# Patient Record
Sex: Male | Born: 1935 | Race: White | Hispanic: No | Marital: Married | Smoking: Never smoker
Health system: Southern US, Community
[De-identification: ages and names within clinical notes are randomized; demographics above are authoritative.]

## PROBLEM LIST (undated history)

## (undated) DIAGNOSIS — R001 Bradycardia, unspecified: Secondary | ICD-10-CM

## (undated) DIAGNOSIS — I10 Essential (primary) hypertension: Secondary | ICD-10-CM

## (undated) DIAGNOSIS — N400 Enlarged prostate without lower urinary tract symptoms: Secondary | ICD-10-CM

## (undated) DIAGNOSIS — I2510 Atherosclerotic heart disease of native coronary artery without angina pectoris: Secondary | ICD-10-CM

## (undated) DIAGNOSIS — I493 Ventricular premature depolarization: Secondary | ICD-10-CM

## (undated) DIAGNOSIS — E039 Hypothyroidism, unspecified: Secondary | ICD-10-CM

## (undated) DIAGNOSIS — R7303 Prediabetes: Secondary | ICD-10-CM

## (undated) DIAGNOSIS — E785 Hyperlipidemia, unspecified: Secondary | ICD-10-CM

## (undated) DIAGNOSIS — E7800 Pure hypercholesterolemia, unspecified: Secondary | ICD-10-CM

## (undated) DIAGNOSIS — R002 Palpitations: Secondary | ICD-10-CM

## (undated) DIAGNOSIS — I255 Ischemic cardiomyopathy: Secondary | ICD-10-CM

## (undated) DIAGNOSIS — I509 Heart failure, unspecified: Secondary | ICD-10-CM

## (undated) DIAGNOSIS — I639 Cerebral infarction, unspecified: Secondary | ICD-10-CM

## (undated) DIAGNOSIS — I25110 Atherosclerotic heart disease of native coronary artery with unstable angina pectoris: Secondary | ICD-10-CM

## (undated) DIAGNOSIS — I208 Other forms of angina pectoris: Secondary | ICD-10-CM

## (undated) DIAGNOSIS — Z8673 Personal history of transient ischemic attack (TIA), and cerebral infarction without residual deficits: Secondary | ICD-10-CM

## (undated) DIAGNOSIS — I63549 Cerebral infarction due to unspecified occlusion or stenosis of unspecified cerebellar artery: Secondary | ICD-10-CM

## (undated) DIAGNOSIS — I2089 Other forms of angina pectoris: Secondary | ICD-10-CM

## (undated) DIAGNOSIS — R972 Elevated prostate specific antigen [PSA]: Secondary | ICD-10-CM

## (undated) HISTORY — DX: Cerebral infarction, unspecified: I63.9

## (undated) HISTORY — DX: Atherosclerotic heart disease of native coronary artery with unstable angina pectoris: I25.110

## (undated) HISTORY — DX: Prediabetes: R73.03

## (undated) HISTORY — DX: Personal history of transient ischemic attack (TIA), and cerebral infarction without residual deficits: Z86.73

## (undated) HISTORY — DX: Hyperlipidemia, unspecified: E78.5

## (undated) HISTORY — DX: Elevated prostate specific antigen (PSA): R97.20

## (undated) HISTORY — DX: Pure hypercholesterolemia, unspecified: E78.00

## (undated) HISTORY — DX: Bradycardia, unspecified: R00.1

## (undated) HISTORY — DX: Other forms of angina pectoris: I20.89

## (undated) HISTORY — DX: Benign prostatic hyperplasia without lower urinary tract symptoms: N40.0

## (undated) HISTORY — DX: Heart failure, unspecified: I50.9

## (undated) HISTORY — DX: Atherosclerotic heart disease of native coronary artery without angina pectoris: I25.10

## (undated) HISTORY — DX: Palpitations: R00.2

## (undated) HISTORY — DX: Essential (primary) hypertension: I10

## (undated) HISTORY — DX: Ischemic cardiomyopathy: I25.5

## (undated) HISTORY — DX: Hypothyroidism, unspecified: E03.9

## (undated) HISTORY — DX: Ventricular premature depolarization: I49.3

## (undated) HISTORY — DX: Other forms of angina pectoris: I20.8

## (undated) HISTORY — DX: Cerebral infarction due to unspecified occlusion or stenosis of unspecified cerebellar artery: I63.549

---

## 1992-06-22 HISTORY — PX: HERNIA REPAIR: SHX51

## 2007-09-23 DIAGNOSIS — I639 Cerebral infarction, unspecified: Secondary | ICD-10-CM

## 2007-09-23 HISTORY — DX: Cerebral infarction, unspecified: I63.9

## 2009-10-06 ENCOUNTER — Inpatient Hospital Stay (HOSPITAL_COMMUNITY): Admission: EM | Admit: 2009-10-06 | Discharge: 2009-10-08 | Payer: Self-pay | Admitting: Emergency Medicine

## 2009-10-08 ENCOUNTER — Encounter (INDEPENDENT_AMBULATORY_CARE_PROVIDER_SITE_OTHER): Payer: Self-pay | Admitting: Internal Medicine

## 2010-12-08 LAB — URINE MICROSCOPIC-ADD ON

## 2010-12-08 LAB — DIFFERENTIAL
Basophils Absolute: 0 10*3/uL (ref 0.0–0.1)
Eosinophils Absolute: 0 10*3/uL (ref 0.0–0.7)
Eosinophils Relative: 0 % (ref 0–5)
Monocytes Absolute: 0.5 10*3/uL (ref 0.1–1.0)
Monocytes Relative: 4 % (ref 3–12)

## 2010-12-08 LAB — CBC
HCT: 43.7 % (ref 39.0–52.0)
Hemoglobin: 15.1 g/dL (ref 13.0–17.0)
MCHC: 34.5 g/dL (ref 30.0–36.0)
RBC: 4.9 MIL/uL (ref 4.22–5.81)
RDW: 13.4 % (ref 11.5–15.5)
WBC: 12.9 10*3/uL — ABNORMAL HIGH (ref 4.0–10.5)

## 2010-12-08 LAB — URINALYSIS, ROUTINE W REFLEX MICROSCOPIC
Glucose, UA: 100 mg/dL — AB
Ketones, ur: 15 mg/dL — AB
Nitrite: POSITIVE — AB
Protein, ur: 100 mg/dL — AB
Specific Gravity, Urine: 1.017 (ref 1.005–1.030)

## 2010-12-08 LAB — COMPREHENSIVE METABOLIC PANEL
ALT: 26 U/L (ref 0–53)
CO2: 26 mEq/L (ref 19–32)
Calcium: 8.7 mg/dL (ref 8.4–10.5)
Creatinine, Ser: 1.13 mg/dL (ref 0.4–1.5)
GFR calc Af Amer: >60 mL/min (ref >60)
Glucose, Bld: 106 mg/dL — ABNORMAL HIGH (ref 70–99)
Total Protein: 6.9 g/dL (ref 6.0–8.3)

## 2010-12-08 LAB — CULTURE, BLOOD (ROUTINE X 2): Culture: NO GROWTH

## 2010-12-08 LAB — URINE CULTURE

## 2010-12-08 LAB — LACTIC ACID, PLASMA: Lactic Acid, Venous: 1.7 mmol/L (ref 0.5–2.2)

## 2010-12-08 LAB — LIPASE, BLOOD: Lipase: 25 U/L (ref 11–59)

## 2010-12-08 LAB — APTT: aPTT: 27 seconds (ref 24–37)

## 2010-12-08 LAB — TYPE AND SCREEN
ABO/RH(D): O POS
Antibody Screen: NEGATIVE

## 2010-12-08 LAB — PROTIME-INR: INR: 1.08 (ref 0.00–1.49)

## 2010-12-08 LAB — POCT CARDIAC MARKERS

## 2010-12-09 LAB — CBC
HCT: 41.6 % (ref 39.0–52.0)
Hemoglobin: 12.6 g/dL — ABNORMAL LOW (ref 13.0–17.0)
Hemoglobin: 13 g/dL (ref 13.0–17.0)
Hemoglobin: 13.2 g/dL (ref 13.0–17.0)
MCHC: 33.6 g/dL (ref 30.0–36.0)
MCHC: 34 g/dL (ref 30.0–36.0)
MCHC: 34.2 g/dL (ref 30.0–36.0)
MCV: 89.4 fL (ref 78.0–100.0)
MCV: 89.9 fL (ref 78.0–100.0)
MCV: 90.3 fL (ref 78.0–100.0)
Platelets: 115 10*3/uL — ABNORMAL LOW (ref 150–400)
Platelets: 127 10*3/uL — ABNORMAL LOW (ref 150–400)
Platelets: 130 10*3/uL — ABNORMAL LOW (ref 150–400)
Platelets: 131 10*3/uL — ABNORMAL LOW (ref 150–400)
RBC: 4.61 MIL/uL (ref 4.22–5.81)
RDW: 13.9 % (ref 11.5–15.5)
RDW: 14.1 % (ref 11.5–15.5)
WBC: 4.3 10*3/uL (ref 4.0–10.5)

## 2010-12-09 LAB — BASIC METABOLIC PANEL
BUN: 12 mg/dL (ref 6–23)
Calcium: 7.9 mg/dL — ABNORMAL LOW (ref 8.4–10.5)
Creatinine, Ser: 1.13 mg/dL (ref 0.4–1.5)
GFR calc Af Amer: >60 mL/min (ref >60)
Glucose, Bld: 106 mg/dL — ABNORMAL HIGH (ref 70–99)
Potassium: 3.9 mEq/L (ref 3.5–5.1)

## 2010-12-20 ENCOUNTER — Encounter: Payer: Self-pay | Admitting: Family Medicine

## 2010-12-20 ENCOUNTER — Ambulatory Visit (INDEPENDENT_AMBULATORY_CARE_PROVIDER_SITE_OTHER): Payer: Medicare Other | Admitting: Family Medicine

## 2010-12-20 DIAGNOSIS — I10 Essential (primary) hypertension: Secondary | ICD-10-CM

## 2010-12-20 DIAGNOSIS — Z8673 Personal history of transient ischemic attack (TIA), and cerebral infarction without residual deficits: Secondary | ICD-10-CM | POA: Insufficient documentation

## 2010-12-20 DIAGNOSIS — I499 Cardiac arrhythmia, unspecified: Secondary | ICD-10-CM

## 2010-12-20 DIAGNOSIS — R030 Elevated blood-pressure reading, without diagnosis of hypertension: Secondary | ICD-10-CM

## 2010-12-20 DIAGNOSIS — L0100 Impetigo, unspecified: Secondary | ICD-10-CM

## 2010-12-20 HISTORY — DX: Personal history of transient ischemic attack (TIA), and cerebral infarction without residual deficits: Z86.73

## 2010-12-20 HISTORY — DX: Essential (primary) hypertension: I10

## 2010-12-20 LAB — LIPID PANEL
Cholesterol: 199 mg/dL (ref 0–200)
Total CHOL/HDL Ratio: 5 Ratio

## 2010-12-20 MED ORDER — ASPIRIN EC 81 MG PO TBEC: 81.0000 mg | DELAYED_RELEASE_TABLET | Freq: Every day | ORAL | Status: AC

## 2010-12-20 MED ORDER — MUPIROCIN 2 % EX OINT: TOPICAL_OINTMENT | CUTANEOUS | Status: DC

## 2010-12-20 NOTE — Patient Instructions (Addendum)
Take you BP at home multiple times and record.  Bring in to me next visit.  Goal is to average 130/80 or less on home BPs I will call with lab results Start taking one aspirin daily. See me soon for complete physical (and story about carotids).  Make sure you bring in your BP readings.

## 2010-12-20 NOTE — Progress Notes (Signed)
  Subjective:    Patient ID: Patrick Maxwell, male    DOB: 04-May-1931, 77 y.o.   MRN: [id number]  HPI Here to establish care with new MD.   Very light stroke 3 years ago.  Little to no residual.  Perhaps some memory problems.  Did have some speech effected but now close to normal.   Cholesterol historically good.  It has been over one year.     Review of Systems Denies chest pain, SOB, syncope, or new neuro deficits.  No unusual wt change.  No bleeding.  Does have irritated skin lesion on neck.  Also C/O rare mild palpitations without syncope or lightheadedness.     Objective:   Physical Exam Neck lesion, small area uncertain primary lesion but now looks secondarily infected. Neck otherwise normal Lungs clear Cardiac RRR without m or g Abd benign Ext no edema       Assessment & Plan:

## 2010-12-23 ENCOUNTER — Encounter: Payer: Self-pay | Admitting: Family Medicine

## 2010-12-24 ENCOUNTER — Encounter: Payer: Self-pay | Admitting: Family Medicine

## 2010-12-24 NOTE — Assessment & Plan Note (Signed)
Should be on ASA daily.  Also, goal LDL<100.  Will check lipid panel.

## 2010-12-24 NOTE — Assessment & Plan Note (Signed)
Doubt sig. Will check TSH.

## 2010-12-24 NOTE — Assessment & Plan Note (Signed)
Mild neck skin infection.  Rx with bactroban.

## 2010-12-24 NOTE — Assessment & Plan Note (Signed)
Has hx of white coat hypertension.  Also concerned with hx of CVA.  Will monitor home BPs x 2 weeks and return to decide if needs medications.  I suspect that he will.

## 2011-01-17 ENCOUNTER — Encounter: Payer: Self-pay | Admitting: Family Medicine

## 2011-01-17 ENCOUNTER — Ambulatory Visit (INDEPENDENT_AMBULATORY_CARE_PROVIDER_SITE_OTHER): Payer: Medicare Other | Admitting: Family Medicine

## 2011-01-17 VITALS — BP 170/82 | HR 68 | Temp 97.8°F | Ht 68.0 in | Wt 208.0 lb

## 2011-01-17 DIAGNOSIS — Z8673 Personal history of transient ischemic attack (TIA), and cerebral infarction without residual deficits: Secondary | ICD-10-CM

## 2011-01-17 DIAGNOSIS — E7800 Pure hypercholesterolemia, unspecified: Secondary | ICD-10-CM | POA: Insufficient documentation

## 2011-01-17 DIAGNOSIS — I10 Essential (primary) hypertension: Secondary | ICD-10-CM

## 2011-01-17 HISTORY — DX: Pure hypercholesterolemia, unspecified: E78.00

## 2011-01-17 MED ORDER — HYDROCHLOROTHIAZIDE 12.5 MG PO TABS: 12.5000 mg | ORAL_TABLET | Freq: Every day | ORAL | Status: DC

## 2011-01-17 NOTE — Assessment & Plan Note (Signed)
Will check in Aug after 3 months on red yeast rice.  Also, check LFTs for possible start on statin.

## 2011-01-17 NOTE — Progress Notes (Signed)
  Subjective:    Patient ID: Patrick Maxwell, male    DOB: 1930/12/05, 77 y.o.   MRN: [id number]  HPI Brings in home BPs, the majority of which are over 130 systolic.   Has high cholesterl.  Does not want statin.  Started on red yeast rice.  I will recheck in 3 months.     Review of Systems     Objective:   Physical Exam  Gen normal BP noted. Lungs clear.        Assessment & Plan:

## 2011-01-17 NOTE — Assessment & Plan Note (Signed)
Having trouble tolerating ASA (tinitis) Will try low dose.

## 2011-01-17 NOTE — Assessment & Plan Note (Signed)
Clearly meets criteria for treatment.

## 2013-01-10 ENCOUNTER — Encounter: Payer: Self-pay | Admitting: Family Medicine

## 2014-02-01 DIAGNOSIS — J209 Acute bronchitis, unspecified: Secondary | ICD-10-CM | POA: Diagnosis not present

## 2014-05-16 DIAGNOSIS — I10 Essential (primary) hypertension: Secondary | ICD-10-CM | POA: Diagnosis not present

## 2014-08-29 DIAGNOSIS — H2513 Age-related nuclear cataract, bilateral: Secondary | ICD-10-CM | POA: Diagnosis not present

## 2015-01-13 DIAGNOSIS — J209 Acute bronchitis, unspecified: Secondary | ICD-10-CM | POA: Diagnosis not present

## 2015-06-25 ENCOUNTER — Encounter: Payer: Self-pay | Admitting: Family Medicine

## 2015-06-28 ENCOUNTER — Ambulatory Visit (INDEPENDENT_AMBULATORY_CARE_PROVIDER_SITE_OTHER): Payer: Medicare Other | Admitting: Physician Assistant

## 2015-06-28 ENCOUNTER — Encounter: Payer: Self-pay | Admitting: Physician Assistant

## 2015-06-28 VITALS — BP 154/106 | HR 68 | Temp 98.1°F | Resp 18 | Ht 68.75 in | Wt 213.0 lb

## 2015-06-28 DIAGNOSIS — I10 Essential (primary) hypertension: Secondary | ICD-10-CM | POA: Diagnosis not present

## 2015-06-28 DIAGNOSIS — E7800 Pure hypercholesterolemia, unspecified: Secondary | ICD-10-CM | POA: Diagnosis not present

## 2015-06-28 DIAGNOSIS — Z8673 Personal history of transient ischemic attack (TIA), and cerebral infarction without residual deficits: Secondary | ICD-10-CM | POA: Diagnosis not present

## 2015-06-28 MED ORDER — ATENOLOL 25 MG PO TABS: 25.0000 mg | ORAL_TABLET | Freq: Every day | ORAL | Status: DC

## 2015-06-28 NOTE — Progress Notes (Addendum)
Patient ID: Patrick Maxwell MRN: [id number], DOB: [birth year], 81 y.o. Date of Encounter: @DATE @  Chief Complaint:  Chief Complaint  Patient presents with  . new pt est care    needs meds refilled    HPI: 81 y.o. year old white male  presents as a new patient to establish care.  -----Addendum Added 12/31/15--I received records from [facility] [place]. No additional pertinent medical information obtained. -----Records sent to scan. -------   He states that he and his wife just recently moved here from [place]. Therefore establishing as a new patient to our office.  He looks much younger than stated age. He is very pleasant. He states that he usually doesn't do physicals, doesn't like going to doctors, doesn't like to take any more medicines than he absolutely has to. Says that he eats healthy and stays very active--says that he climbs and works around the house and walks-- keeps very active. Says we're all going to die at some point. Says that his brother just recently passed away at age 39 and he was the same way and did not go to doctors etc.  His atenolol ran out a few days ago. Says he has a history of whitecoat hypertension. Says he checks his blood pressure at home and gets 130/75 when taking his atenolol.  He has no complaints or concerns today.   Past Medical History  Diagnosis Date  . Irregular heart beat     never specifically diagnosed.  Palpitations since early 10s  . Stroke ([place])   . Hypertension   . Hyperlipidemia      Home Meds: Outpatient Prescriptions Prior to Visit  Medication Sig Dispense Refill  . atenolol (TENORMIN) 25 MG tablet Take 25 mg by mouth daily.    . hydrochlorothiazide (HYDRODIURIL) 12.5 MG tablet Take 1 tablet (12.5 mg total) by mouth daily. 90 tablet 3   No facility-administered medications prior to visit.    Allergies: No Known Allergies  Social History   Social History  . Marital Status: Married    Spouse  Name: N/A  . Number of Children: N/A  . Years of Education: N/A   Occupational History  . Not on file.   Social History Main Topics  . Smoking status: Never Smoker   . Smokeless tobacco: Never Used  . Alcohol Use: No  . Drug Use: No  . Sexual Activity: Not Currently   Other Topics Concern  . Not on file   Social History Narrative    Family History  Problem Relation Age of Onset  . Stroke Mother   . Early death Mother   . Cancer Brother      Review of Systems:  See HPI for pertinent ROS. All other ROS negative.    Physical Exam: Blood pressure 154/106, pulse 68, temperature 98.1 F (36.7 C), temperature source Oral, resp. rate 18, height 5' 8.75" (1.746 m), weight 213 lb (96.616 kg)., Body mass index is 31.69 kg/(m^2). General: WNWD WM. Appears much younger than stated age. Appears in no acute distress. Neck: Supple. No thyromegaly. No lymphadenopathy. No carotid bruits. Lungs: Clear bilaterally to auscultation without wheezes, rales, or rhonchi. Breathing is unlabored. Heart: RRR with S1 S2. No murmurs, rubs, or gallops. Musculoskeletal:  Strength and tone normal for age. Extremities/Skin: Warm and dry. No edema.  Neuro: Alert and oriented X 3. Moves all extremities spontaneously. Gait is normal. CNII-XII grossly in tact. Psych:  Responds to questions appropriately with a normal affect.  ASSESSMENT AND PLAN:  81 y.o. year old male with  1. Hypertension, benign - atenolol (TENORMIN) 25 MG tablet; Take 1 tablet (25 mg total) by mouth daily.  Dispense: 90 tablet; Refill: 3  2. Hypercholesteremia  3. History of CVA (cerebrovascular accident) without residual deficits  I told him that if he decided to schedule a complete physical exam he can call and do so at any time. Otherwise if his blood pressure remains controlled and he is feeling well he can wait one year for follow-up visit.   Signed, [address] [place], [place], [facility] 06/28/2015 10:45 AM

## 2015-12-19 ENCOUNTER — Other Ambulatory Visit: Payer: Self-pay | Admitting: Physician Assistant

## 2015-12-19 ENCOUNTER — Encounter: Payer: Self-pay | Admitting: Physician Assistant

## 2015-12-19 ENCOUNTER — Ambulatory Visit (INDEPENDENT_AMBULATORY_CARE_PROVIDER_SITE_OTHER): Payer: Medicare Other | Admitting: Physician Assistant

## 2015-12-19 VITALS — BP 162/98 | HR 56 | Temp 97.2°F | Resp 18 | Wt 214.0 lb

## 2015-12-19 DIAGNOSIS — R0602 Shortness of breath: Secondary | ICD-10-CM

## 2015-12-19 DIAGNOSIS — E038 Other specified hypothyroidism: Secondary | ICD-10-CM | POA: Diagnosis not present

## 2015-12-20 LAB — CBC WITH DIFFERENTIAL/PLATELET
BASOS PCT: 0 % (ref 0–1)
Basophils Absolute: 0 10*3/uL (ref 0.0–0.1)
Eosinophils Absolute: 0.2 10*3/uL (ref 0.0–0.7)
Eosinophils Relative: 3 % (ref 0–5)
HCT: 45.5 % (ref 39.0–52.0)
HEMOGLOBIN: 15.4 g/dL (ref 13.0–17.0)
Lymphocytes Relative: 22 % (ref 12–46)
Lymphs Abs: 1.6 10*3/uL (ref 0.7–4.0)
MCH: 30.1 pg (ref 26.0–34.0)
MCHC: 33.8 g/dL (ref 30.0–36.0)
MCV: 88.9 fL (ref 78.0–100.0)
MONO ABS: 0.7 10*3/uL (ref 0.1–1.0)
MPV: 12 fL (ref 8.6–12.4)
Monocytes Relative: 10 % (ref 3–12)
NEUTROS ABS: 4.7 10*3/uL (ref 1.7–7.7)
NEUTROS PCT: 65 % (ref 43–77)
PLATELETS: 183 10*3/uL (ref 150–400)
RBC: 5.12 MIL/uL (ref 4.22–5.81)
RDW: 13.8 % (ref 11.5–15.5)
WBC: 7.2 10*3/uL (ref 4.0–10.5)

## 2015-12-20 LAB — COMPLETE METABOLIC PANEL WITH GFR
ALBUMIN: 4.3 g/dL (ref 3.6–5.1)
ALK PHOS: 68 U/L (ref 40–115)
ALT: 27 U/L (ref 9–46)
AST: 26 U/L (ref 10–35)
BUN: 17 mg/dL (ref 7–25)
CHLORIDE: 101 mmol/L (ref 98–110)
CO2: 24 mmol/L (ref 20–31)
Calcium: 9 mg/dL (ref 8.6–10.3)
Creat: 1.1 mg/dL (ref 0.70–1.11)
GFR, EST NON AFRICAN AMERICAN: 62 mL/min (ref >=60)
GFR, Est African American: 72 mL/min (ref >=60)
GLUCOSE: 97 mg/dL (ref 70–99)
POTASSIUM: 4.6 mmol/L (ref 3.5–5.3)
SODIUM: 142 mmol/L (ref 135–146)
Total Bilirubin: 0.7 mg/dL (ref 0.2–1.2)
Total Protein: 6.8 g/dL (ref 6.1–8.1)

## 2015-12-20 LAB — BRAIN NATRIURETIC PEPTIDE: Brain Natriuretic Peptide: 606.5 pg/mL — ABNORMAL HIGH (ref <100)

## 2015-12-20 LAB — T4, FREE: FREE T4: 1.2 ng/dL (ref 0.8–1.8)

## 2015-12-20 LAB — TSH: TSH: 4.57 mIU/L — ABNORMAL HIGH (ref 0.40–4.50)

## 2015-12-20 NOTE — Progress Notes (Signed)
Patient ID: Patrick Maxwell MRN: [id number], DOB: [birth year], 82 y.o. Date of Encounter: @DATE @  Chief Complaint:  Chief Complaint  Patient presents with  . feeling SOB/winded, fatigue    HPI: 82 y.o. year old white male  Presents with above.   THE FOLLOWING IS COPIED FROM MY 06/28/2015 OV NOTE:  Presents as a new patient to establish care.  He states that he and his wife just recently moved here from [place]. Therefore establishing as a new patient to our office.  He looks much younger than stated age. He is very pleasant. He states that he usually doesn't do physicals, doesn't like going to doctors, doesn't like to take any more medicines than he absolutely has to. Says that he eats healthy and stays very active--says that he climbs and works around the house and walks-- keeps very active. Says we're all going to die at some point. Says that his brother just recently passed away at age 66 and he was the same way and did not go to doctors etc.  His atenolol ran out a few days ago. Says he has a history of whitecoat hypertension. Says he checks his blood pressure at home and gets 130/75 when taking his atenolol.  He has no complaints or concerns today.   12/19/2015:  He reports that he has been having episodes of feeling short of breath over the past 2-3 weeks.  Says it only happens intermittently / off and on.  Says that he really only notices it when he is sitting at rest.  Never feels these symptoms when doing physical exertion.  States that he "stays outside all the time--- setting out plants mowing the yard" etc.  Says he is not feeling any increased amount of shortness of breath when he is doing this physical exertion. Also states that he has felt no chest pressure chest heaviness tightness or squeezing. Asked if he feels his heart racing at the times of the shortness of breath and his response is "I've had irregular heartbeat for years-- it will beat, beat, skip,  beat, beat, skip--- years ago it would skip every third beat" Later in the evaluation he says that he did wear a monitor in the past when he was living in [place]. He states that he is not feeling any increased palpitations or skips or rapid heartbeats.  Has had no paroxysmal nocturnal dyspnea and no orthopnea.  Discussed his blood pressure is high today and he says that he checks it at home and it is good and when he checks it at the doctors it is always high and that is always been like this. Very reluctant to adding medicines for her blood pressure and is quite adamant that it is normal at home.   Past Medical History  Diagnosis Date  . Irregular heart beat     never specifically diagnosed.  Palpitations since early 8s  . Stroke ([place])   . Hypertension   . Hyperlipidemia      Home Meds: Outpatient Prescriptions Prior to Visit  Medication Sig Dispense Refill  . Ascorbic Acid (VITAMIN C PO) Take by mouth.    Marland Kitchen atenolol (TENORMIN) 25 MG tablet Take 1 tablet (25 mg total) by mouth daily. 90 tablet 3  . Coenzyme Q10 (CO Q 10 PO) Take by mouth.     No facility-administered medications prior to visit.    Allergies: No Known Allergies  Social History   Social History  . Marital Status: Married  Spouse Name: N/A  . Number of Children: N/A  . Years of Education: N/A   Occupational History  . Not on file.   Social History Main Topics  . Smoking status: Never Smoker   . Smokeless tobacco: Never Used  . Alcohol Use: No  . Drug Use: No  . Sexual Activity: Not Currently   Other Topics Concern  . Not on file   Social History Narrative    Family History  Problem Relation Age of Onset  . Stroke Mother   . Early death Mother   . Cancer Brother      Review of Systems:  See HPI for pertinent ROS. All other ROS negative.    Physical Exam: Blood pressure 162/98, pulse 56, temperature 97.2 F (36.2 C), temperature source Oral, resp. rate 18, weight 214 lb (97.07 kg).,  Body mass index is 31.84 kg/(m^2). General: WNWD WM. Appears much younger than stated age. Appears in no acute distress. Neck: Supple. No thyromegaly. No lymphadenopathy. No carotid bruits. Lungs: Clear bilaterally to auscultation without wheezes, rales, or rhonchi. Breathing is unlabored. Heart: RRR with frequent ectopy. No murmurs, rubs. Musculoskeletal:  Strength and tone normal for age. Extremities/Skin: Warm and dry. No edema.  Neuro: Alert and oriented X 3. Moves all extremities spontaneously. Gait is normal. CNII-XII grossly in tact. Psych:  Responds to questions appropriately with a normal affect.   EKG shows Sinus Rhythm with Frequent PVCs  ASSESSMENT AND PLAN:  82 y.o. year old male with   1. Shortness of breath EKG obtained. Will obtain chest x-ray and labs.  Will refer to Cardiology for follow-up. Given that his symptoms occur at rest I suspect that the shortness of breath is secondary to ectopy/arrhythmia. He reports that he wore a monitor in the past when living in [place].  - DG Chest 2 View; Future - CBC with Differential/Platelet - COMPLETE METABOLIC PANEL WITH GFR - TSH - Brain natriuretic peptide - Ambulatory referral to Cardiology - EKG 12-Lead    Hypertension, benign Him very concerned that his blood pressure is so high at his office visits. However patient very adamant the blood pressure normal at home and that this is only whitecoat hypertension and does not want to add further medications. For now will continue the atenolol and have him follow-up with cardiology. - atenolol (TENORMIN) 25 MG tablet; Take 1 tablet (25 mg total) by mouth daily.   3. History of CVA (cerebrovascular accident) without residual deficits    Signed, [address] [place], [place], [facility] 12/20/2015 7:56 AM

## 2015-12-21 ENCOUNTER — Encounter: Payer: Self-pay | Admitting: Family Medicine

## 2015-12-21 ENCOUNTER — Ambulatory Visit
Admission: RE | Admit: 2015-12-21 | Discharge: 2015-12-21 | Disposition: A | Discharge status: Home / Self Care | Payer: Medicare Other | Source: Ambulatory Visit | Attending: Physician Assistant | Admitting: Physician Assistant

## 2015-12-21 ENCOUNTER — Other Ambulatory Visit: Payer: Self-pay | Admitting: Family Medicine

## 2015-12-21 DIAGNOSIS — E038 Other specified hypothyroidism: Secondary | ICD-10-CM

## 2015-12-21 DIAGNOSIS — R0602 Shortness of breath: Secondary | ICD-10-CM

## 2015-12-21 DIAGNOSIS — E039 Hypothyroidism, unspecified: Secondary | ICD-10-CM

## 2015-12-21 HISTORY — DX: Other specified hypothyroidism: E03.8

## 2015-12-21 MED ORDER — POTASSIUM CHLORIDE CRYS ER 20 MEQ PO TBCR: 20.0000 meq | EXTENDED_RELEASE_TABLET | Freq: Every day | ORAL | Status: DC

## 2015-12-21 MED ORDER — FUROSEMIDE 20 MG PO TABS: 20.0000 mg | ORAL_TABLET | Freq: Every day | ORAL | Status: DC

## 2015-12-25 ENCOUNTER — Ambulatory Visit (INDEPENDENT_AMBULATORY_CARE_PROVIDER_SITE_OTHER): Payer: Medicare Other | Admitting: Cardiovascular Disease

## 2015-12-25 ENCOUNTER — Encounter: Payer: Self-pay | Admitting: Cardiovascular Disease

## 2015-12-25 VITALS — BP 150/88 | HR 90 | Ht 69.0 in | Wt 213.0 lb

## 2015-12-25 DIAGNOSIS — R0602 Shortness of breath: Secondary | ICD-10-CM

## 2015-12-25 DIAGNOSIS — I10 Essential (primary) hypertension: Secondary | ICD-10-CM | POA: Diagnosis not present

## 2015-12-25 DIAGNOSIS — Z8673 Personal history of transient ischemic attack (TIA), and cerebral infarction without residual deficits: Secondary | ICD-10-CM | POA: Diagnosis not present

## 2015-12-25 DIAGNOSIS — R002 Palpitations: Secondary | ICD-10-CM | POA: Diagnosis not present

## 2015-12-25 HISTORY — DX: Palpitations: R00.2

## 2015-12-25 NOTE — Assessment & Plan Note (Signed)
History of hyperlipidemia on red yeast rice followed by his PCP 

## 2015-12-25 NOTE — Assessment & Plan Note (Signed)
History of palpitations with ABCs on his 12-lead cardiogram. He is on a beta blocker. He has had no  History of syncope or presyncope. We will check a 2-D echocardiogram

## 2015-12-25 NOTE — Patient Instructions (Signed)
Your physician wants you to follow-up in: 1 Year. You will receive a reminder letter in the mail two months in advance. If you don't receive a letter, please call our office to schedule the follow-up appointment.  Your physician has requested that you have an echocardiogram. Echocardiography is a painless test that uses sound waves to create images of your heart. It provides your doctor with information about the size and shape of your heart and how well your heart's chambers and valves are working. This procedure takes approximately one hour. There are no restrictions for this procedure.  Your physician has requested that you have a carotid duplex. This test is an ultrasound of the carotid arteries in your neck. It looks at blood flow through these arteries that supply the brain with blood. Allow one hour for this exam. There are no restrictions or special instructions.         

## 2015-12-25 NOTE — Progress Notes (Signed)
12/25/2015 Patrick Maxwell   [birth year]  [id number]  Primary Physician Patrick Juba, PA-C Primary Cardiologist: Lorretta Harp MD Renae Gloss   HPI:  [name] is an 82 year old mildly overweight married Caucasian male who is accompanied by his wife Patrick Maxwell. He has 9 children and 30 grandchildren. He is a retired Administrator. He was referred by Patrick Maxwell for cardiovascular evaluation and to be established in our practice. He has no prior cardiac history. He has had a stroke in November 2009 without residual. He has a negative cath at [facility] December 1999. His risk factors include hypertension and hyperlipidemia. He does have palpitations with PVCs on beta-blockade.   Current Outpatient Prescriptions  Medication Sig Dispense Refill  . Ascorbic Acid (VITAMIN C PO) Take by mouth.    Marland Kitchen atenolol (TENORMIN) 25 MG tablet Take 1 tablet (25 mg total) by mouth daily. 90 tablet 3  . Coenzyme Q10 (CO Q 10 PO) Take by mouth.    . furosemide (LASIX) 20 MG tablet Take 1 tablet (20 mg total) by mouth daily. X 3 day only 3 tablet 0  . Magnesium 100 MG TABS Take 1 tablet by mouth 2 (two) times daily.    . potassium chloride SA (K-DUR,KLOR-CON) 20 MEQ tablet Take 1 tablet (20 mEq total) by mouth daily. X 3 days only along with furosemide 3 tablet 0   No current facility-administered medications for this visit.    No Known Allergies  Social History   Social History  . Marital Status: Married    Spouse Name: N/A  . Number of Children: N/A  . Years of Education: N/A   Occupational History  . Not on file.   Social History Main Topics  . Smoking status: Never Smoker   . Smokeless tobacco: Never Used  . Alcohol Use: No  . Drug Use: No  . Sexual Activity: Not Currently   Other Topics Concern  . Not on file   Social History Narrative     Review of Systems: General: negative for chills, fever, night sweats or weight changes.  Cardiovascular: negative  for chest pain, dyspnea on exertion, edema, orthopnea, palpitations, paroxysmal nocturnal dyspnea or shortness of breath Dermatological: negative for rash Respiratory: negative for cough or wheezing Urologic: negative for hematuria Abdominal: negative for nausea, vomiting, diarrhea, bright red blood per rectum, melena, or hematemesis Neurologic: negative for visual changes, syncope, or dizziness All other systems reviewed and are otherwise negative except as noted above.    Blood pressure 150/88, pulse 90, height 5\' 9"  (1.753 m), weight 213 lb (96.616 kg).  General appearance: alert and no distress Neck: no adenopathy, no carotid bruit, no JVD, supple, symmetrical, trachea midline and thyroid not enlarged, symmetric, no tenderness/mass/nodules Lungs: clear to auscultation bilaterally Heart: regular rate and rhythm, S1, S2 normal, no murmur, click, rub or gallop Extremities: extremities normal, atraumatic, no cyanosis or edema  EKG normal sinus rhythm at 90 with frequent PVCs. I personally reviewed this EKG  ASSESSMENT AND PLAN:   Hypertension, benign History of hypertension with blood pressure measured at 150/88. He is on atenolol. Continue current meds at current dosing  Hypercholesteremia History of hyperlipidemia on red yeast rice followed by his PCP  Palpitations History of palpitations with ABCs on his 12-lead cardiogram. He is on a beta blocker. He has had no  History of syncope or presyncope. We will check a 2-D echocardiogram      Lorretta Harp MD [facility] [place], [facility] 12/25/2015  11:42 AM

## 2015-12-25 NOTE — Assessment & Plan Note (Signed)
History of hypertension with blood pressure measured at 150/88. He is on atenolol. Continue current meds at current dosing

## 2016-01-07 ENCOUNTER — Ambulatory Visit (HOSPITAL_COMMUNITY)
Admission: RE | Admit: 2016-01-07 | Discharge: 2016-01-07 | Disposition: A | Discharge status: Home / Self Care | Payer: Medicare Other | Source: Ambulatory Visit | Attending: Cardiology | Admitting: Cardiology

## 2016-01-07 ENCOUNTER — Ambulatory Visit (HOSPITAL_BASED_OUTPATIENT_CLINIC_OR_DEPARTMENT_OTHER)
Admission: RE | Admit: 2016-01-07 | Discharge: 2016-01-07 | Disposition: A | Discharge status: Home / Self Care | Payer: Medicare Other | Source: Ambulatory Visit | Attending: Cardiology | Admitting: Cardiology

## 2016-01-07 DIAGNOSIS — R0602 Shortness of breath: Secondary | ICD-10-CM | POA: Diagnosis not present

## 2016-01-07 DIAGNOSIS — Z8673 Personal history of transient ischemic attack (TIA), and cerebral infarction without residual deficits: Secondary | ICD-10-CM | POA: Diagnosis not present

## 2016-01-07 DIAGNOSIS — I351 Nonrheumatic aortic (valve) insufficiency: Secondary | ICD-10-CM | POA: Insufficient documentation

## 2016-01-07 DIAGNOSIS — E785 Hyperlipidemia, unspecified: Secondary | ICD-10-CM | POA: Insufficient documentation

## 2016-01-07 DIAGNOSIS — R002 Palpitations: Secondary | ICD-10-CM | POA: Diagnosis not present

## 2016-01-07 DIAGNOSIS — I340 Nonrheumatic mitral (valve) insufficiency: Secondary | ICD-10-CM | POA: Insufficient documentation

## 2016-01-07 DIAGNOSIS — I6523 Occlusion and stenosis of bilateral carotid arteries: Secondary | ICD-10-CM | POA: Insufficient documentation

## 2016-01-07 DIAGNOSIS — I119 Hypertensive heart disease without heart failure: Secondary | ICD-10-CM | POA: Diagnosis not present

## 2016-01-16 ENCOUNTER — Telehealth: Payer: Self-pay

## 2016-01-16 NOTE — Telephone Encounter (Signed)
-----   Message from Lorretta Harp, MD sent at 01/13/2016  3:19 PM EDT ----- Return office visit with me to discuss results at next available

## 2016-01-16 NOTE — Telephone Encounter (Signed)
Left message to call back to review ECHO results. Pt needs to setup OV with Dr. Gwenlyn Found to discuss.

## 2016-01-21 ENCOUNTER — Inpatient Hospital Stay (HOSPITAL_COMMUNITY)
Admission: EM | Admit: 2016-01-21 | Discharge: 2016-01-24 | DRG: 247 | Disposition: A | Discharge status: Home / Self Care | Payer: Medicare Other | Attending: Internal Medicine | Admitting: Internal Medicine

## 2016-01-21 ENCOUNTER — Telehealth: Payer: Self-pay | Admitting: Cardiovascular Disease

## 2016-01-21 ENCOUNTER — Emergency Department (HOSPITAL_COMMUNITY): Payer: Medicare Other

## 2016-01-21 ENCOUNTER — Encounter (HOSPITAL_COMMUNITY): Payer: Self-pay | Admitting: *Deleted

## 2016-01-21 ENCOUNTER — Telehealth: Payer: Self-pay | Admitting: Family Medicine

## 2016-01-21 DIAGNOSIS — E038 Other specified hypothyroidism: Secondary | ICD-10-CM

## 2016-01-21 DIAGNOSIS — R0602 Shortness of breath: Secondary | ICD-10-CM

## 2016-01-21 DIAGNOSIS — E039 Hypothyroidism, unspecified: Secondary | ICD-10-CM | POA: Diagnosis present

## 2016-01-21 DIAGNOSIS — I5041 Acute combined systolic (congestive) and diastolic (congestive) heart failure: Secondary | ICD-10-CM

## 2016-01-21 DIAGNOSIS — Z955 Presence of coronary angioplasty implant and graft: Secondary | ICD-10-CM

## 2016-01-21 DIAGNOSIS — E7800 Pure hypercholesterolemia, unspecified: Secondary | ICD-10-CM | POA: Diagnosis present

## 2016-01-21 DIAGNOSIS — Z8673 Personal history of transient ischemic attack (TIA), and cerebral infarction without residual deficits: Secondary | ICD-10-CM

## 2016-01-21 DIAGNOSIS — E785 Hyperlipidemia, unspecified: Secondary | ICD-10-CM | POA: Diagnosis present

## 2016-01-21 DIAGNOSIS — J90 Pleural effusion, not elsewhere classified: Secondary | ICD-10-CM | POA: Diagnosis not present

## 2016-01-21 DIAGNOSIS — I509 Heart failure, unspecified: Secondary | ICD-10-CM | POA: Insufficient documentation

## 2016-01-21 DIAGNOSIS — I5043 Acute on chronic combined systolic (congestive) and diastolic (congestive) heart failure: Secondary | ICD-10-CM | POA: Diagnosis present

## 2016-01-21 DIAGNOSIS — I200 Unstable angina: Secondary | ICD-10-CM

## 2016-01-21 DIAGNOSIS — I10 Essential (primary) hypertension: Secondary | ICD-10-CM

## 2016-01-21 DIAGNOSIS — I119 Hypertensive heart disease without heart failure: Secondary | ICD-10-CM

## 2016-01-21 DIAGNOSIS — I493 Ventricular premature depolarization: Secondary | ICD-10-CM

## 2016-01-21 DIAGNOSIS — I110 Hypertensive heart disease with heart failure: Principal | ICD-10-CM | POA: Diagnosis present

## 2016-01-21 DIAGNOSIS — N179 Acute kidney failure, unspecified: Secondary | ICD-10-CM | POA: Diagnosis not present

## 2016-01-21 DIAGNOSIS — I255 Ischemic cardiomyopathy: Secondary | ICD-10-CM | POA: Diagnosis present

## 2016-01-21 DIAGNOSIS — R002 Palpitations: Secondary | ICD-10-CM | POA: Diagnosis present

## 2016-01-21 DIAGNOSIS — I25110 Atherosclerotic heart disease of native coronary artery with unstable angina pectoris: Secondary | ICD-10-CM | POA: Diagnosis present

## 2016-01-21 DIAGNOSIS — I2510 Atherosclerotic heart disease of native coronary artery without angina pectoris: Secondary | ICD-10-CM | POA: Insufficient documentation

## 2016-01-21 DIAGNOSIS — I5022 Chronic systolic (congestive) heart failure: Secondary | ICD-10-CM | POA: Insufficient documentation

## 2016-01-21 HISTORY — DX: Ventricular premature depolarization: I49.3

## 2016-01-21 LAB — BRAIN NATRIURETIC PEPTIDE: B Natriuretic Peptide: 1049.6 pg/mL — ABNORMAL HIGH (ref 0.0–100.0)

## 2016-01-21 LAB — CBC
HEMATOCRIT: 46.1 % (ref 39.0–52.0)
HEMOGLOBIN: 15.4 g/dL (ref 13.0–17.0)
MCH: 29.2 pg (ref 26.0–34.0)
MCHC: 33.4 g/dL (ref 30.0–36.0)
MCV: 87.5 fL (ref 78.0–100.0)
Platelets: 144 10*3/uL — ABNORMAL LOW (ref 150–400)
RBC: 5.27 MIL/uL (ref 4.22–5.81)
RDW: 14.2 % (ref 11.5–15.5)
WBC: 8.4 10*3/uL (ref 4.0–10.5)

## 2016-01-21 LAB — BASIC METABOLIC PANEL
ANION GAP: 10 (ref 5–15)
BUN: 17 mg/dL (ref 6–20)
CHLORIDE: 107 mmol/L (ref 101–111)
CO2: 24 mmol/L (ref 22–32)
Calcium: 9.2 mg/dL (ref 8.9–10.3)
Creatinine, Ser: 1.26 mg/dL — ABNORMAL HIGH (ref 0.61–1.24)
GFR calc non Af Amer: 51 mL/min — ABNORMAL LOW (ref >60)
GFR, EST AFRICAN AMERICAN: 60 mL/min — AB (ref >60)
GLUCOSE: 114 mg/dL — AB (ref 65–99)
Potassium: 4.7 mmol/L (ref 3.5–5.1)
Sodium: 141 mmol/L (ref 135–145)

## 2016-01-21 LAB — TROPONIN I: Troponin I: <0.03 ng/mL (ref <0.031)

## 2016-01-21 LAB — I-STAT TROPONIN, ED: Troponin i, poc: 0.02 ng/mL (ref 0.00–0.08)

## 2016-01-21 MED ORDER — ACETAMINOPHEN 325 MG PO TABS: 650.0000 mg | ORAL_TABLET | ORAL | Status: DC | PRN

## 2016-01-21 MED ORDER — POTASSIUM CHLORIDE CRYS ER 20 MEQ PO TBCR
20.0000 meq | EXTENDED_RELEASE_TABLET | Freq: Every day | ORAL | Status: DC
  Administered 2016-01-22 – 2016-01-24 (×3): 20 meq via ORAL
  Filled 2016-01-21 (×4): qty 1

## 2016-01-21 MED ORDER — SODIUM CHLORIDE 0.9% FLUSH: 3.0000 mL | INTRAVENOUS | Status: DC | PRN

## 2016-01-21 MED ORDER — SODIUM CHLORIDE 0.9% FLUSH
3.0000 mL | Freq: Two times a day (BID) | INTRAVENOUS | Status: DC
  Administered 2016-01-22 – 2016-01-23 (×4): 3 mL via INTRAVENOUS

## 2016-01-21 MED ORDER — FUROSEMIDE 10 MG/ML IJ SOLN
20.0000 mg | Freq: Every day | INTRAMUSCULAR | Status: DC
  Administered 2016-01-22 – 2016-01-23 (×2): 20 mg via INTRAVENOUS
  Filled 2016-01-21 (×3): qty 2

## 2016-01-21 MED ORDER — ATENOLOL 25 MG PO TABS
25.0000 mg | ORAL_TABLET | Freq: Every day | ORAL | Status: DC
  Administered 2016-01-22: 25 mg via ORAL
  Filled 2016-01-21: qty 1

## 2016-01-21 MED ORDER — FUROSEMIDE 10 MG/ML IJ SOLN
40.0000 mg | INTRAMUSCULAR | Status: AC
  Administered 2016-01-21: 40 mg via INTRAVENOUS
  Filled 2016-01-21: qty 4

## 2016-01-21 MED ORDER — ONDANSETRON HCL 4 MG/2ML IJ SOLN: 4.0000 mg | Freq: Four times a day (QID) | INTRAMUSCULAR | Status: DC | PRN

## 2016-01-21 MED ORDER — SODIUM CHLORIDE 0.9 % IV SOLN: 250.0000 mL | INTRAVENOUS | Status: DC | PRN

## 2016-01-21 MED ORDER — FUROSEMIDE 10 MG/ML IJ SOLN: 20.0000 mg | Freq: Every day | INTRAMUSCULAR | Status: DC

## 2016-01-21 MED ORDER — ENOXAPARIN SODIUM 40 MG/0.4ML ~~LOC~~ SOLN: 40.0000 mg | SUBCUTANEOUS | Status: DC

## 2016-01-21 NOTE — ED Notes (Signed)
Called lab to add on BNP to labs

## 2016-01-21 NOTE — ED Provider Notes (Signed)
CSN: [id number]     Arrival date & time 01/21/16  [id number] History   First MD Initiated Contact with Patient 01/21/16 1133     Chief Complaint  Patient presents with  . Shortness of Breath  . Palpitations     HPI   Patrick Maxwell is a 82 y.o. male with a PMH of HTN, HLD, palpitations who presents to the ED with shortness of breath. He states this has been present for the past month and has progressively worsened. He denies exacerbating factors. He has not tried anything for symptom relief. He states he feels more comfortable sitting up than lying down flat, though denies orthopnea. He denies fever, chills, cough, congestion, chest pain, abdominal pain, N/V, lower extremity edema. He notes he did experienced left sided arm pain this morning prior to arrival, though states this is now resolved. He reports he was seen by his PCP for this in April and referred to cardiology. He states he saw Dr. Gwenlyn Found, and recently had an echo and carotid US.   Past Medical History  Diagnosis Date  . Irregular heart beat     never specifically diagnosed.  Palpitations since early 2s  . Stroke ([place])   . Hypertension   . Hyperlipidemia    Past Surgical History  Procedure Laterality Date  . Hernia repair  06/1992   Family History  Problem Relation Age of Onset  . Stroke Mother   . Early death Mother   . Cancer Brother    Social History  Substance Use Topics  . Smoking status: Never Smoker   . Smokeless tobacco: Never Used  . Alcohol Use: No      Review of Systems  Constitutional: Negative for fever and chills.  HENT: Negative for congestion.   Respiratory: Positive for shortness of breath. Negative for cough.   Cardiovascular: Negative for chest pain and leg swelling.  Gastrointestinal: Negative for nausea, vomiting and abdominal pain.  All other systems reviewed and are negative.     Allergies  Review of patient's allergies indicates no known allergies.  Home Medications   Prior to  Admission medications   Medication Sig Start Date End Date Taking? Authorizing Provider  Ascorbic Acid (VITAMIN C PO) Take by mouth.    Historical Provider, MD  atenolol (TENORMIN) 25 MG tablet Take 1 tablet (25 mg total) by mouth daily. 06/28/15   Lonie Peak Dixon, PA-C  Coenzyme Q10 (CO Q 10 PO) Take by mouth.    Historical Provider, MD  furosemide (LASIX) 20 MG tablet Take 1 tablet (20 mg total) by mouth daily. X 3 day only 12/21/15   Orlena Sheldon, PA-C  Magnesium 100 MG TABS Take 1 tablet by mouth 2 (two) times daily.    Historical Provider, MD  potassium chloride SA (K-DUR,KLOR-CON) 20 MEQ tablet Take 1 tablet (20 mEq total) by mouth daily. X 3 days only along with furosemide 12/21/15   Mary B Dixon, PA-C    BP 149/81 mmHg  Pulse   Temp(Src) 98 F (36.7 C) (Oral)  Resp 19  Wt 96.219 kg  SpO2 98% Physical Exam  Constitutional: He is oriented to person, place, and time. He appears well-developed and well-nourished. No distress.  HENT:  Head: Normocephalic and atraumatic.  Right Ear: External ear normal.  Left Ear: External ear normal.  Nose: Nose normal.  Mouth/Throat: Uvula is midline, oropharynx is clear and moist and mucous membranes are normal.  Eyes: Conjunctivae, EOM and lids are normal. Pupils are equal,  round, and reactive to light. Right eye exhibits no discharge. Left eye exhibits no discharge. No scleral icterus.  Neck: Normal range of motion. Neck supple.  Cardiovascular: Normal rate, regular rhythm, normal heart sounds, intact distal pulses and normal pulses.   Frequent PVCs.  Pulmonary/Chest: Effort normal. No respiratory distress. He has no wheezes. He has no rales.  Decreased breath sounds at lung bases.  Abdominal: Soft. Normal appearance and bowel sounds are normal. He exhibits no distension and no mass. There is no tenderness. There is no rigidity, no rebound and no guarding.  Musculoskeletal: Normal range of motion. He exhibits no edema or tenderness.  No lower  extremity edema.  Neurological: He is alert and oriented to person, place, and time. He has normal strength. No cranial nerve deficit or sensory deficit.  Skin: Skin is warm, dry and intact. No rash noted. He is not diaphoretic. No erythema. No pallor.  Psychiatric: He has a normal mood and affect. His speech is normal and behavior is normal.  Nursing note and vitals reviewed.   ED Course  Procedures (including critical care time)  Labs Review Labs Reviewed  BASIC METABOLIC PANEL - Abnormal; Notable for the following:    Glucose, Bld 114 (*)    Creatinine, Ser 1.26 (*)    GFR calc non Af Amer 51 (*)    GFR calc Af Amer 60 (*)    All other components within normal limits  CBC - Abnormal; Notable for the following:    Platelets 144 (*)    All other components within normal limits  BRAIN NATRIURETIC PEPTIDE - Abnormal; Notable for the following:    B Natriuretic Peptide 1049.6 (*)    All other components within normal limits  I-STAT TROPOININ, ED    Imaging Review Dg Chest 2 View  01/21/2016  CLINICAL DATA:  Increasing shortness of breath. EXAM: CHEST  2 VIEW COMPARISON:  12/21/2015 and 10/06/2009 FINDINGS: There has been slight increase in the cardiac silhouette size since the prior study. New slight distention of the azygos vein. New small Kerley B-lines at the right lung base with a small posteriorly loculated right pleural effusion. No infiltrates. No acute osseous abnormality. Aortic atherosclerosis. IMPRESSION: Increasing heart size since the prior study with new Kerley B-lines and a small right pleural effusion consistent with congestive heart failure. Aortic atherosclerosis. Electronically Signed   By: Lorriane Shire M.D.   On: 01/21/2016 10:35   I have personally reviewed and evaluated these images and lab results as part of my medical decision-making.   EKG Interpretation   Date/Time:  Monday Jan 21 2016 09:56:23 EDT Ventricular Rate:  95 PR Interval:  178 QRS Duration:  92 QT Interval:  350 QTC Calculation: 439 R Axis:   84 Text Interpretation:  Sinus rhythm with frequent and consecutive Premature  ventricular complexes ST \\T \ T wave abnormality, consider inferolateral  ischemia Abnormal ECG Inferior ST abnormality is new in comparison to  prior Confirmed by Sanford Mayville MD, [place] ([id number]) on 01/21/2016 12:04:03 PM      MDM   Final diagnoses:  Shortness of breath    82 year old male presents with shortness of breath x 1 month. Also notes left arm pain today prior to arrival, now resolved. Denies fever, chills, cough, congestion, chest pain, abdominal pain, N/V, lower extremity edema.  Patient is afebrile. Vital signs stable (HR 70s). Heart regular rate and rhythm with frequent PVCs. Decreased breath sounds in lung bases bilaterally. Abdomen soft, non-tender, non-distended. No lower  extremity edema.  Echo in April remarkable for EF 99991111, grade 2 diastolic dysfunction. EKG sinus rhythm, HR 95, frequent PVCs, inferior ST abnormality. Troponin negative. CXR with increasing heart size, new kerley b-lines, small right pleural effusion. BNP 1049.6. CBC negative for leukocytosis or anemia. BMP remarkable for creatinine 1.26.  Spoke with Tye Savoy, NP ([facility]). Patient to be admitted for further evaluation and management of CHF. Patient discussed with and seen by Dr. Billy Fischer.  BP 149/81 mmHg  Pulse   Temp(Src) 98 F (36.7 C) (Oral)  Resp 19  Wt 96.219 kg  SpO2 98%     Marella Chimes, PA-C 01/21/16 [place], MD 01/22/16 [phone]

## 2016-01-21 NOTE — Telephone Encounter (Signed)
Pt's wife called wanting to know if she should take him to the ER or bring here. I asked what was wrong with the pt and she states that he is having pain in his left arm and just had Echo but has not heard about results. I recommended that she take him to ER for evaluation. Then she states that he is not hurting in his chest just his left arm and that this is not a new problem but has been going on for a few days. I recommended that she bring him in to see MBD and tried to make and appt for 12 and she states that he needs to be seen sooner then that. Before I could make an appt she then jumped to wanting to take him to see Dr. Gwenlyn Found instead of [place]. I instructed her to call them and if they could not get him in today to call us back and we would schedule him and appt with MBD.

## 2016-01-21 NOTE — H&P (Signed)
History and Physical    Patrick Maxwell [id number] DOB: 21-Oct-1930 DOA: 01/21/2016  Referring MD/NP/PA: EDP - Bernerd Limbo, P.A.  PCP: Karis Juba, PA-C  Outpatient Specialists: Quay Burow, MD - Cardiology Patient coming from: Home  Chief Complaint: SOB and left arm pain.   HPI: Patrick Maxwell is a 82 y.o. male with medical history significant for HTN, hyperlipidemia and remote CVA. Patient in relatively good health, physically active at home. Over last few weeks patient has been having intermittent shortness of breath unrelated to exertion. Shortness of breath last about 30 minutes, spontaneously resolved. Patient does not feel like he is getting adequate air intake with deep breaths. No associated chest pain. Patient denies cough but wife says that he has had some coughing over the last week or so. Patient saw PCP late April for evaluation of SOB. Patient given 3 days of Lasix 20mg  daily but only took 2 days worth . Patient does not recall any significant improvement in shortness of breath after Lasix Chest x-ray at that time raised suspicion of a mild right base infiltrate. BNP was 606. Patient gave a history of an irregular heartbeat present since his twenties. PCP referred patient to [facility] and he was seen by Dr. Gwenlyn Found 12/25/15. Echocardiogram done 01/07/16 revealed ejection fraction of 99991111, grade 2 diastolic dysfunction. Patient was to follow up with cardiology for echo reports but in the interim developed worsening dyspnea. Over the last 2-3 days patient has become orthopneic with wheezing. He has been sleeping in a recliner. He reported left upper extremity discomfort today. Patient states he actually gets discomfort in his left upper extremity when physically exerting himself. The discomfort is mainly from the shoulder to the elbow and resolves with rest . Again no associated chest pain. Patient took two 81mg  ASA today   ED Course:  CXR- increasing heart size and a  small right pleural effusion.Sinus rhythm with frequent and consecutive    EKG - PVCs ST & T wave abnormality, consider inferolateral ischemia inferior ST abnormality is new in comparison to prior  Pertinent labs: Trop 0.02, NP 1049  Review of Systems: As per HPI otherwise 10 point review of systems negative.   Past Medical History  Diagnosis Date  . Irregular heart beat     never specifically diagnosed.  Palpitations since early 78s  . Stroke ([place])   . Hypertension   . Hyperlipidemia     Past Surgical History  Procedure Laterality Date  . Hernia repair  06/1992     reports that he has never smoked. He has never used smokeless tobacco. He reports that he does not drink alcohol or use illicit drugs.  No Known Allergies  Family History  Problem Relation Age of Onset  . Stroke Mother   . Early death Mother   . Cancer Brother     Prior to Admission medications   Medication Sig Start Date End Date Taking? Authorizing Provider  Ascorbic Acid (VITAMIN C PO) Take by mouth.    Historical Provider, MD  atenolol (TENORMIN) 25 MG tablet Take 1 tablet (25 mg total) by mouth daily. 06/28/15   Lonie Peak Dixon, PA-C  Coenzyme Q10 (CO Q 10 PO) Take by mouth.    Historical Provider, MD  furosemide (LASIX) 20 MG tablet Take 1 tablet (20 mg total) by mouth daily. X 3 day only 12/21/15   Orlena Sheldon, PA-C  Magnesium 100 MG TABS Take 1 tablet by mouth 2 (two) times daily.  Historical Provider, MD  potassium chloride SA (K-DUR,KLOR-CON) 20 MEQ tablet Take 1 tablet (20 mEq total) by mouth daily. X 3 days only along with furosemide 12/21/15   Orlena Sheldon, PA-C    Physical Exam: Filed Vitals:   01/21/16 1231  BP: 149/97  Temp: 98 F (36.7 C)  TempSrc: Oral  Resp: 19   Constitutional: Pleasant, well-developed white male in NAD, calm, comfortable Filed Vitals:   01/21/16 1231  BP: 149/97  Temp: 98 F (36.7 C)  TempSrc: Oral  Resp: 19   Eyes: PERRL, lids and conjunctivae  normal ENMT: Mucous membranes are moist. Posterior pharynx clear of any exudate or lesions.Normal dentition.  Neck: normal, supple, no masses, no thyromegaly Respiratory: Bibasilar inspiratory crackles, left greater than right. No wheezing. Normal respiratory effort. No accessory muscle use.  Cardiovascular: Regular rate, irregular rhythm. No rubs / gallops. No extremity edema. 2+ pedal pulses. No carotid bruits.  Abdomen: no tenderness, no masses palpated. No hepatomegaly. Bowel sounds positive.  Musculoskeletal: no clubbing / cyanosis. No joint deformity upper and lower extremities. Good ROM, no contractures. Normal muscle tone.  Skin: no rashes, lesions, ulcers. No induration Neurologic: CN 2-12 grossly intact. Sensation intact, DTR normal. Strength 5/5 in all 4.  Psychiatric: Normal judgment and insight. Alert and oriented x 3. Normal mood.   Labs on Admission: I have personally reviewed following labs and imaging studies  CBC:  Recent Labs Lab 01/21/16 1007  WBC 8.4  HGB 15.4  HCT 46.1  MCV 87.5  PLT 123456*   Basic Metabolic Panel:  Recent Labs Lab 01/21/16 1007  NA 141  K 4.7  CL 107  CO2 24  GLUCOSE 114*  BUN 17  CREATININE 1.26*  CALCIUM 9.2    Radiological Exams on Admission: Dg Chest 2 View  01/21/2016  CLINICAL DATA:  Increasing shortness of breath. EXAM: CHEST  2 VIEW COMPARISON:  12/21/2015 and 10/06/2009 FINDINGS: There has been slight increase in the cardiac silhouette size since the prior study. New slight distention of the azygos vein. New small Kerley B-lines at the right lung base with a small posteriorly loculated right pleural effusion. No infiltrates. No acute osseous abnormality. Aortic atherosclerosis. IMPRESSION: Increasing heart size since the prior study with new Kerley B-lines and a small right pleural effusion consistent with congestive heart failure. Aortic atherosclerosis. Electronically Signed   By: Lorriane Shire M.D.   On: 01/21/2016 10:35     EKG: Independently reviewed.   EKG Interpretation  Date/Time:  Monday Jan 21 2016 09:56:23 EDT Ventricular Rate:  95 PR Interval:  178 QRS Duration: 92 QT Interval:  350 QTC Calculation: 439 R Axis:   84 Text Interpretation:  Sinus rhythm with frequent and consecutive Premature ventricular complexes ST \\T \ T wave abnormality, consider inferolateral ischemia Abnormal ECG Inferior ST abnormality is new in comparison to prior Confirmed by [facility] MD, [place] ([id number]) on 01/21/2016 12:04:03 PM       Assessment/Plan Active Problems:   History of CVA (cerebrovascular accident) without residual deficits   Hypertension, benign   Subclinical hypothyroidism   AKI (acute kidney injury) ([place])    Combined systolic and diastolic heart failure. BNP 1049, CXR with enlarging heart and small right pleural effusion.  ECHO on 4/17 reveals grade 2 diastolic dysfunction and EF of 30-35%. Etiology of heart failure not yet clear, ? secondary to arrhthymias, MI, other ? Today's EKG suggest ST changes but after review this doesn't seem clear cut -Admit to OBS -Telemetry -Heart Failure order  set utilized -Cycle troponins -Repeat EKG now.  - Daily weights and strict I/O - Given 40mg  of IV lasix in ED. Begin 20mg  IV lasix tomorrow (lasix naive) - ECHO - just done 01/07/16  - Eventual initiation of ACEI  - Continue home beta blocker -  Lasix 20mg  IV daily (basically Lasix naive at home ( just 3 doses since early April)  AKI. Baseline Cr 1.1, up to 1.26 today. He needs diuresis which could transiently increase Cr even further -am BMET -Avoid nephrotoxic medications.   Hypertension, controlled -continue Atenolol.   Subclinical hypothyroidism. TSH done 12/19/15. Minimally elevated TSH with normal free T4. PCP following.   History of hypercholesterolemia, not being treated. Last lipid panel in Epic was 2012.  -am fasting lipids.   Hx of CVA (per patient report). No residual deficits noted   DVT  prophylaxis: Lovenox Code Status: Full code Family Communication: Spoke with wife in room. She understands plan to keep patient at least overnight.  Disposition Plan: Home in 24-48 hours Consults called: None Admission status: Observation - Telemetry   Tye Savoy NP [facility] Pager [phone]  If 7PM-7AM, please contact night-coverage [email or web address] Password [email or web address]  01/21/2016, 1:25 PM

## 2016-01-21 NOTE — ED Notes (Signed)
Here with sob for the last month that is increasing.  Seen MD and had some testing by Dr. Gwenlyn Found and has had ultrasound of carotids. Pt has been having irregular heart beat and increasing sob

## 2016-01-21 NOTE — ED Notes (Signed)
EDPA in to see pt

## 2016-01-21 NOTE — Telephone Encounter (Signed)
Pt of Dr. Townsend Roger incoming call from pt's wife for SOB which started yesterday evening, also noted arm pain. Wife reports pt has been awake since 4am w left arm pain, for which he took 2 ASA 81mg . He reports relief now w/ pain. Still SOB. They were advised by fam med to go to ER for evaluation. I affirmed this recommendation. Elberta Fortis had offered them same-week appt, but I explained that his symptoms if warranted urgent evaluation would be better examined in emergency room. Advised [facility]. Pt being brought by wife in personal vehicle. Have informed Trish, inpatient coordinator.

## 2016-01-21 NOTE — Telephone Encounter (Signed)
Pt c/o Shortness Of Breath: STAT if SOB developed within the last 24 hours or pt is noticeably SOB on the phone  1. Are you currently SOB (can you hear that pt is SOB on the phone)? no  2. How long have you been experiencing SOB? Yesterday 4/30  3. Are you SOB when sitting or when up moving around? Both-  4. Are you currently experiencing any other symptoms? Pain in left arm

## 2016-01-21 NOTE — Telephone Encounter (Signed)
Error did not need to start note

## 2016-01-22 ENCOUNTER — Telehealth: Payer: Self-pay | Admitting: *Deleted

## 2016-01-22 ENCOUNTER — Encounter (HOSPITAL_COMMUNITY): Payer: Self-pay | Admitting: Physician Assistant

## 2016-01-22 DIAGNOSIS — I255 Ischemic cardiomyopathy: Secondary | ICD-10-CM | POA: Diagnosis present

## 2016-01-22 DIAGNOSIS — N179 Acute kidney failure, unspecified: Secondary | ICD-10-CM

## 2016-01-22 DIAGNOSIS — I208 Other forms of angina pectoris: Secondary | ICD-10-CM | POA: Diagnosis not present

## 2016-01-22 DIAGNOSIS — I2510 Atherosclerotic heart disease of native coronary artery without angina pectoris: Secondary | ICD-10-CM | POA: Diagnosis not present

## 2016-01-22 DIAGNOSIS — R0602 Shortness of breath: Secondary | ICD-10-CM | POA: Diagnosis not present

## 2016-01-22 DIAGNOSIS — Z8673 Personal history of transient ischemic attack (TIA), and cerebral infarction without residual deficits: Secondary | ICD-10-CM | POA: Diagnosis not present

## 2016-01-22 DIAGNOSIS — I25110 Atherosclerotic heart disease of native coronary artery with unstable angina pectoris: Secondary | ICD-10-CM | POA: Diagnosis not present

## 2016-01-22 DIAGNOSIS — I5041 Acute combined systolic (congestive) and diastolic (congestive) heart failure: Secondary | ICD-10-CM | POA: Diagnosis not present

## 2016-01-22 DIAGNOSIS — I110 Hypertensive heart disease with heart failure: Secondary | ICD-10-CM | POA: Diagnosis present

## 2016-01-22 DIAGNOSIS — E785 Hyperlipidemia, unspecified: Secondary | ICD-10-CM | POA: Diagnosis present

## 2016-01-22 DIAGNOSIS — E039 Hypothyroidism, unspecified: Secondary | ICD-10-CM | POA: Diagnosis present

## 2016-01-22 DIAGNOSIS — I5043 Acute on chronic combined systolic (congestive) and diastolic (congestive) heart failure: Secondary | ICD-10-CM | POA: Diagnosis present

## 2016-01-22 DIAGNOSIS — I493 Ventricular premature depolarization: Secondary | ICD-10-CM | POA: Diagnosis present

## 2016-01-22 DIAGNOSIS — I200 Unstable angina: Secondary | ICD-10-CM | POA: Diagnosis not present

## 2016-01-22 LAB — BASIC METABOLIC PANEL
Anion gap: 11 (ref 5–15)
BUN: 20 mg/dL (ref 6–20)
CHLORIDE: 104 mmol/L (ref 101–111)
CO2: 25 mmol/L (ref 22–32)
CREATININE: 1.15 mg/dL (ref 0.61–1.24)
Calcium: 9 mg/dL (ref 8.9–10.3)
GFR, EST NON AFRICAN AMERICAN: 57 mL/min — AB (ref >60)
Glucose, Bld: 104 mg/dL — ABNORMAL HIGH (ref 65–99)
POTASSIUM: 4.2 mmol/L (ref 3.5–5.1)
SODIUM: 140 mmol/L (ref 135–145)

## 2016-01-22 LAB — LIPID PANEL
CHOL/HDL RATIO: 4.7 ratio
CHOLESTEROL: 166 mg/dL (ref 0–200)
HDL: 35 mg/dL — ABNORMAL LOW (ref >40)
LDL Cholesterol: 116 mg/dL — ABNORMAL HIGH (ref 0–99)
TRIGLYCERIDES: 74 mg/dL (ref <150)
VLDL: 15 mg/dL (ref 0–40)

## 2016-01-22 LAB — TROPONIN I

## 2016-01-22 MED ORDER — SODIUM CHLORIDE 0.9 % IV SOLN
INTRAVENOUS | Status: DC
  Administered 2016-01-23: 06:00:00 via INTRAVENOUS

## 2016-01-22 MED ORDER — SODIUM CHLORIDE 0.9% FLUSH
3.0000 mL | Freq: Two times a day (BID) | INTRAVENOUS | Status: DC
  Administered 2016-01-22: 3 mL via INTRAVENOUS

## 2016-01-22 MED ORDER — CARVEDILOL 3.125 MG PO TABS
3.1250 mg | ORAL_TABLET | Freq: Two times a day (BID) | ORAL | Status: DC
  Administered 2016-01-22 – 2016-01-23 (×2): 3.125 mg via ORAL
  Filled 2016-01-22 (×2): qty 1

## 2016-01-22 MED ORDER — SODIUM CHLORIDE 0.9% FLUSH: 3.0000 mL | INTRAVENOUS | Status: DC | PRN

## 2016-01-22 MED ORDER — SODIUM CHLORIDE 0.9 % IV SOLN: 250.0000 mL | INTRAVENOUS | Status: DC | PRN

## 2016-01-22 MED ORDER — ASPIRIN 81 MG PO CHEW
324.0000 mg | CHEWABLE_TABLET | ORAL | Status: AC
  Administered 2016-01-23: 324 mg via ORAL
  Filled 2016-01-22: qty 4

## 2016-01-22 NOTE — Consult Note (Signed)
CARDIOLOGY CONSULT NOTE   Patient ID: Patrick Maxwell MRN: [id number] DOB/AGE: 24-Jun-1931 82 y.o.  Admit date: 01/21/2016  Primary Physician   Patrick Juba, PA-C Primary Cardiologist   Dr Patrick Maxwell Reason for Consultation   L arm pain, possible anginal equivalent  [id number] [name] is a 82 y.o. year old male with a history of CVA, HTN, HLD, PVCs.   [name] had a physically stressful and that in December with the move, and then had an upper respiratory illness in February. Since then, he noticed increased dyspnea on exertion. He was still maintaining a good activity level, working in the yard and walking for exercise up to a mile and a half, but he still felt his dyspnea on exertion was increased from baseline.  Starting March 2017, the dyspnea on exertion had progressed to the point that he was short of breath after taking a shower or other minor activities. He would also feel like he had trouble getting a deep breath. He had a mild cough that was nonproductive, no fevers or chills. He never had chest pain. Around this time, he also began noticing left upper arm pain. He would get it after he started walking, but it would resolve if he kept walking. He never took anything for the pain. It was mild, about a 3/10. There was no numbness or tingling.  Seen by primary care and scheduled for cardiology evaluation. He was given short-term Lasix, taking Lasix 20 mg 2 doses.  Evaluated by Dr Patrick Maxwell 12/25/2015, to establish care in [place]. He did not tell Dr. Gwenlyn Maxwell about the dyspnea on exertion He had an echo with EF 30-35%, results below. Office visit to discuss results scheduled 05/04.  On the day of admission, he woke with left arm discomfort about 4 AM. He was also short of breath, which got better when he sat up in a chair. After his wife got up, they called the office and came to the hospital as scheduled. By the time he arrived to the emergency room, the arm pain had  resolved. He was in heart failure. Since he has been hospitalized, he has been diuresed with IV Lasix and lost about 7 pounds. He feels he is breathing much better. He has not had recurrence of the arm pain.   He is aware that his heart skips and was told he has PVCs years ago. He never gets lightheaded or short of breath from them.   Past Medical History  Diagnosis Date  . Frequent unifocal PVCs     Palpitations since early 20s, PVCs and pairs  . Stroke [facility]) 2009  . Hypertension   . Hyperlipidemia      Past Surgical History  Procedure Laterality Date  . Hernia repair  06/1992    No Known Allergies  I have reviewed the patient's current medications . atenolol  25 mg Oral Daily  . enoxaparin (LOVENOX) injection  40 mg Subcutaneous Q24H  . furosemide  20 mg Intravenous Daily  . potassium chloride  20 mEq Oral Daily  . sodium chloride flush  3 mL Intravenous Q12H     sodium chloride, acetaminophen, ondansetron (ZOFRAN) IV, sodium chloride flush  Prior to Admission medications   Medication Sig Start Date End Date Taking? Authorizing Provider  Ascorbic Acid (VITAMIN C PO) Take by mouth.   Yes Historical Provider, MD  atenolol (TENORMIN) 25 MG tablet Take 1 tablet (25 mg total) by mouth daily. 06/28/15  Yes Patrick Sheldon,  PA-C  Coenzyme Q10 (CO Q 10 PO) Take 1 capsule by mouth daily.    Yes Historical Provider, MD  Magnesium 100 MG TABS Take 1 tablet by mouth 2 (two) times daily.   Yes Historical Provider, MD  OVER THE COUNTER MEDICATION Take 1 tablet by mouth daily. "Prostate Formula" supplement   Yes Historical Provider, MD  Potassium 99 MG TABS Take 99 mg by mouth every other day.   Yes Historical Provider, MD  vitamin E 400 UNIT capsule Take 400 Units by mouth 3 (three) times a week.   Yes Historical Provider, MD  furosemide (LASIX) 20 MG tablet Take 1 tablet (20 mg total) by mouth daily. X 3 day only Patient not taking: Reported on 01/21/2016 12/21/15   Patrick Sheldon, PA-C    potassium chloride SA (K-DUR,KLOR-CON) 20 MEQ tablet Take 1 tablet (20 mEq total) by mouth daily. X 3 days only along with furosemide Patient not taking: Reported on 01/21/2016 12/21/15   Patrick Sheldon, PA-C     Social History   Social History  . Marital Status: Married    Spouse Name: N/A  . Number of Children: N/A  . Years of Education: N/A   Occupational History  . Not on file.   Social History Main Topics  . Smoking status: Never Smoker   . Smokeless tobacco: Never Used  . Alcohol Use: No  . Drug Use: No  . Sexual Activity: Not Currently   Other Topics Concern  . Not on file   Social History Narrative    Family Status  Relation Status Death Age  . Mother Deceased 71  . Brother Deceased 50  . Father Deceased 29  . Maternal Grandmother Deceased   . Maternal Grandfather Deceased   . Paternal Grandmother Deceased   . Paternal Grandfather Deceased   . Brother Deceased 54  . Brother Deceased 80   Family History  Problem Relation Age of Onset  . Stroke Mother   . Early death Mother   . Cancer Brother      ROS:  Full 14 point review of systems complete and Maxwell to be negative unless listed above.  Physical Exam: Blood pressure 123/59, pulse 76, temperature 98.3 F (36.8 C), temperature source Oral, resp. rate 18, height 5\' 9"  (1.753 m), weight 205 lb 8 oz (93.214 kg), SpO2 92 %.  General: Well developed, well nourished, male in no acute distress Head: Eyes PERRLA, No xanthomas.   Normocephalic and atraumatic, oropharynx without edema or exudate. Dentition: good Lungs: bilateral rales Heart: Heart regularly irregular with S1, S2, no murmur. pulses are 2+ all 4 extrem.   Neck: No carotid bruits. No lymphadenopathy.  JVD Not elevated. Abdomen: Bowel sounds present, abdomen soft and non-tender without masses or hernias noted. Msk:  No spine or cva tenderness. No weakness, no joint deformities or effusions. Extremities: No clubbing or cyanosis. No edema.  Neuro: Alert  and oriented X 3. No focal deficits noted. Psych:  Good affect, responds appropriately Skin: No rashes or lesions noted.  Labs:   Lab Results  Component Value Date   WBC 8.4 01/21/2016   HGB 15.4 01/21/2016   HCT 46.1 01/21/2016   MCV 87.5 01/21/2016   PLT 144* 01/21/2016     Recent Labs Lab 01/22/16 0540  NA 140  K 4.2  CL 104  CO2 25  BUN 20  CREATININE 1.15  CALCIUM 9.0  GLUCOSE 104*    Recent Labs  01/21/16 1615 01/21/16 2105 01/22/16  0540  TROPONINI <0.03 <0.03 <0.03    Recent Labs  01/21/16 1121  TROPIPOC 0.02   B NATRIURETIC PEPTIDE  Date/Time Value Ref Range Status  01/21/2016 10:07 AM 1049.6* 0.0 - 100.0 pg/mL Final   Lab Results  Component Value Date   CHOL 166 01/22/2016   HDL 35* 01/22/2016   LDLCALC 116* 01/22/2016   TRIG 74 01/22/2016   TSH  Date/Time Value Ref Range Status  12/19/2015 03:28 PM 4.57* 0.40 - 4.50 mIU/L Final    Echo: 01/07/2016 - Left ventricle: The cavity size was normal. Wall thickness was  increased in a pattern of mild LVH. Systolic function was  moderately to severely reduced. The estimated ejection fraction  was in the range of 30% to 35%. Diffuse hypokinesis. Features are  consistent with a pseudonormal left ventricular filling pattern,  with concomitant abnormal relaxation and increased filling  pressure (grade 2 diastolic dysfunction). - Aortic valve: There was no stenosis. There was mild  regurgitation. - Mitral valve: Mildly calcified annulus. There was mild  regurgitation. - Left atrium: The atrium was mildly dilated. - Right ventricle: Poorly visualized. The cavity size was mildly  dilated. Systolic function was mildly reduced. - Tricuspid valve: Peak RV-RA gradient (S): 20 mm Hg. - Pulmonary arteries: PA peak pressure: 23 mm Hg (S). - Inferior vena cava: The vessel was normal in size. The  respirophasic diameter changes were in the normal range (= 50%),  consistent with normal central  venous pressure. Impressions: - Technically difficult study with poor acoustic windows. Echo  contrast likely would have helped but was not used. Normal LV  size with mild LV hypertrophy. EF 30-35%, diffuse hypokinesis.  Mildly dilated RV with mildly decreased systolic function. Mild  mitral regurgitation.  ECG:  01/21/2016 Sinus rhythm, frequent PVCs and pairs Inferior ST segment changes, no significant difference from 12/25/2015  Radiology:  Dg Chest 2 View 01/21/2016  CLINICAL DATA:  Increasing shortness of breath. EXAM: CHEST  2 VIEW COMPARISON:  12/21/2015 and 10/06/2009 FINDINGS: There has been slight increase in the cardiac silhouette size since the prior study. New slight distention of the azygos vein. New small Kerley B-lines at the right lung base with a small posteriorly loculated right pleural effusion. No infiltrates. No acute osseous abnormality. Aortic atherosclerosis. IMPRESSION: Increasing heart size since the prior study with new Kerley B-lines and a small right pleural effusion consistent with congestive heart failure. Aortic atherosclerosis. Electronically Signed   By: Lorriane Shire M.D.   On: 01/21/2016 10:35    ASSESSMENT AND PLAN:   The patient was seen today by Dr Burt Knack, the patient evaluated and the data reviewed.   1.   Acute combined systolic and diastolic congestive heart failure (HCC) - New dx, unclear if LVD is ischemic or nonischemic - volume status much improved - feel he is appropriate for cath from a respiratory standpoint - needs cath to evaluate for ischemic heart disease  2.  AKI (acute kidney injury) ([place]).  - peak BUN 17, peak Cr 1.26 with diuresis - BUN/Cr now essentially the same as on admission - no hx renal problems  3.  Hypertension, benign - Pt has been reluctant to take additional BP meds in the past, has been on atenolol  Otherwise, per IM.  Active Problems:   History of CVA (cerebrovascular accident) without residual deficits    Subclinical hypothyroidism   CHF (congestive heart failure) ([place])    SignedRosaria Ferries, PA-C 01/22/2016 1:25 PM Beeper [id number]  Co-Sign  MD  Patient seen, examined. Available data reviewed. Agree with findings, assessment, and plan as outlined by Rosaria Ferries, PA-C. The patient is independently interviewed and examined. Lab and echo data is reviewed. Exam reveals an alert, oriented male in no distress. Lungs are clear. Heart is regular rate and rhythm. Abdomen is soft and nontender. Jugular venous pressure is mildly elevated. There is no pretibial edema present. EKG shows sinus rhythm with frequent PVCs. The patient has acute systolic heart failure with severe LV dysfunction documented by echocardiography. I had a lengthy discussion with the patient and his family today. They understand the differential diagnosis includes ischemic versus nonischemic cardiomyopathies. Right and left heart catheterization are indicated. I have reviewed the risks, indications, and alternatives to cardiac catheterization, possible angioplasty, and stenting with the patient. Risks include but are not limited to bleeding, infection, vascular injury, stroke, myocardial infection, arrhythmia, kidney injury, radiation-related injury in the case of prolonged fluoroscopy use, emergency cardiac surgery, and death. The patient understands the risks of serious complication is 1-2 in [id number] with diagnostic cardiac cath and 1-2% or less with angioplasty/stenting.   If he has no CAD, it is possible the frequent PVC's are contributing to his cardiomyopathy. In addition, he noted clinical worsening of his dyspnea after a viral infection last year. Will re-evaluate after his cath is completed.   Regarding medical therapy, will stop atenolol and start carvedilol 3.125 mg BID. Start ACE after cardiac catheterization. Titrate meds as tolerated.   Sherren Mocha, M.D. 01/22/2016 4:23 PM

## 2016-01-22 NOTE — Telephone Encounter (Signed)
-----   Message from Lorretta Harp, MD sent at 01/13/2016  3:19 PM EDT ----- Return office visit with me to discuss results at next available

## 2016-01-22 NOTE — Research (Signed)
REDS_0  Informed Consent   Subject Name: Patrick Maxwell  Subject met inclusion and exclusion criteria.  The informed consent form, study requirements and expectations were reviewed with the subject and questions and concerns were addressed prior to the signing of the consent form.  The subject verbalized understanding of the trail requirements.  The subject agreed to participate in the REDS_1  trial and signed the informed consent.  The informed consent was obtained prior to performance of any protocol-specific procedures for the subject.  A copy of the signed informed consent was given to the subject and a copy was placed in the subject's medical record.  [name] 01/22/2016, 17:11

## 2016-01-22 NOTE — Telephone Encounter (Signed)
Called pt and gave results per Dr Kennon Holter note of echo to pt and wife. Pt was admitted to hospital 01/21/16 and is currently admitted. Not sure how long he will be admitted. Wife requested to have his upcoming appt canceled and she will call to reschedule.

## 2016-01-22 NOTE — H&P (Signed)
PROGRESS NOTE    Patrick Maxwell  [id number] DOB: 1931/04/29 DOA: 01/21/2016 PCP: Karis Juba, PA-C    Outpatient Specialists:    Brief Narrative: 82 y.o. male with medical history significant for HTN, hyperlipidemia, remote CVA and history of an irregular heartbeat since his twenties. PCP referred patient to [facility] and he was seen by Dr. Gwenlyn Found 12/25/15. Echocardiogram done 01/07/16 revealed ejection fraction of 99991111, grade 2 diastolic dysfunction. Patient was to follow up with cardiology for echo reports but in the interim developed worsening dyspnea. Over the last 2-3 days patient has become orthopneic with wheezing. He has been sleeping in a recliner. He reported left upper extremity discomfort today. Patient states he actually gets discomfort in his left upper extremity when physically exerting himself. The discomfort is mainly from the shoulder to the elbow and resolves with rest (worrisome for Angina equivalent). Patient took two 81mg  ASA today    Assessment/Plan Active Problems:  History of CVA (cerebrovascular accident) without residual deficits  Hypertension, benign  Subclinical hypothyroidism  AKI (acute kidney injury) ([place])    Combined systolic and diastolic heart failure. Symptoms improved significantly with diuresis. Etiology of CHF is undetermined, but worrisome for possible underlying ischemia. Cardiology consult. Patient is eager to be discharged back home. I have discussed need for further cardiac evaluation with patient, patient' wife, patient's son and daughter.  AKI. 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 Continue diuretics.   Hypertension - Optimize.  Hypercholesterolemia.   Hx of CVA (per patient report). No residual deficits noted   DVT prophylaxis: Lovenox Code Status: Full code Family Communication: Spoke with wife in room. She understands plan to keep patient at least overnight.  Disposition Plan:  Home in 24-48 hours Consults called: None Admission status: Observation - Telemetry  Consultants:   Cardiology   Subjective: SOB has resolved significantly. Tells me that the left arm pain is also improving. No fever or chills.  Objective: Filed Vitals:   01/22/16 0300 01/22/16 0613 01/22/16 0937 01/22/16 1338  BP:  144/63 123/59 140/55  Pulse: 76 77 76 77  Temp:  97.8 F (36.6 C) 98.3 F (36.8 C)   TempSrc:  Oral Oral   Resp:  18 18 18   Height:      Weight:  93.214 kg (205 lb 8 oz)    SpO2:  95% 92% 97%    Intake/Output Summary (Last 24 hours) at 01/22/16 1650 Last data filed at 01/22/16 1330  Gross per 24 hour  Intake    843 ml  Output   3075 ml  Net  -2232 ml   Filed Weights   01/21/16 1329 01/21/16 1602 01/22/16 0613  Weight: 96.219 kg (212 lb 2 oz) 94.076 kg (207 lb 6.4 oz) 93.214 kg (205 lb 8 oz)    Examination:  General exam: Appears calm and comfortable  Respiratory system: Clear to auscultation. Respiratory effort normal. Cardiovascular system: S1 & S2. Gastrointestinal system: Abdomen is nondistended, soft and nontender. No organomegaly or masses felt. Normal bowel sounds heard. Central nervous system: Alert and oriented. No focal neurological deficits. Extremities: Symmetric 5 x 5 power.  Data Reviewed: I have personally reviewed following labs and imaging studies  CBC:  Recent Labs Lab 01/21/16 1007  WBC 8.4  HGB 15.4  HCT 46.1  MCV 87.5  PLT 123456*   Basic Metabolic Panel:  Recent Labs Lab 01/21/16 1007 01/22/16 0540  NA 141 140  K 4.7 4.2  CL 107 104  CO2 24 25  GLUCOSE 114* 104*  BUN 17 20  CREATININE 1.26* 1.15  CALCIUM 9.2 9.0   GFR: Estimated Creatinine Clearance: 55.8 mL/min (by C-G formula based on Cr of 1.15). Liver Function Tests: No results for input(s): AST, ALT, ALKPHOS, BILITOT, PROT, ALBUMIN in the last 168 hours. No results for input(s): LIPASE, AMYLASE in the last 168 hours. No results for input(s): AMMONIA  in the last 168 hours. Coagulation Profile: No results for input(s): INR, PROTIME in the last 168 hours. Cardiac Enzymes:  Recent Labs Lab 01/21/16 1615 01/21/16 2105 01/22/16 0540  TROPONINI <0.03 <0.03 <0.03   BNP (last 3 results) No results for input(s): PROBNP in the last 8760 hours. HbA1C: No results for input(s): HGBA1C in the last 72 hours. CBG: No results for input(s): GLUCAP in the last 168 hours. Lipid Profile:  Recent Labs  01/22/16 0540  CHOL 166  HDL 35*  LDLCALC 116*  TRIG 74  CHOLHDL 4.7   Thyroid Function Tests: No results for input(s): TSH, T4TOTAL, FREET4, T3FREE, THYROIDAB in the last 72 hours. Anemia Panel: No results for input(s): VITAMINB12, FOLATE, FERRITIN, TIBC, IRON, RETICCTPCT in the last 72 hours. Urine analysis:    Component Value Date/Time   COLORURINE YELLOW 10/06/2009 1406   APPEARANCEUR CLEAR 10/06/2009 1406   LABSPEC 1.017 10/06/2009 1406   PHURINE 6.5 10/06/2009 1406   GLUCOSEU 100* 10/06/2009 1406   HGBUR LARGE* 10/06/2009 1406   BILIRUBINUR LARGE* 10/06/2009 1406   KETONESUR 15* 10/06/2009 1406   PROTEINUR 100* 10/06/2009 1406   UROBILINOGEN 1.0 10/06/2009 1406   NITRITE POSITIVE* 10/06/2009 1406   LEUKOCYTESUR LARGE* 10/06/2009 1406   Sepsis Labs: @LABRCNTIP (procalcitonin:4,lacticidven:4)  )No results found for this or any previous visit (from the past 240 hour(s)).       Radiology Studies: Dg Chest 2 View  01/21/2016  CLINICAL DATA:  Increasing shortness of breath. EXAM: CHEST  2 VIEW COMPARISON:  12/21/2015 and 10/06/2009 FINDINGS: There has been slight increase in the cardiac silhouette size since the prior study. New slight distention of the azygos vein. New small Kerley B-lines at the right lung base with a small posteriorly loculated right pleural effusion. No infiltrates. No acute osseous abnormality. Aortic atherosclerosis. IMPRESSION: Increasing heart size since the prior study with new Kerley B-lines and a  small right pleural effusion consistent with congestive heart failure. Aortic atherosclerosis. Electronically Signed   By: Lorriane Shire M.D.   On: 01/21/2016 10:35        Scheduled Meds: . carvedilol  3.125 mg Oral BID WC  . enoxaparin (LOVENOX) injection  40 mg Subcutaneous Q24H  . furosemide  20 mg Intravenous Daily  . potassium chloride  20 mEq Oral Daily  . sodium chloride flush  3 mL Intravenous Q12H   Continuous Infusions:    LOS: 0 days    Time spent: [place], MD [facility] Pager [phone]  If 7PM-7AM, please contact night-coverage [email or web address] Password [email or web address] 01/22/2016, 4:50 PM

## 2016-01-23 ENCOUNTER — Ambulatory Visit: Payer: Medicare Other | Admitting: Physician Assistant

## 2016-01-23 ENCOUNTER — Encounter (HOSPITAL_COMMUNITY)
Admission: EM | Disposition: A | Discharge status: Home / Self Care | Payer: Self-pay | Source: Home / Self Care | Attending: Internal Medicine

## 2016-01-23 DIAGNOSIS — I493 Ventricular premature depolarization: Secondary | ICD-10-CM

## 2016-01-23 DIAGNOSIS — I25110 Atherosclerotic heart disease of native coronary artery with unstable angina pectoris: Secondary | ICD-10-CM | POA: Insufficient documentation

## 2016-01-23 DIAGNOSIS — I2510 Atherosclerotic heart disease of native coronary artery without angina pectoris: Secondary | ICD-10-CM | POA: Insufficient documentation

## 2016-01-23 DIAGNOSIS — I119 Hypertensive heart disease without heart failure: Secondary | ICD-10-CM

## 2016-01-23 DIAGNOSIS — I200 Unstable angina: Secondary | ICD-10-CM

## 2016-01-23 HISTORY — PX: CARDIAC CATHETERIZATION: SHX172

## 2016-01-23 HISTORY — DX: Ventricular premature depolarization: I49.3

## 2016-01-23 LAB — BASIC METABOLIC PANEL
Anion gap: 7 (ref 5–15)
BUN: 20 mg/dL (ref 6–20)
CALCIUM: 9 mg/dL (ref 8.9–10.3)
CHLORIDE: 108 mmol/L (ref 101–111)
CO2: 26 mmol/L (ref 22–32)
CREATININE: 1.28 mg/dL — AB (ref 0.61–1.24)
GFR calc non Af Amer: 50 mL/min — ABNORMAL LOW (ref >60)
GFR, EST AFRICAN AMERICAN: 58 mL/min — AB (ref >60)
Glucose, Bld: 112 mg/dL — ABNORMAL HIGH (ref 65–99)
Potassium: 4.3 mmol/L (ref 3.5–5.1)
SODIUM: 141 mmol/L (ref 135–145)

## 2016-01-23 LAB — POCT I-STAT 3, ART BLOOD GAS (G3+)
ACID-BASE DEFICIT: 2 mmol/L (ref 0.0–2.0)
BICARBONATE: 23.1 meq/L (ref 20.0–24.0)
O2 Saturation: 91 %
PH ART: 7.374 (ref 7.350–7.450)
PO2 ART: 62 mmHg — AB (ref 80.0–100.0)
TCO2: 24 mmol/L (ref 0–100)
pCO2 arterial: 39.6 mmHg (ref 35.0–45.0)

## 2016-01-23 LAB — POCT I-STAT 3, VENOUS BLOOD GAS (G3P V)
Bicarbonate: 25.9 mEq/L — ABNORMAL HIGH (ref 20.0–24.0)
O2 SAT: 54 %
TCO2: 27 mmol/L (ref 0–100)
pCO2, Ven: 46.2 mmHg (ref 45.0–50.0)
pH, Ven: 7.356 — ABNORMAL HIGH (ref 7.250–7.300)
pO2, Ven: 30 mmHg — ABNORMAL LOW (ref 31.0–45.0)

## 2016-01-23 LAB — PROTIME-INR
INR: 1.22 (ref 0.00–1.49)
PROTHROMBIN TIME: 15.5 s — AB (ref 11.6–15.2)

## 2016-01-23 LAB — POCT ACTIVATED CLOTTING TIME
ACTIVATED CLOTTING TIME: 471 s
Activated Clotting Time: >1000 seconds

## 2016-01-23 SURGERY — RIGHT/LEFT HEART CATH AND CORONARY ANGIOGRAPHY
Anesthesia: LOCAL

## 2016-01-23 MED ORDER — FENTANYL CITRATE (PF) 100 MCG/2ML IJ SOLN
INTRAMUSCULAR | Status: AC
  Filled 2016-01-23: qty 2

## 2016-01-23 MED ORDER — LIDOCAINE HCL (PF) 1 % IJ SOLN
INTRAMUSCULAR | Status: DC | PRN
  Administered 2016-01-23: 15 mL
  Administered 2016-01-23: 5 mL

## 2016-01-23 MED ORDER — HEPARIN (PORCINE) IN NACL 2-0.9 UNIT/ML-% IJ SOLN
INTRAMUSCULAR | Status: AC
  Filled 2016-01-23: qty 1000

## 2016-01-23 MED ORDER — IOPAMIDOL (ISOVUE-370) INJECTION 76%
INTRAVENOUS | Status: AC
  Filled 2016-01-23: qty 100

## 2016-01-23 MED ORDER — ATORVASTATIN CALCIUM 80 MG PO TABS
80.0000 mg | ORAL_TABLET | Freq: Every day | ORAL | Status: DC
  Administered 2016-01-23: 80 mg via ORAL
  Filled 2016-01-23: qty 1

## 2016-01-23 MED ORDER — SODIUM CHLORIDE 0.9 % IV SOLN: INTRAVENOUS | Status: AC

## 2016-01-23 MED ORDER — CLOPIDOGREL BISULFATE 300 MG PO TABS
ORAL_TABLET | ORAL | Status: AC
  Filled 2016-01-23: qty 2

## 2016-01-23 MED ORDER — IOPAMIDOL (ISOVUE-370) INJECTION 76%
INTRAVENOUS | Status: DC | PRN
  Administered 2016-01-23: 190 mL via INTRA_ARTERIAL

## 2016-01-23 MED ORDER — CLOPIDOGREL BISULFATE 75 MG PO TABS
75.0000 mg | ORAL_TABLET | Freq: Every day | ORAL | Status: DC
  Administered 2016-01-24: 75 mg via ORAL
  Filled 2016-01-23: qty 1

## 2016-01-23 MED ORDER — LIDOCAINE HCL (PF) 1 % IJ SOLN
INTRAMUSCULAR | Status: AC
  Filled 2016-01-23: qty 30

## 2016-01-23 MED ORDER — ANGIOPLASTY BOOK
Freq: Once | Status: AC
  Administered 2016-01-23: 22:00:00
  Filled 2016-01-23: qty 1

## 2016-01-23 MED ORDER — SODIUM CHLORIDE 0.9% FLUSH: 3.0000 mL | INTRAVENOUS | Status: DC | PRN

## 2016-01-23 MED ORDER — SODIUM CHLORIDE 0.9% FLUSH
3.0000 mL | Freq: Two times a day (BID) | INTRAVENOUS | Status: DC
  Administered 2016-01-23: 18:00:00 3 mL via INTRAVENOUS

## 2016-01-23 MED ORDER — BIVALIRUDIN 250 MG IV SOLR
INTRAVENOUS | Status: AC
  Filled 2016-01-23: qty 250

## 2016-01-23 MED ORDER — CARVEDILOL 3.125 MG PO TABS
6.2500 mg | ORAL_TABLET | Freq: Two times a day (BID) | ORAL | Status: DC
  Administered 2016-01-23 – 2016-01-24 (×2): 6.25 mg via ORAL
  Filled 2016-01-23 (×2): qty 2

## 2016-01-23 MED ORDER — NITROGLYCERIN 1 MG/10 ML FOR IR/CATH LAB
INTRA_ARTERIAL | Status: AC
  Filled 2016-01-23: qty 10

## 2016-01-23 MED ORDER — MIDAZOLAM HCL 2 MG/2ML IJ SOLN
INTRAMUSCULAR | Status: AC
  Filled 2016-01-23: qty 2

## 2016-01-23 MED ORDER — FENTANYL CITRATE (PF) 100 MCG/2ML IJ SOLN
INTRAMUSCULAR | Status: DC | PRN
  Administered 2016-01-23: 25 ug via INTRAVENOUS

## 2016-01-23 MED ORDER — SODIUM CHLORIDE 0.9 % IV SOLN
250.0000 mg | INTRAVENOUS | Status: DC | PRN
  Administered 2016-01-23: 1.75 mg/kg/h via INTRAVENOUS

## 2016-01-23 MED ORDER — HEPARIN (PORCINE) IN NACL 2-0.9 UNIT/ML-% IJ SOLN
INTRAMUSCULAR | Status: DC | PRN
  Administered 2016-01-23: 1500 mL

## 2016-01-23 MED ORDER — ASPIRIN 81 MG PO CHEW
81.0000 mg | CHEWABLE_TABLET | Freq: Every day | ORAL | Status: DC
  Administered 2016-01-24: 11:00:00 81 mg via ORAL
  Filled 2016-01-23: qty 1

## 2016-01-23 MED ORDER — LIVING BETTER WITH HEART FAILURE BOOK
Freq: Once | Status: AC
  Administered 2016-01-23: 21:00:00

## 2016-01-23 MED ORDER — MIDAZOLAM HCL 2 MG/2ML IJ SOLN
INTRAMUSCULAR | Status: DC | PRN
  Administered 2016-01-23: 2 mg via INTRAVENOUS

## 2016-01-23 MED ORDER — CLOPIDOGREL BISULFATE 300 MG PO TABS
ORAL_TABLET | ORAL | Status: DC | PRN
  Administered 2016-01-23: 600 mg via ORAL

## 2016-01-23 MED ORDER — SODIUM CHLORIDE 0.9 % IV SOLN: 250.0000 mL | INTRAVENOUS | Status: DC | PRN

## 2016-01-23 MED ORDER — NITROGLYCERIN 1 MG/10 ML FOR IR/CATH LAB
INTRA_ARTERIAL | Status: DC | PRN
  Administered 2016-01-23 (×2): 200 ug via INTRACORONARY

## 2016-01-23 MED ORDER — BIVALIRUDIN BOLUS VIA INFUSION - CUPID
INTRAVENOUS | Status: DC | PRN
  Administered 2016-01-23: 70.2 mg via INTRAVENOUS

## 2016-01-23 MED ORDER — EXERCISE FOR HEART AND HEALTH BOOK
Freq: Once | Status: AC
  Administered 2016-01-23: 21:00:00
  Filled 2016-01-23: qty 1

## 2016-01-23 SURGICAL SUPPLY — 25 items
BALLN EMERGE MR 2.0X8 (BALLOONS) ×2
BALLN TREK RX 2.5X20 (BALLOONS) ×2
BALLN ~~LOC~~ EUPHORA RX 2.25X8 (BALLOONS) ×2
BALLN ~~LOC~~ EUPHORA RX 3.25X20 (BALLOONS) ×2
BALLOON EMERGE MR 2.0X8 (BALLOONS) IMPLANT
BALLOON TREK RX 2.5X20 (BALLOONS) IMPLANT
BALLOON ~~LOC~~ EUPHORA RX 2.25X8 (BALLOONS) IMPLANT
BALLOON ~~LOC~~ EUPHORA RX 3.25X20 (BALLOONS) IMPLANT
CATH INFINITI 5FR MULTPACK ANG (CATHETERS) ×1 IMPLANT
CATH SWAN GANZ 7F STRAIGHT (CATHETERS) ×1 IMPLANT
CATH VISTA GUIDE 6FR XBLAD3.5 (CATHETERS) ×1 IMPLANT
DEVICE WIRE ANGIOSEAL 6FR (Vascular Products) ×1 IMPLANT
KIT ENCORE 26 ADVANTAGE (KITS) ×1 IMPLANT
KIT HEART LEFT (KITS) ×2 IMPLANT
PACK CARDIAC CATHETERIZATION (CUSTOM PROCEDURE TRAY) ×2 IMPLANT
SHEATH PINNACLE 5F 10CM (SHEATH) ×1 IMPLANT
SHEATH PINNACLE 6F 10CM (SHEATH) ×1 IMPLANT
SHEATH PINNACLE 7F 10CM (SHEATH) ×1 IMPLANT
STENT PROMUS PREM MR 2.25X12 (Permanent Stent) ×1 IMPLANT
STENT PROMUS PREM MR 3.0X32 (Permanent Stent) ×1 IMPLANT
SYR MEDRAD MARK V 150ML (SYRINGE) ×2 IMPLANT
TRANSDUCER W/STOPCOCK (MISCELLANEOUS) ×3 IMPLANT
TUBING CIL FLEX 10 FLL-RA (TUBING) ×2 IMPLANT
WIRE COUGAR XT STRL 190CM (WIRE) ×1 IMPLANT
WIRE EMERALD 3MM-J .035X150CM (WIRE) ×1 IMPLANT

## 2016-01-23 NOTE — Interval H&P Note (Signed)
History and Physical Interval Note:  01/23/2016 2:24 PM  Patrick Maxwell  has presented today for cardiac cath with the diagnosis of cardiomyopathy, acute CHF.  The various methods of treatment have been discussed with the patient and family. After consideration of risks, benefits and other options for treatment, the patient has consented to  Procedure(s): Right/Left Heart Cath and Coronary Angiography (N/A) as a surgical intervention .  The patient's history has been reviewed, patient examined, no change in status, stable for surgery.  I have reviewed the patient's chart and labs.  Questions were answered to the patient's satisfaction.     Oral Remache

## 2016-01-23 NOTE — H&P (View-Only) (Signed)
CARDIOLOGY CONSULT NOTE   Patient ID: Patrick Maxwell MRN: [id number] DOB/AGE: [birth year] 82 y.o.  Admit date: 01/21/2016  Primary Physician   Karis Juba, PA-C Primary Cardiologist   Dr Gwenlyn Found Reason for Consultation   L arm pain, possible anginal equivalent  [id number] Patrick Maxwell is a 82 y.o. year old male with a history of CVA, HTN, HLD, PVCs.   [name] had a physically stressful and that in December with the move, and then had an upper respiratory illness in February. Since then, he noticed increased dyspnea on exertion. He was still maintaining a good activity level, working in the yard and walking for exercise up to a mile and a half, but he still felt his dyspnea on exertion was increased from baseline.  Starting March 2017, the dyspnea on exertion had progressed to the point that he was short of breath after taking a shower or other minor activities. He would also feel like he had trouble getting a deep breath. He had a mild cough that was nonproductive, no fevers or chills. He never had chest pain. Around this time, he also began noticing left upper arm pain. He would get it after he started walking, but it would resolve if he kept walking. He never took anything for the pain. It was mild, about a 3/10. There was no numbness or tingling.  Seen by primary care and scheduled for cardiology evaluation. He was given short-term Lasix, taking Lasix 20 mg 2 doses.  Evaluated by Dr Gwenlyn Found 12/25/2015, to establish care in [place]. He did not tell Dr. Gwenlyn Found about the dyspnea on exertion He had an echo with EF 30-35%, results below. Office visit to discuss results scheduled 05/04.  On the day of admission, he woke with left arm discomfort about 4 AM. He was also short of breath, which got better when he sat up in a chair. After his wife got up, they called the office and came to the hospital as scheduled. By the time he arrived to the emergency room, the arm pain had  resolved. He was in heart failure. Since he has been hospitalized, he has been diuresed with IV Lasix and lost about 7 pounds. He feels he is breathing much better. He has not had recurrence of the arm pain.   He is aware that his heart skips and was told he has PVCs years ago. He never gets lightheaded or short of breath from them.   Past Medical History  Diagnosis Date  . Frequent unifocal PVCs     Palpitations since early 20s, PVCs and pairs  . Stroke [facility]) 2009  . Hypertension   . Hyperlipidemia      Past Surgical History  Procedure Laterality Date  . Hernia repair  06/1992    No Known Allergies  I have reviewed the patient's current medications . atenolol  25 mg Oral Daily  . enoxaparin (LOVENOX) injection  40 mg Subcutaneous Q24H  . furosemide  20 mg Intravenous Daily  . potassium chloride  20 mEq Oral Daily  . sodium chloride flush  3 mL Intravenous Q12H     sodium chloride, acetaminophen, ondansetron (ZOFRAN) IV, sodium chloride flush  Prior to Admission medications   Medication Sig Start Date End Date Taking? Authorizing Provider  Ascorbic Acid (VITAMIN C PO) Take by mouth.   Yes Historical Provider, MD  atenolol (TENORMIN) 25 MG tablet Take 1 tablet (25 mg total) by mouth daily. 06/28/15  Yes Orlena Sheldon,  PA-C  Coenzyme Q10 (CO Q 10 PO) Take 1 capsule by mouth daily.    Yes Historical Provider, MD  Magnesium 100 MG TABS Take 1 tablet by mouth 2 (two) times daily.   Yes Historical Provider, MD  OVER THE COUNTER MEDICATION Take 1 tablet by mouth daily. "Prostate Formula" supplement   Yes Historical Provider, MD  Potassium 99 MG TABS Take 99 mg by mouth every other day.   Yes Historical Provider, MD  vitamin E 400 UNIT capsule Take 400 Units by mouth 3 (three) times a week.   Yes Historical Provider, MD  furosemide (LASIX) 20 MG tablet Take 1 tablet (20 mg total) by mouth daily. X 3 day only Patient not taking: Reported on 01/21/2016 12/21/15   Orlena Sheldon, PA-C    potassium chloride SA (K-DUR,KLOR-CON) 20 MEQ tablet Take 1 tablet (20 mEq total) by mouth daily. X 3 days only along with furosemide Patient not taking: Reported on 01/21/2016 12/21/15   Orlena Sheldon, PA-C     Social History   Social History  . Marital Status: Married    Spouse Name: N/A  . Number of Children: N/A  . Years of Education: N/A   Occupational History  . Not on file.   Social History Main Topics  . Smoking status: Never Smoker   . Smokeless tobacco: Never Used  . Alcohol Use: No  . Drug Use: No  . Sexual Activity: Not Currently   Other Topics Concern  . Not on file   Social History Narrative    Family Status  Relation Status Death Age  . Mother Deceased 89  . Brother Deceased 4  . Father Deceased 97  . Maternal Grandmother Deceased   . Maternal Grandfather Deceased   . Paternal Grandmother Deceased   . Paternal Grandfather Deceased   . Brother Deceased 52  . Brother Deceased 67   Family History  Problem Relation Age of Onset  . Stroke Mother   . Early death Mother   . Cancer Brother      ROS:  Full 14 point review of systems complete and found to be negative unless listed above.  Physical Exam: Blood pressure 123/59, pulse 76, temperature 98.3 F (36.8 C), temperature source Oral, resp. rate 18, height 5\' 9"  (1.753 m), weight 205 lb 8 oz (93.214 kg), SpO2 92 %.  General: Well developed, well nourished, male in no acute distress Head: Eyes PERRLA, No xanthomas.   Normocephalic and atraumatic, oropharynx without edema or exudate. Dentition: good Lungs: bilateral rales Heart: Heart regularly irregular with S1, S2, no murmur. pulses are 2+ all 4 extrem.   Neck: No carotid bruits. No lymphadenopathy.  JVD Not elevated. Abdomen: Bowel sounds present, abdomen soft and non-tender without masses or hernias noted. Msk:  No spine or cva tenderness. No weakness, no joint deformities or effusions. Extremities: No clubbing or cyanosis. No edema.  Neuro: Alert  and oriented X 3. No focal deficits noted. Psych:  Good affect, responds appropriately Skin: No rashes or lesions noted.  Labs:   Lab Results  Component Value Date   WBC 8.4 01/21/2016   HGB 15.4 01/21/2016   HCT 46.1 01/21/2016   MCV 87.5 01/21/2016   PLT 144* 01/21/2016     Recent Labs Lab 01/22/16 0540  NA 140  K 4.2  CL 104  CO2 25  BUN 20  CREATININE 1.15  CALCIUM 9.0  GLUCOSE 104*    Recent Labs  01/21/16 1615 01/21/16 2105 01/22/16  0540  TROPONINI <0.03 <0.03 <0.03    Recent Labs  01/21/16 1121  TROPIPOC 0.02   B NATRIURETIC PEPTIDE  Date/Time Value Ref Range Status  01/21/2016 10:07 AM 1049.6* 0.0 - 100.0 pg/mL Final   Lab Results  Component Value Date   CHOL 166 01/22/2016   HDL 35* 01/22/2016   LDLCALC 116* 01/22/2016   TRIG 74 01/22/2016   TSH  Date/Time Value Ref Range Status  12/19/2015 03:28 PM 4.57* 0.40 - 4.50 mIU/L Final    Echo: 01/07/2016 - Left ventricle: The cavity size was normal. Wall thickness was  increased in a pattern of mild LVH. Systolic function was  moderately to severely reduced. The estimated ejection fraction  was in the range of 30% to 35%. Diffuse hypokinesis. Features are  consistent with a pseudonormal left ventricular filling pattern,  with concomitant abnormal relaxation and increased filling  pressure (grade 2 diastolic dysfunction). - Aortic valve: There was no stenosis. There was mild  regurgitation. - Mitral valve: Mildly calcified annulus. There was mild  regurgitation. - Left atrium: The atrium was mildly dilated. - Right ventricle: Poorly visualized. The cavity size was mildly  dilated. Systolic function was mildly reduced. - Tricuspid valve: Peak RV-RA gradient (S): 20 mm Hg. - Pulmonary arteries: PA peak pressure: 23 mm Hg (S). - Inferior vena cava: The vessel was normal in size. The  respirophasic diameter changes were in the normal range (= 50%),  consistent with normal central  venous pressure. Impressions: - Technically difficult study with poor acoustic windows. Echo  contrast likely would have helped but was not used. Normal LV  size with mild LV hypertrophy. EF 30-35%, diffuse hypokinesis.  Mildly dilated RV with mildly decreased systolic function. Mild  mitral regurgitation.  ECG:  01/21/2016 Sinus rhythm, frequent PVCs and pairs Inferior ST segment changes, no significant difference from 12/25/2015  Radiology:  Dg Chest 2 View 01/21/2016  CLINICAL DATA:  Increasing shortness of breath. EXAM: CHEST  2 VIEW COMPARISON:  12/21/2015 and 10/06/2009 FINDINGS: There has been slight increase in the cardiac silhouette size since the prior study. New slight distention of the azygos vein. New small Kerley B-lines at the right lung base with a small posteriorly loculated right pleural effusion. No infiltrates. No acute osseous abnormality. Aortic atherosclerosis. IMPRESSION: Increasing heart size since the prior study with new Kerley B-lines and a small right pleural effusion consistent with congestive heart failure. Aortic atherosclerosis. Electronically Signed   By: Lorriane Shire M.D.   On: 01/21/2016 10:35    ASSESSMENT AND PLAN:   The patient was seen today by Dr Burt Knack, the patient evaluated and the data reviewed.   1.   Acute combined systolic and diastolic congestive heart failure (HCC) - New dx, unclear if LVD is ischemic or nonischemic - volume status much improved - feel he is appropriate for cath from a respiratory standpoint - needs cath to evaluate for ischemic heart disease  2.  AKI (acute kidney injury) ([place]).  - peak BUN 17, peak Cr 1.26 with diuresis - BUN/Cr now essentially the same as on admission - no hx renal problems  3.  Hypertension, benign - Pt has been reluctant to take additional BP meds in the past, has been on atenolol  Otherwise, per IM.  Active Problems:   History of CVA (cerebrovascular accident) without residual deficits    Subclinical hypothyroidism   CHF (congestive heart failure) ([place])    SignedRosaria Ferries, PA-C 01/22/2016 1:25 PM Beeper [id number]  Co-Sign  MD  Patient seen, examined. Available data reviewed. Agree with findings, assessment, and plan as outlined by Rosaria Ferries, PA-C. The patient is independently interviewed and examined. Lab and echo data is reviewed. Exam reveals an alert, oriented male in no distress. Lungs are clear. Heart is regular rate and rhythm. Abdomen is soft and nontender. Jugular venous pressure is mildly elevated. There is no pretibial edema present. EKG shows sinus rhythm with frequent PVCs. The patient has acute systolic heart failure with severe LV dysfunction documented by echocardiography. I had a lengthy discussion with the patient and his family today. They understand the differential diagnosis includes ischemic versus nonischemic cardiomyopathies. Right and left heart catheterization are indicated. I have reviewed the risks, indications, and alternatives to cardiac catheterization, possible angioplasty, and stenting with the patient. Risks include but are not limited to bleeding, infection, vascular injury, stroke, myocardial infection, arrhythmia, kidney injury, radiation-related injury in the case of prolonged fluoroscopy use, emergency cardiac surgery, and death. The patient understands the risks of serious complication is 1-2 in [id number] with diagnostic cardiac cath and 1-2% or less with angioplasty/stenting.   If he has no CAD, it is possible the frequent PVC's are contributing to his cardiomyopathy. In addition, he noted clinical worsening of his dyspnea after a viral infection last year. Will re-evaluate after his cath is completed.   Regarding medical therapy, will stop atenolol and start carvedilol 3.125 mg BID. Start ACE after cardiac catheterization. Titrate meds as tolerated.   Sherren Mocha, M.D. 01/22/2016 4:23 PM

## 2016-01-23 NOTE — Research (Signed)
[facility] Informed Consent   Subject Name: NERO SAWATZKY  Subject met inclusion and exclusion criteria.  The informed consent form, study requirements and expectations were reviewed with the subject and questions and concerns were addressed prior to the signing of the consent form.  The subject verbalized understanding of the trial requirements.  The subject agreed to participate in the [facility] trial and signed the informed consent.  The informed consent was obtained prior to performance of any protocol-specific procedures for the subject.  A copy of the signed informed consent was given to the subject and a copy was placed in the subject's medical record.  Marlana Salvage 01/23/2016, 07:30

## 2016-01-23 NOTE — H&P (Signed)
PROGRESS NOTE    Patrick Maxwell  [id number] DOB: Mar 23, 1931 DOA: 01/21/2016 PCP: Karis Juba, PA-C    Outpatient Specialists:    Brief Narrative: 82 y.o. male with medical history significant for HTN, hyperlipidemia, remote CVA and history of an irregular heartbeat since his twenties. PCP referred patient to [facility] and he was seen by Dr. Gwenlyn Found 12/25/15. Echocardiogram done 01/07/16 revealed ejection fraction of 99991111, grade 2 diastolic dysfunction. Patient was to follow up with cardiology for echo reports but in the interim developed worsening dyspnea. Over the last 2-3 days patient has become orthopneic with wheezing. He has been sleeping in a recliner. He reported left upper extremity discomfort today. Patient states he actually gets discomfort in his left upper extremity when physically exerting himself. The discomfort is mainly from the shoulder to the elbow and resolves with rest (worrisome for Angina equivalent). Cardiology consulted with the patient and recommended cardiac catheterization. There will be need to monitor renal function post cardiac catheterization as patient is at risk for contrast induced nephropathy.  Assessment/Plan Active Problems:  History of CVA (cerebrovascular accident) without residual deficits  Hypertension, benign  Subclinical hypothyroidism  AKI (acute kidney injury) ([place])    Combined systolic and diastolic heart failure. Symptoms improved significantly with diuresis. Etiology of CHF is undetermined, but worrisome for possible underlying ischemia. For Cardiac catheterization today.  AKI - Resolved significantly. On IVF at 40cc/hr for renal prophylaxis as patient will be undergoing cardiac catheterization today. Patient and patient's family (Wife and Son) understand the patient is at risk for contrast induced nephropathy.  Hypercholesterolemia.   Hx of CVA (per patient report). No residual deficits noted   DVT prophylaxis: Lovenox Code  Status: Full code Family Communication: Spoke with wife in room. She understands plan to keep patient at least overnight.  Disposition Plan: Home in 24-48 hours Consults called: None Admission status: Observation - Telemetry  Consultants:   Cardiology   Subjective: SOB has resolved significantly. Tells me that the left arm pain is also improving. No fever or chills.  Objective: Filed Vitals:   01/23/16 0515 01/23/16 0813 01/23/16 1200 01/23/16 1428  BP: 152/82 156/98 155/88   Pulse:  84 71   Temp: 97.7 F (36.5 C)  97.7 F (36.5 C)   TempSrc: Oral  Oral   Resp:   18   Height:      Weight: 93.622 kg (206 lb 6.4 oz)     SpO2: 98%  97% 97%    Intake/Output Summary (Last 24 hours) at 01/23/16 1455 Last data filed at 01/23/16 1000  Gross per 24 hour  Intake      0 ml  Output    650 ml  Net   -650 ml   Filed Weights   01/21/16 1602 01/22/16 0613 01/23/16 0515  Weight: 94.076 kg (207 lb 6.4 oz) 93.214 kg (205 lb 8 oz) 93.622 kg (206 lb 6.4 oz)    Examination:  General exam: Appears calm and comfortable  Respiratory system: Clear to auscultation. Respiratory effort normal. Cardiovascular system: S1 & S2. Gastrointestinal system: Abdomen is nondistended, soft and nontender. No organomegaly or masses felt. Normal bowel sounds heard. Central nervous system: Alert and oriented. No focal neurological deficits. Extremities: Symmetric 5 x 5 power.  Data Reviewed: I have personally reviewed following labs and imaging studies  CBC:  Recent Labs Lab 01/21/16 1007  WBC 8.4  HGB 15.4  HCT 46.1  MCV 87.5  PLT 123456*   Basic Metabolic Panel:  Recent Labs  Lab 01/21/16 1007 01/22/16 0540 01/23/16 0720  NA 141 140 141  K 4.7 4.2 4.3  CL 107 104 108  CO2 24 25 26   GLUCOSE 114* 104* 112*  BUN 17 20 20   CREATININE 1.26* 1.15 1.28*  CALCIUM 9.2 9.0 9.0   GFR: Estimated Creatinine Clearance: 50.3 mL/min (by C-G formula based on Cr of 1.28). Liver Function Tests: No  results for input(s): AST, ALT, ALKPHOS, BILITOT, PROT, ALBUMIN in the last 168 hours. No results for input(s): LIPASE, AMYLASE in the last 168 hours. No results for input(s): AMMONIA in the last 168 hours. Coagulation Profile:  Recent Labs Lab 01/23/16 0720  INR 1.22   Cardiac Enzymes:  Recent Labs Lab 01/21/16 1615 01/21/16 2105 01/22/16 0540  TROPONINI <0.03 <0.03 <0.03   BNP (last 3 results) No results for input(s): PROBNP in the last 8760 hours. HbA1C: No results for input(s): HGBA1C in the last 72 hours. CBG: No results for input(s): GLUCAP in the last 168 hours. Lipid Profile:  Recent Labs  01/22/16 0540  CHOL 166  HDL 35*  LDLCALC 116*  TRIG 74  CHOLHDL 4.7   Thyroid Function Tests: No results for input(s): TSH, T4TOTAL, FREET4, T3FREE, THYROIDAB in the last 72 hours. Anemia Panel: No results for input(s): VITAMINB12, FOLATE, FERRITIN, TIBC, IRON, RETICCTPCT in the last 72 hours. Urine analysis:    Component Value Date/Time   COLORURINE YELLOW 10/06/2009 1406   APPEARANCEUR CLEAR 10/06/2009 1406   LABSPEC 1.017 10/06/2009 1406   PHURINE 6.5 10/06/2009 1406   GLUCOSEU 100* 10/06/2009 1406   HGBUR LARGE* 10/06/2009 1406   BILIRUBINUR LARGE* 10/06/2009 1406   KETONESUR 15* 10/06/2009 1406   PROTEINUR 100* 10/06/2009 1406   UROBILINOGEN 1.0 10/06/2009 1406   NITRITE POSITIVE* 10/06/2009 1406   LEUKOCYTESUR LARGE* 10/06/2009 1406   Sepsis Labs: @LABRCNTIP (procalcitonin:4,lacticidven:4)  )No results found for this or any previous visit (from the past 240 hour(s)).       Radiology Studies: No results found.      Scheduled Meds: . [MAR Hold] aspirin  81 mg Oral Daily  . [MAR Hold] carvedilol  6.25 mg Oral BID WC  . [MAR Hold] enoxaparin (LOVENOX) injection  40 mg Subcutaneous Q24H  . [MAR Hold] furosemide  20 mg Intravenous Daily  . [MAR Hold] potassium chloride  20 mEq Oral Daily  . [MAR Hold] sodium chloride flush  3 mL Intravenous Q12H     Continuous Infusions: . sodium chloride 40 mL/hr at 01/23/16 0600     LOS: 1 day    Time spent: [place], MD [facility] Pager [phone]  If 7PM-7AM, please contact night-coverage [email or web address] Password [email or web address] 01/23/2016, 2:55 PM

## 2016-01-23 NOTE — Progress Notes (Signed)
Site area: right groin  Site Prior to Removal:  Level 0  Pressure Applied For 10 MINUTES    Minutes Beginning at 1820  Manual:   Yes.    Patient Status During Pull:  AAOX4  Post Pull Groin Site:  Level 0  Post Pull Instructions Given:  Yes.    Post Pull Pulses Present:  Yes.    Dressing Applied:  Yes.    Comments:  VENOUS SHEATH PULL ,TOLERATED PROCEDURE WELL

## 2016-01-23 NOTE — Progress Notes (Signed)
Patient Name: Patrick Maxwell Date of Encounter: 01/23/2016  Hospital Problem List     Principal Problem:   Acute combined systolic and diastolic congestive heart failure (HCC) Active Problems:   Unstable angina (HCC)   Palpitations   Hypertensive heart disease   PVC's (premature ventricular contractions)   History of CVA (cerebrovascular accident) without residual deficits   Hypercholesteremia   Subclinical hypothyroidism   AKI (acute kidney injury) ([place])    Subjective   No chest pain or sob.  Able to lie flat w/o any issues.  Awaiting R and L heart cath today.  He and wife have multiple questions re: cath and hospitalization.  Inpatient Medications    . carvedilol  3.125 mg Oral BID WC  . enoxaparin (LOVENOX) injection  40 mg Subcutaneous Q24H  . furosemide  20 mg Intravenous Daily  . potassium chloride  20 mEq Oral Daily  . sodium chloride flush  3 mL Intravenous Q12H    Vital Signs    Filed Vitals:   01/22/16 2015 01/23/16 0515 01/23/16 0813 01/23/16 1200  BP: 148/66 152/82 156/98 155/88  Pulse: 77  84 71  Temp: 98.1 F (36.7 C) 97.7 F (36.5 C)  97.7 F (36.5 C)  TempSrc: Oral Oral  Oral  Resp: 16   18  Height:      Weight:  206 lb 6.4 oz (93.622 kg)    SpO2: 97% 98%  97%    Intake/Output Summary (Last 24 hours) at 01/23/16 1250 Last data filed at 01/23/16 1000  Gross per 24 hour  Intake    360 ml  Output   1600 ml  Net  -1240 ml   Filed Weights   01/21/16 1602 01/22/16 0613 01/23/16 0515  Weight: 207 lb 6.4 oz (94.076 kg) 205 lb 8 oz (93.214 kg) 206 lb 6.4 oz (93.622 kg)    Physical Exam    General: Pleasant, NAD. Neuro: Alert and oriented X 3. Moves all extremities spontaneously. Psych: Normal affect. HEENT:  Normal  Neck: Supple without bruits or JVD. Lungs:  Resp regular and unlabored, CTA. Heart: Irreg, no s3, s4, or murmurs. Abdomen: Soft, non-tender, non-distended, BS + x 4.  Extremities: No clubbing, cyanosis or edema.  DP/PT/Radials 2+ and equal bilaterally.  Labs    CBC  Recent Labs  01/21/16 1007  WBC 8.4  HGB 15.4  HCT 46.1  MCV 87.5  PLT 123456*   Basic Metabolic Panel  Recent Labs  01/22/16 0540 01/23/16 0720  NA 140 141  K 4.2 4.3  CL 104 108  CO2 25 26  GLUCOSE 104* 112*  BUN 20 20  CREATININE 1.15 1.28*  CALCIUM 9.0 9.0   Cardiac Enzymes  Recent Labs  01/21/16 1615 01/21/16 2105 01/22/16 0540  TROPONINI <0.03 <0.03 <0.03   Fasting Lipid Panel  Recent Labs  01/22/16 0540  CHOL 166  HDL 35*  LDLCALC 116*  TRIG 74  CHOLHDL 4.7    Telemetry    Rsr, freq pvc's, bigeminy  Radiology    Dg Chest 2 View  01/21/2016  CLINICAL DATA:  Increasing shortness of breath. EXAM: CHEST  2 VIEW COMPARISON:  12/21/2015 and 10/06/2009 FINDINGS: There has been slight increase in the cardiac silhouette size since the prior study. New slight distention of the azygos vein. New small Kerley B-lines at the right lung base with a small posteriorly loculated right pleural effusion. No infiltrates. No acute osseous abnormality. Aortic atherosclerosis. IMPRESSION: Increasing heart size since the prior study  with new Kerley B-lines and a small right pleural effusion consistent with congestive heart failure. Aortic atherosclerosis. Electronically Signed   By: Lorriane Shire M.D.   On: 01/21/2016 10:35    Assessment & Plan    1.  Acute combined systolic and diastolic chf/cardiomyopathy (? Isch/ vs non-isch):  Pt presented with dyspnea and left arm pain on 5/1 and was found to be volume overloaded with CHF on cxr and BNP of 1049.  He has been diuresed and is now minus 4.2 L since admission (minus 1.2 L overnight).  Interestingly, wt is listed as being up 1 lb since yesterday.  Euvolemic on exam and creat up slightly.  R & L heart cath today to assess filling pressures.  Suspect we can switch to PO lasix but will await hemodynamics.  Titrate  blocker given ongoing HTN and PVC's.  Plan to ACEI tomorrow  if creat stable post-cath.  2.  Left Arm Pain/? Anginal equivalent:  Trop neg.  No recurrent pain.  Cont  blocker.  Add asa.  If CAD present on cath  add statin (LDL 116).   3.  Hypertensive heart Disease:  BP's trending 150's.  Titrate coreg to 6.25 bid.  4.  PVC's:  Freq pvc's on tele.  Freq couplets.  No sustained VT.  Titrate  blocker.  If no significant CAD - ? Role of PVC's in cardiomyopathy.  Will likely need 24 holter to quantify burden and determine if antiarrhythmic therapy appropriate.  5.  HL:  LDL 116.  Cath today.  If CAD present - add statin.  6.  Mild renal insufficiency:  Creat up slightly.  He already received lasix this AM.  Await PCWP/LVEDP.  Signed, Murray Hodgkins NP  I have personally seen and examined this patient with Ignacia Bayley, NP. I agree with the assessment and plan as outlined above. Plan right and left heart cath today to exclude obstructive CAD, assess filling pressures. Pt reports improvement in dyspnea with diuresis.   MCALHANY,CHRISTOPHER 01/23/2016'2:25 PM

## 2016-01-23 NOTE — Progress Notes (Signed)
Paged team 9 per Care Teams schedule to inform Dr. Richmond Campbell of pt's BS 124 and on glucomander.  Awaiting response.  Will continue to monitor.  Eathan Groman,RN

## 2016-01-24 ENCOUNTER — Ambulatory Visit: Payer: Medicare Other | Admitting: Cardiovascular Disease

## 2016-01-24 ENCOUNTER — Encounter (HOSPITAL_COMMUNITY): Payer: Self-pay | Admitting: Cardiovascular Disease

## 2016-01-24 ENCOUNTER — Other Ambulatory Visit: Payer: Self-pay | Admitting: Physician Assistant

## 2016-01-24 DIAGNOSIS — I200 Unstable angina: Secondary | ICD-10-CM

## 2016-01-24 DIAGNOSIS — I2510 Atherosclerotic heart disease of native coronary artery without angina pectoris: Secondary | ICD-10-CM

## 2016-01-24 LAB — BASIC METABOLIC PANEL
Anion gap: 12 (ref 5–15)
BUN: 23 mg/dL — ABNORMAL HIGH (ref 6–20)
CO2: 23 mmol/L (ref 22–32)
CREATININE: 1.29 mg/dL — AB (ref 0.61–1.24)
Calcium: 8.8 mg/dL — ABNORMAL LOW (ref 8.9–10.3)
Chloride: 102 mmol/L (ref 101–111)
GFR, EST AFRICAN AMERICAN: 58 mL/min — AB (ref >60)
GFR, EST NON AFRICAN AMERICAN: 50 mL/min — AB (ref >60)
Glucose, Bld: 101 mg/dL — ABNORMAL HIGH (ref 65–99)
POTASSIUM: 4.1 mmol/L (ref 3.5–5.1)
SODIUM: 137 mmol/L (ref 135–145)

## 2016-01-24 LAB — CBC
HEMATOCRIT: 44.2 % (ref 39.0–52.0)
Hemoglobin: 14.8 g/dL (ref 13.0–17.0)
MCH: 29.1 pg (ref 26.0–34.0)
MCHC: 33.5 g/dL (ref 30.0–36.0)
MCV: 86.8 fL (ref 78.0–100.0)
PLATELETS: 138 10*3/uL — AB (ref 150–400)
RBC: 5.09 MIL/uL (ref 4.22–5.81)
RDW: 14.1 % (ref 11.5–15.5)
WBC: 7.8 10*3/uL (ref 4.0–10.5)

## 2016-01-24 MED ORDER — POTASSIUM CHLORIDE CRYS ER 20 MEQ PO TBCR: 20.0000 meq | EXTENDED_RELEASE_TABLET | Freq: Every day | ORAL | Status: DC

## 2016-01-24 MED ORDER — ATORVASTATIN CALCIUM 80 MG PO TABS: 80.0000 mg | ORAL_TABLET | Freq: Every day | ORAL | Status: DC

## 2016-01-24 MED ORDER — CLOPIDOGREL BISULFATE 75 MG PO TABS
75.0000 mg | ORAL_TABLET | Freq: Every day | ORAL | Status: DC
Start: 2016-01-24 — End: 2016-02-20

## 2016-01-24 MED ORDER — FUROSEMIDE 20 MG PO TABS
20.0000 mg | ORAL_TABLET | Freq: Every day | ORAL | Status: DC
Start: 2016-01-24 — End: 2016-01-24

## 2016-01-24 MED ORDER — LISINOPRIL 2.5 MG PO TABS: 2.5000 mg | ORAL_TABLET | Freq: Every day | ORAL | Status: DC

## 2016-01-24 MED ORDER — CARVEDILOL 6.25 MG PO TABS: 6.2500 mg | ORAL_TABLET | Freq: Two times a day (BID) | ORAL | Status: DC

## 2016-01-24 MED ORDER — ASPIRIN 81 MG PO CHEW: 81.0000 mg | CHEWABLE_TABLET | Freq: Every day | ORAL | Status: DC

## 2016-01-24 MED ORDER — LISINOPRIL 5 MG PO TABS
2.5000 mg | ORAL_TABLET | Freq: Every day | ORAL | Status: DC
  Administered 2016-01-24: 11:00:00 2.5 mg via ORAL
  Filled 2016-01-24: qty 1

## 2016-01-24 MED ORDER — FUROSEMIDE 20 MG PO TABS: 20.0000 mg | ORAL_TABLET | Freq: Every day | ORAL | Status: DC

## 2016-01-24 NOTE — Progress Notes (Signed)
Patient Name: Patrick Maxwell Date of Encounter: 01/24/2016  Principal Problem:   Acute combined systolic and diastolic congestive heart failure ([place]) Active Problems:   History of CVA (cerebrovascular accident) without residual deficits   Hypercholesteremia   Subclinical hypothyroidism   Palpitations   AKI (acute kidney injury) ([place])   Hypertensive heart disease   PVC's (premature ventricular contractions)   Unstable angina ([place])   Coronary artery disease involving native coronary artery of native heart with unstable angina pectoris [facility])   Primary Cardiologist: Dr. Gwenlyn Found Patient Profile: Patrick Maxwell is a 82 year old male with a past medical history of systolic and diastolic CHF, CVA, HLD, AKI, HTN, and CAD. He presented with dyspnea and volume overload on 01/21/16, Echo showed EF of 30-35%, he underwent left and right heart cath on 01/23/16.  PCI to LAD, and OM1.   SUBJECTIVE: Feels well, no CP or SOB. Has walked with cardiac rehab this am and did well.    OBJECTIVE Filed Vitals:   01/23/16 2100 01/23/16 2200 01/24/16 0426 01/24/16 0751  BP: 97/44 94/48 154/59   Pulse: 39 36 80   Temp:   98.5 F (36.9 C) 97.9 F (36.6 C)  TempSrc:   Oral Oral  Resp: 14 19 17    Height:      Weight:   203 lb 4.2 oz (92.2 kg)   SpO2: 94% 96% 92%     Intake/Output Summary (Last 24 hours) at 01/24/16 0837 Last data filed at 01/24/16 0427  Gross per 24 hour  Intake    480 ml  Output   1600 ml  Net  -1120 ml   Filed Weights   01/22/16 0613 01/23/16 0515 01/24/16 0426  Weight: 205 lb 8 oz (93.214 kg) 206 lb 6.4 oz (93.622 kg) 203 lb 4.2 oz (92.2 kg)    PHYSICAL EXAM General: Well developed, well nourished, male in no acute distress. Head: Normocephalic, atraumatic.  Neck: Supple without bruits, No JVD. Lungs:  Resp regular and unlabored, CTA. Heart: RRR, S1, S2, no S3, S4, or murmur; no rub. Abdomen: Soft, non-tender, non-distended, BS + x 4.  Extremities: No clubbing,  cyanosis, No edema.  Neuro: Alert and oriented X 3. Moves all extremities spontaneously. Psych: Normal affect.  LABS: CBC: Recent Labs  01/21/16 1007 01/24/16 0424  WBC 8.4 7.8  HGB 15.4 14.8  HCT 46.1 44.2  MCV 87.5 86.8  PLT 144* 138*   INR: Recent Labs  01/23/16 0720  INR [id number]   Basic Metabolic Panel: Recent Labs  01/23/16 0720 01/24/16 0424  NA 141 137  K 4.3 4.1  CL 108 102  CO2 26 23  GLUCOSE 112* 101*  BUN 20 23*  CREATININE 1.28* 1.29*  CALCIUM 9.0 8.8*   Cardiac Enzymes: Recent Labs  01/21/16 1615 01/21/16 2105 01/22/16 0540  TROPONINI <0.03 <0.03 <0.03    Recent Labs  01/21/16 1121  TROPIPOC 0.02   BNP:  B NATRIURETIC PEPTIDE  Date/Time Value Ref Range Status  01/21/2016 10:07 AM 1049.6* 0.0 - 100.0 pg/mL Final   Fasting Lipid Panel: Recent Labs  01/22/16 0540  CHOL 166  HDL 35*  LDLCALC 116*  TRIG 74  CHOLHDL 4.7     Current facility-administered medications:  .  0.9 %  sodium chloride infusion, 250 mL, Intravenous, PRN, Willia Craze, NP .  0.9 %  sodium chloride infusion, 250 mL, Intravenous, PRN, Burnell Blanks, MD .  acetaminophen (TYLENOL) tablet 650 mg, 650 mg, Oral,  Q4H PRN, Willia Craze, NP .  aspirin chewable tablet 81 mg, 81 mg, Oral, Daily, Rogelia Mire, NP .  atorvastatin (LIPITOR) tablet 80 mg, 80 mg, Oral, q1800, Burnell Blanks, MD, 80 mg at 01/23/16 1819 .  carvedilol (COREG) tablet 6.25 mg, 6.25 mg, Oral, BID WC, Rogelia Mire, NP, 6.25 mg at 01/24/16 0758 .  clopidogrel (PLAVIX) tablet 75 mg, 75 mg, Oral, Q breakfast, Burnell Blanks, MD, 75 mg at 01/24/16 0758 .  furosemide (LASIX) injection 20 mg, 20 mg, Intravenous, Daily, Willia Craze, NP, 20 mg at 01/23/16 1800 .  ondansetron (ZOFRAN) injection 4 mg, 4 mg, Intravenous, Q6H PRN, Willia Craze, NP .  potassium chloride SA (K-DUR,KLOR-CON) CR tablet 20 mEq, 20 mEq, Oral, Daily, Willia Craze, NP, 20 mEq at  01/23/16 1819 .  sodium chloride flush (NS) 0.9 % injection 3 mL, 3 mL, Intravenous, Q12H, Willia Craze, NP, 3 mL at 01/23/16 2200 .  sodium chloride flush (NS) 0.9 % injection 3 mL, 3 mL, Intravenous, PRN, Willia Craze, NP .  sodium chloride flush (NS) 0.9 % injection 3 mL, 3 mL, Intravenous, Q12H, Burnell Blanks, MD, 3 mL at 01/23/16 1730 .  sodium chloride flush (NS) 0.9 % injection 3 mL, 3 mL, Intravenous, PRN, Burnell Blanks, MD    TELE: NSR, frequent PVC's.     ECG: NSR, frequent PVC's   Right/Left Heart Cath and Coronary Angiography 01/23/16  Mid LAD lesion, 99% stenosed.  Mid LAD to Dist LAD lesion, 20% stenosed.  Mid RCA to Dist RCA lesion, 20% stenosed.  RPDA lesion, 20% stenosed.  Ost LAD to Prox LAD lesion, 50% stenosed. Post intervention, there is a 0% residual stenosis.  2nd Mrg lesion, 80% stenosed. Post intervention, there is a 0% residual stenosis.  1. Severe double vessel CAD 2. Unstable angina.  3. Ischemic cardiomyopathy 4. Severe stenosis mid LAD. Now s/p successful PTCA/DES x 1 proximal and mid LAD 5. Severe stenosis mid obtuse marginal. Now s/p successful PTCA/DES x 1 OM1.   Current Medications:  . aspirin  81 mg Oral Daily  . atorvastatin  80 mg Oral q1800  . carvedilol  6.25 mg Oral BID WC  . clopidogrel  75 mg Oral Q breakfast  . furosemide  20 mg Intravenous Daily  . potassium chloride  20 mEq Oral Daily  . sodium chloride flush  3 mL Intravenous Q12H  . sodium chloride flush  3 mL Intravenous Q12H      ASSESSMENT AND PLAN: Principal Problem:   Acute combined systolic and diastolic congestive heart failure (HCC) Active Problems:   History of CVA (cerebrovascular accident) without residual deficits   Hypercholesteremia   Subclinical hypothyroidism   Palpitations   AKI (acute kidney injury) ([place])   Hypertensive heart disease   PVC's (premature ventricular contractions)   Unstable angina (HCC)   Coronary artery  disease involving native coronary artery of native heart with unstable angina pectoris ([place])   1. Acute combined systolic and diastolic chf/cardiomyopathy :Pt presented with dyspnea and left arm pain on 5/1 and was found to be volume overloaded with CHF on cxr and BNP of 1049. He has been diuresed and is now minus 5.3 L since admission (minus 1.3 L overnight). Weight is down 3 pounds from yesterday. Euvolemic on exam.  Creatinine is stable from yesterday following R & L heart cath. Getting IV Lasix 20mg  daily. Can switch to po today. Consider starting ACEI given reduced  EF, and recheck BMP in outpatient setting.  2. CAD s/p PCI: successful DES to prox and mid LAD and DES to OM1. He will need DAPT with ASA and Plavix x 12 months. He is on beta blocker.  He is on high intensity statin.   3. Hypertensive heart disease: BP is labile. BP's soft last night, this am it is elevated in 150's.  He would benefit from the addition of  ACEI given his CHF .   4. Frequent PVC's: patient on beta blocker, denies palpitations. Can eventually increase Coreg to 12.5 BID outpatient.   Signed, Arbutus Leas , NP 8:37 AM 01/24/2016 Pager [phone]  Patient seen, examined. Available data reviewed. Agree with findings, assessment, and plan as outlined by Jettie Booze, NP. Exam reveals clear lung fields, heart regular rate and rhythm, radial site clear. Patient has been started on Coreg and lisinopril at low-dose. His blood pressures have been somewhat labile and I would continue his current doses until he is seen in follow-up. He should be on dual antiplatelet therapy with aspirin and Plavix for 12 months. Outpatient follow-up with Dr. Gwenlyn Found or his PA/NP in 1-2 weeks. Would repeat echo in 3 months to reassess LV function.  Sherren Mocha, M.D. 01/24/2016 10:34 AM

## 2016-01-24 NOTE — Progress Notes (Signed)
CARDIAC REHAB PHASE I   PRE:  Rate/Rhythm: 93 SR c/ PVCs  BP:  Sitting: 156/80        SaO2: 98 RA  MODE:  Ambulation: 600 ft   POST:  Rate/Rhythm: 98 SR c/ PVCs  BP:  Sitting: 147/78         SaO2: 96 RA  Pt ambulated 600 ft on RA, independent, steady gait, tolerated well, no complaints. Completed PCI/stent/HF education with pt, pt daughter and daughter-in-law at bedside.  Reviewed risk factors, anti-platelet therapy, stent card, activity restrictions, ntg, exercise, heart healthy diet, sodium and fluid restrictions, CHF booklet and zone tool, daily weights and phase 2 cardiac rehab. Pt verbalized understanding. Pt agrees to phase 2 cardiac rehab referral, will send to [facility] per pt request. Pt to recliner after walk, call bell within reach, family at bedside.   [place], RN, BSN 01/24/2016 9:35 AM

## 2016-01-24 NOTE — Progress Notes (Signed)
Heart Failure Navigator Consult Note  Presentation: Patrick Maxwell is a 82 y.o. male with medical history significant for HTN, hyperlipidemia, remote CVA and history of an irregular heartbeat since his twenties. PCP referred patient to [facility] and he was seen by Dr. Gwenlyn Maxwell 12/25/15. Echocardiogram done 01/07/16 revealed ejection fraction of 99991111, grade 2 diastolic dysfunction. Patient was to follow up with cardiology for echo reports but in the interim developed worsening dyspnea. Over the last 2-3 days patient has become orthopneic with wheezing. He has been sleeping in a recliner. He reported left upper extremity discomfort today. Patient states he actually gets discomfort in his left upper extremity when physically exerting himself. The discomfort is mainly from the shoulder to the elbow and resolves with rest (worrisome for Angina equivalent).  Past Medical History  Diagnosis Date  . Frequent unifocal PVCs     Palpitations since early 20s, PVCs and pairs  . Stroke [facility]) 2009  . Hypertension   . Hyperlipidemia     Social History   Social History  . Marital Status: Married    Spouse Name: N/A  . Number of Children: N/A  . Years of Education: N/A   Social History Main Topics  . Smoking status: Never Smoker   . Smokeless tobacco: Never Used  . Alcohol Use: No  . Drug Use: No  . Sexual Activity: Not Currently   Other Topics Concern  . None   Social History Narrative    ECHO:Study Conclusions--01/07/16  - Left ventricle: The cavity size was normal. Wall thickness was  increased in a pattern of mild LVH. Systolic function was  moderately to severely reduced. The estimated ejection fraction  was in the range of 30% to 35%. Diffuse hypokinesis. Features are  consistent with a pseudonormal left ventricular filling pattern,  with concomitant abnormal relaxation and increased filling  pressure (grade 2 diastolic dysfunction). - Aortic valve: There was no stenosis. There  was mild  regurgitation. - Mitral valve: Mildly calcified annulus. There was mild  regurgitation. - Left atrium: The atrium was mildly dilated. - Right ventricle: Poorly visualized. The cavity size was mildly  dilated. Systolic function was mildly reduced. - Tricuspid valve: Peak RV-RA gradient (S): 20 mm Hg. - Pulmonary arteries: PA peak pressure: 23 mm Hg (S). - Inferior vena cava: The vessel was normal in size. The  respirophasic diameter changes were in the normal range (= 50%),  consistent with normal central venous pressure.  Impressions:  - Technically difficult study with poor acoustic windows. Echo  contrast likely would have helped but was not used. Normal LV  size with mild LV hypertrophy. EF 30-35%, diffuse hypokinesis.  Mildly dilated RV with mildly decreased systolic function. Mild  mitral regurgitation.  Transthoracic echocardiography. M-mode, complete 2D, spectral Doppler, and color Doppler. Birthdate: Patient birthdate: January 11, 1931. Age: Patient is 82 yr old. Sex: Gender: male. BMI: 31.4 kg/m^2. Blood pressure: 150/88 Patient status: Outpatient. Study date: Study date: 01/07/2016. Study time: 11:12 AM.  BNP    Component Value Date/Time   BNP 1049.6* 01/21/2016 1007    ProBNP No results Maxwell for: PROBNP   Education Assessment and Provision:  Detailed education and instructions provided on heart failure disease management including the following:  Signs and symptoms of Heart Failure When to call the physician Importance of daily weights Low sodium diet Fluid restriction Medication management Anticipated future follow-up appointments  Patient education given on each of the above topics.  Patient and wife acknowledge and can teach back understanding and  acceptance of all instructions.  I spoke briefly with Patrick Maxwell regarding his new HF during his ReDs vest reading.  He had a cardiac catheterization and intervention with 2  stents.  We reviewed the importance of daily weights and when to contact the physician with worsening symptoms of HF.  I also reinforced a low sodium diet and Na restriction to 2000 mg or less per day.  He denies any issues getting or taking prescribed medications.  He will follow at [facility] after discharge.  I encourgaed them to call with any concerns or questions related to HF.   Education Materials:  "Living Better With Heart Failure" Booklet, Daily Weight Tracker Tool   High Risk Criteria for Readmission and/or Poor Patient Outcomes:   EF <30%- 30-35%  2 or more admissions in 6 months- No  Difficult social situation- No  Demonstrates medication noncompliance- No   Barriers of Care:  Knowledge and compliance  Discharge Planning:   Plans to discharge to home with wife in [place].

## 2016-01-24 NOTE — Progress Notes (Signed)
ReDS Vest Discharge Study  Results of ReDS reading  Your patient is in the Unblinded arm of the Vest at Discharge study.  The ReDS reading is: 35  ( < 39)   Your patient is ok for discharge.  Thank You   The research team

## 2016-01-24 NOTE — Care Management Note (Signed)
Case Management Note  Patient Details  Name: Patrick Maxwell MRN: [id number] Date of Birth: Feb 27, 1931  Subjective/Objective:     Patient is s/p stent intervention, will be on plavix, ambulated 600 ft with cardiac rehab indep.  Patient has PCP and insurance.  Patient is for dc today.                  Action/Plan:   Expected Discharge Date:                  Expected Discharge Plan:  Home/Self Care  In-House Referral:     Discharge planning Services  CM Consult  Post Acute Care Choice:    Choice offered to:     DME Arranged:    DME Agency:     HH Arranged:    [place] Agency:     Status of Service:  Completed, signed off  Medicare Important Message Given:    Date Medicare IM Given:    Medicare IM give by:    Date Additional Medicare IM Given:    Additional Medicare Important Message give by:     If discussed at [place] of Stay Meetings, dates discussed:    Additional Comments:  Zenon Mayo, RN 01/24/2016, 11:58 AM

## 2016-01-24 NOTE — Discharge Summary (Signed)
Physician Discharge Summary  Patient ID: Patrick Maxwell MRN: [id number] DOB/AGE: Mar 04, 1931 82 y.o.  Admit date: 01/21/2016 Discharge date: 01/24/2016  Admission Diagnoses:  Discharge Diagnoses:  Principal Problem:   Acute combined systolic and diastolic congestive heart failure ([place]) Active Problems:   History of CVA (cerebrovascular accident) without residual deficits   Hypercholesteremia   Subclinical hypothyroidism   Palpitations   AKI (acute kidney injury) ([place])   Hypertensive heart disease   PVC's (premature ventricular contractions)   Unstable angina (HCC)   Coronary artery disease involving native coronary artery of native heart with unstable angina pectoris ([place])   Discharged Condition: stable  Hospital Course: 82 y.o. male with medical history significant for HTN, hyperlipidemia, remote CVA and history of an irregular heartbeat since his twenties. PCP referred patient to [facility] and he was seen by Dr. Gwenlyn Found 12/25/15. Echocardiogram done 01/07/16 revealed ejection fraction of 99991111, grade 2 diastolic dysfunction. Patient was to follow up with cardiology for echo reports but in the interim developed worsening dyspnea, orthopnea  with wheezing. Patient was been sleeping in a recliner. He reported left upper extremity discomfort prior to admission. Patient stated he experienced discomfort in his left upper extremity when physically exerting himself, and the arm pain resolved with rest (worrisome for Angina equivalent). Patient was admitted and managed for CHF exacerbation. Cardiology was consulted due to left arm pain and ischemic cardiomyopathy. Patient proceeded with cardiac catheterization that revealed multiple lesions, with 2 significant lesions (LAD and OM) that were stented. Patient is symptom free and has been eager to be discharged back home. There will be need to monitor renal function post cardiac catheterization as patient is at risk for contrast induced nephropathy. The  patient has been advised to re-check BMP (Renal function) at the PCP's office in am (in the presence of patient's wife and daughter in law) and they all voiced understanding of the instruction. Patient will need to be on aspirin and plavix for at least 12 months. Patient is to follow up with PCP and Cardiology within one week of discharge. Patient will need repeat ECHO in 3 months to re-assess EF. Cardiology team has cleared patient for discharge.   Consults: Cardiology  Significant Diagnostic Studies: Cardiac cath and PCI  Discharge medication - Please see Med. Rec. Treatments:   Discharge Exam: Blood pressure 154/59, pulse 80, temperature 97.9 F (36.6 C), temperature source Oral, resp. rate 17, height 5\' 9"  (1.753 m), weight 92.2 kg (203 lb 4.2 oz), SpO2 92 %.   Disposition: Home. Cardiac rehab  Discharge Instructions    Amb Referral to Cardiac Rehabilitation    Complete by:  As directed   Diagnosis:  Coronary Stents     Diet - low sodium heart healthy    Complete by:  As directed      Discharge instructions    Complete by:  As directed   Call MD with worsening symptoms.     Increase activity slowly    Complete by:  As directed             Medication List    STOP taking these medications        atenolol 25 MG tablet  Commonly known as:  TENORMIN     CO Q 10 PO     OVER THE COUNTER MEDICATION     Potassium 99 MG Tabs      TAKE these medications        aspirin 81 MG chewable tablet  Chew 1 tablet (  81 mg total) by mouth daily.     atorvastatin 80 MG tablet  Commonly known as:  LIPITOR  Take 1 tablet (80 mg total) by mouth daily at 6 PM.     carvedilol 6.25 MG tablet  Commonly known as:  COREG  Take 1 tablet (6.25 mg total) by mouth 2 (two) times daily with a meal.     clopidogrel 75 MG tablet  Commonly known as:  PLAVIX  Take 1 tablet (75 mg total) by mouth daily with breakfast.     furosemide 20 MG tablet  Commonly known as:  LASIX  Take 1 tablet (20 mg  total) by mouth daily. X 3 day only     lisinopril 2.5 MG tablet  Commonly known as:  PRINIVIL,ZESTRIL  Take 1 tablet (2.5 mg total) by mouth daily.     Magnesium 100 MG Tabs  Take 1 tablet by mouth 2 (two) times daily.     potassium chloride SA 20 MEQ tablet  Commonly known as:  K-DUR,KLOR-CON  Take 1 tablet (20 mEq total) by mouth daily.     VITAMIN C PO  Take by mouth.     vitamin E 400 UNIT capsule  Take 400 Units by mouth 3 (three) times a week.           Follow-up Information    Follow up with [facility] B[facility], PA-C In 2 days.   Specialty:  Physician Assistant   Why:  Post hospitalization. Check BMP (Renal function) in PCP's office in the morning. Follow up with the Cardiologit within 1-2 weeks   Contact information:   [place] [address] [phone]       Signed: Bonnell Public 01/24/2016, 11:00 AM

## 2016-01-25 ENCOUNTER — Ambulatory Visit (INDEPENDENT_AMBULATORY_CARE_PROVIDER_SITE_OTHER): Payer: Medicare Other | Admitting: Family Medicine

## 2016-01-25 ENCOUNTER — Encounter: Payer: Self-pay | Admitting: Family Medicine

## 2016-01-25 VITALS — BP 136/68 | HR 46 | Temp 97.9°F | Resp 12 | Ht 69.0 in | Wt 210.0 lb

## 2016-01-25 DIAGNOSIS — I10 Essential (primary) hypertension: Secondary | ICD-10-CM

## 2016-01-25 DIAGNOSIS — I25110 Atherosclerotic heart disease of native coronary artery with unstable angina pectoris: Secondary | ICD-10-CM | POA: Diagnosis not present

## 2016-01-25 DIAGNOSIS — I2510 Atherosclerotic heart disease of native coronary artery without angina pectoris: Secondary | ICD-10-CM | POA: Diagnosis not present

## 2016-01-25 DIAGNOSIS — I5043 Acute on chronic combined systolic (congestive) and diastolic (congestive) heart failure: Secondary | ICD-10-CM | POA: Diagnosis not present

## 2016-01-25 DIAGNOSIS — I509 Heart failure, unspecified: Secondary | ICD-10-CM | POA: Diagnosis not present

## 2016-01-25 DIAGNOSIS — N179 Acute kidney failure, unspecified: Secondary | ICD-10-CM

## 2016-01-25 NOTE — Assessment & Plan Note (Signed)
He is still compensated with regards to his CHF. He feels very well he's not fluid overloaded. He is still on Lasix 20 mg. I will check his renal function today she recently had contrast he likely has chronic kidney disease at his age and with his comorbidities.

## 2016-01-25 NOTE — Progress Notes (Signed)
Patient ID: Patrick Maxwell, male   DOB: August 01, 1931, 82 y.o.   MRN: [id number]    Subjective:    Patient ID: Patrick Maxwell, male    DOB: [birth year], 82 y.o.   MRN: [id number]  Patient presents for Hospital F/U and Labs        Patient here for hospital follow-up he was admitted to the hospital May 1-4th secondary to acute on chronic CHF. He also has history of hypertensive heart disease PVCs . His echocardiogram showed an ejection fraction of 30-35% along with diastolic dysfunction. He had cardiac catheterization done during the admission which revealed multiple obstructive lesions 20 which were stented. His chest pain and shortness of breath resolved prior to discharge. He is at high risk for contrast-induced nephropathy therefore wanting this quick visit to recheck his metabolic panel. He is on aspirin and Plavix for the next 12 months and he has follow with cardiology R reestablish for next week. On review his medications atenolol was discontinued as well as some potassium tablets he is now on carvedilol as his beta blocker he is also on Lasix 20 mg daily lisinopril 2.5 mg daily.   Review Of Systems:  GEN- denies fatigue, fever, weight loss,weakness, recent illness HEENT- denies eye drainage, change in vision, nasal discharge, CVS- denies chest pain, palpitations RESP- denies SOB, cough, wheeze ABD- denies N/V, change in stools, abd pain GU- denies dysuria, hematuria, dribbling, incontinence MSK- denies joint pain, muscle aches, injury Neuro- denies headache, dizziness, syncope, seizure activity       Objective:    BP 136/68 mmHg  Pulse 46  Temp(Src) 97.9 F (36.6 C) (Oral)  Resp 12  Ht 5\' 9"  (1.753 m)  Wt 210 lb (95.255 kg)  BMI 31.00 kg/m2  SpO2 96% GEN- NAD, alert and oriented x3 HEENT- PERRL, EOMI, non injected sclera, pink conjunctiva, MMM, oropharynx clear Neck- Supple, no JVD  CVS- RR, ? PVC ,  no murmur RESP-CTAB ABD-NABS,soft,NT,ND EXT- No edema Pulses- Radial   2+        Assessment & Plan:      Problem List Items Addressed This Visit    Hypertension, benign    Blood pressure is controlled he is doing well with the carvedilol      Coronary artery disease involving native coronary artery of native heart with unstable angina pectoris (HCC)   Relevant Orders   COMPLETE METABOLIC PANEL WITH GFR   CHF (congestive heart failure) ([place]) - Primary    He is still compensated with regards to his CHF. He feels very well he's not fluid overloaded. He is still on Lasix 20 mg. I will check his renal function today she recently had contrast he likely has chronic kidney disease at his age and with his comorbidities.      Relevant Orders   COMPLETE METABOLIC PANEL WITH GFR   CBC with Differential/Platelet   AKI (acute kidney injury) ([place])   Relevant Orders   COMPLETE METABOLIC PANEL WITH GFR   CBC with Differential/Platelet      Note: This dictation was prepared with Dragon dictation along with smaller phrase technology. Any transcriptional errors that result from this process are unintentional.

## 2016-01-25 NOTE — Assessment & Plan Note (Signed)
Blood pressure is controlled he is doing well with the carvedilol

## 2016-01-26 LAB — CBC WITH DIFFERENTIAL/PLATELET
Basophils Absolute: 64 cells/uL (ref 0–200)
Basophils Relative: 1 %
EOS PCT: 3 %
Eosinophils Absolute: 192 cells/uL (ref 15–500)
HCT: 47.1 % (ref 38.5–50.0)
HEMOGLOBIN: 15.6 g/dL (ref 13.0–17.0)
LYMPHS PCT: 26 %
Lymphs Abs: 1664 cells/uL (ref 850–3900)
MCH: 29 pg (ref 27.0–33.0)
MCHC: 33.1 g/dL (ref 32.0–36.0)
MCV: 87.5 fL (ref 80.0–100.0)
MPV: 12 fL (ref 7.5–12.5)
Monocytes Absolute: 768 cells/uL (ref 200–950)
Monocytes Relative: 12 %
NEUTROS PCT: 58 %
Neutro Abs: 3712 cells/uL (ref 1500–7800)
PLATELETS: 170 10*3/uL (ref 140–400)
RBC: 5.38 MIL/uL (ref 4.20–5.80)
RDW: 14.7 % (ref 11.0–15.0)
WBC: 6.4 10*3/uL (ref 3.8–10.8)

## 2016-01-26 LAB — COMPLETE METABOLIC PANEL WITH GFR
ALBUMIN: 4.4 g/dL (ref 3.6–5.1)
ALK PHOS: 69 U/L (ref 40–115)
ALT: 20 U/L (ref 9–46)
AST: 21 U/L (ref 10–35)
BUN: 31 mg/dL — AB (ref 7–25)
CALCIUM: 9.1 mg/dL (ref 8.6–10.3)
CO2: 25 mmol/L (ref 20–31)
CREATININE: 1.34 mg/dL — AB (ref 0.70–1.11)
Chloride: 103 mmol/L (ref 98–110)
GFR, Est African American: 57 mL/min — ABNORMAL LOW (ref >=60)
GFR, Est Non African American: 49 mL/min — ABNORMAL LOW (ref >=60)
Glucose, Bld: 96 mg/dL (ref 70–99)
POTASSIUM: 4.9 mmol/L (ref 3.5–5.3)
Sodium: 136 mmol/L (ref 135–146)
Total Bilirubin: 0.8 mg/dL (ref 0.2–1.2)
Total Protein: 6.9 g/dL (ref 6.1–8.1)

## 2016-01-28 ENCOUNTER — Other Ambulatory Visit: Payer: Self-pay | Admitting: *Deleted

## 2016-01-28 DIAGNOSIS — N189 Chronic kidney disease, unspecified: Secondary | ICD-10-CM

## 2016-01-30 ENCOUNTER — Telehealth (HOSPITAL_COMMUNITY): Payer: Self-pay | Admitting: Cardiac Rehabilitation

## 2016-01-30 NOTE — Telephone Encounter (Signed)
pc received from pt wife stating he is not interested in participating in cardiac rehab.

## 2016-02-08 ENCOUNTER — Encounter: Payer: Self-pay | Admitting: Cardiovascular Disease

## 2016-02-08 ENCOUNTER — Ambulatory Visit (INDEPENDENT_AMBULATORY_CARE_PROVIDER_SITE_OTHER): Payer: Medicare Other | Admitting: Cardiovascular Disease

## 2016-02-08 VITALS — BP 128/88 | HR 82 | Ht 69.0 in | Wt 207.4 lb

## 2016-02-08 DIAGNOSIS — I10 Essential (primary) hypertension: Secondary | ICD-10-CM

## 2016-02-08 DIAGNOSIS — Z79899 Other long term (current) drug therapy: Secondary | ICD-10-CM

## 2016-02-08 DIAGNOSIS — E785 Hyperlipidemia, unspecified: Secondary | ICD-10-CM

## 2016-02-08 DIAGNOSIS — I5041 Acute combined systolic (congestive) and diastolic (congestive) heart failure: Secondary | ICD-10-CM

## 2016-02-08 DIAGNOSIS — I25110 Atherosclerotic heart disease of native coronary artery with unstable angina pectoris: Secondary | ICD-10-CM | POA: Diagnosis not present

## 2016-02-08 NOTE — Assessment & Plan Note (Signed)
History of hypertension with blood pressure measured today at 128/88. He is on carvedilol and lisinopril. Continue current meds at current dosing

## 2016-02-08 NOTE — Patient Instructions (Addendum)
Medication Instructions: Your physician recommends that you continue on your current medications as directed. Please refer to the Current Medication list given to you today.  Labwork: Your physician recommends that you return for lab work in: 2 months - FASTING  Testing/Procedures: 1. Echocardiogram in 3 months - Your physician has requested that you have an echocardiogram. Echocardiography is a painless test that uses sound waves to create images of your heart. It provides your doctor with information about the size and shape of your heart and how well your heart's chambers and valves are working. This procedure takes approximately one hour. There are no restrictions for this procedure.  Follow-up: Dr Gwenlyn Found recommends that you schedule a follow-up appointment in 6 months. You will receive a reminder letter in the mail two months in advance. If you don't receive a letter, please call our office to schedule the follow-up appointment.  If you need a refill on your cardiac medications before your next appointment, please call your pharmacy.

## 2016-02-08 NOTE — Progress Notes (Signed)
02/08/2016 Patrick Maxwell   11-01-1930  [id number]  Primary Physician Karis Juba, PA-C Primary Cardiologist: Lorretta Harp MD Renae Gloss   HPI:  Mr. Nick is an 82 year old mildly overweight married Caucasian male who is accompanied by his wife Mariann Laster. He has 9 children and 30 grandchildren. He is a retired Administrator. He was referred by Karis Juba for cardiovascular evaluation and to be established in our practice. I last saw him in the office 12/25/15.He has no prior cardiac history. He has had a stroke in November 2009 without residual. He has a negative cath at [facility] December 1999. His risk factors include hypertension and hyperlipidemia. He does have palpitations with PVCs on beta-blockade and was complaining of dyspnea as well. A 2-D echocardiogram revealed moderate to severe LV dysfunction with an ejection fraction of 30-35% with grade 2 diastolic dysfunction. I had planned to see him back in the office on 01/24/16 to discuss this and further investigate however he was admitted to [facility] on 01/21/16 with left arm pain and shortness of breath. He underwent cardiac catheterization on 01/23/16 by Dr. Angelena Form revealing high-grade LAD and circumflex obtuse marginal branch disease. He had PCI and drug-eluting stenting using Promus drug-eluting stents the femoral approach. He was begun on high-dose statin therapy. Since that time his symptoms have markedly improved.   Current Outpatient Prescriptions  Medication Sig Dispense Refill  . Ascorbic Acid (VITAMIN C PO) Take by mouth.    Marland Kitchen aspirin 81 MG chewable tablet Chew 1 tablet (81 mg total) by mouth daily. 30 tablet 0  . atorvastatin (LIPITOR) 80 MG tablet Take 1 tablet (80 mg total) by mouth daily at 6 PM. 30 tablet 0  . carvedilol (COREG) 6.25 MG tablet Take 1 tablet (6.25 mg total) by mouth 2 (two) times daily with a meal. 60 tablet 0  . clopidogrel (PLAVIX) 75 MG tablet Take 1 tablet (75 mg  total) by mouth daily with breakfast. 30 tablet 0  . Coenzyme Q10 (COQ10) 100 MG CAPS Take 1 capsule by mouth daily.    . furosemide (LASIX) 20 MG tablet Take 1 tablet (20 mg total) by mouth daily. 90 tablet 1  . lisinopril (PRINIVIL,ZESTRIL) 2.5 MG tablet Take 1 tablet (2.5 mg total) by mouth daily. 30 tablet 0  . Magnesium 100 MG TABS Take 1 tablet by mouth 2 (two) times daily.    . potassium chloride SA (K-DUR,KLOR-CON) 20 MEQ tablet Take 1 tablet (20 mEq total) by mouth daily. 30 tablet 0  . vitamin E 400 UNIT capsule Take 400 Units by mouth 3 (three) times a week.     No current facility-administered medications for this visit.    No Known Allergies  Social History   Social History  . Marital Status: Married    Spouse Name: N/A  . Number of Children: N/A  . Years of Education: N/A   Occupational History  . Not on file.   Social History Main Topics  . Smoking status: Never Smoker   . Smokeless tobacco: Never Used  . Alcohol Use: No  . Drug Use: No  . Sexual Activity: Not Currently   Other Topics Concern  . Not on file   Social History Narrative     Review of Systems: General: negative for chills, fever, night sweats or weight changes.  Cardiovascular: negative for chest pain, dyspnea on exertion, edema, orthopnea, palpitations, paroxysmal nocturnal dyspnea or shortness of breath Dermatological: negative for rash Respiratory:  negative for cough or wheezing Urologic: negative for hematuria Abdominal: negative for nausea, vomiting, diarrhea, bright red blood per rectum, melena, or hematemesis Neurologic: negative for visual changes, syncope, or dizziness All other systems reviewed and are otherwise negative except as noted above.    Blood pressure 128/88, pulse 82, height 5\' 9"  (1.753 m), weight 207 lb 6.4 oz (94.076 kg), SpO2 96 %.  General appearance: alert and no distress Neck: no adenopathy, no carotid bruit, no JVD, supple, symmetrical, trachea midline and  thyroid not enlarged, symmetric, no tenderness/mass/nodules Lungs: clear to auscultation bilaterally Heart: regular rate and rhythm, S1, S2 normal, no murmur, click, rub or gallop Extremities: extremities normal, atraumatic, no cyanosis or edema  EKG not performed today  ASSESSMENT AND PLAN:   Hypertension, benign History of hypertension with blood pressure measured today at 128/88. He is on carvedilol and lisinopril. Continue current meds at current dosing  Hypercholesteremia History of hyperlipidemia on red yeast rice in the past. He was recently admitted with chest pain and had intervention. His lipid profile revealed total cholesterol 166, LDL 116 and HDL 35. He was begun on atorvastatin 80 mg a day. We will we'll recheck a lipid and liver profile in 2 months.  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 His Symptoms Have Improved. He Is on Appropriate Medications. I Will Recheck a 2-D Echo in Several Months to Assess for Return of LV Function.  Coronary artery disease involving native coronary artery of native heart with unstable angina pectoris ([place]) History of CAD status post recent intervention by Dr.McAlany on 01/23/16 with stenting of his proximal LAD and circumflex obtuse marginal branch with Promus stents. Since his intervention his symptoms have significantly improved.He is on dual antiplatelet  therapy including aspirin and Plavix.      Lorretta Harp MD FACP,FACC,FAHA, [facility] 02/08/2016 8:48 AM

## 2016-02-08 NOTE — Assessment & Plan Note (Signed)
History of combined systolic and diastolic heart failure with an ejection fraction in the 30-35% range by 2-D echo with grade 2 diastolic dysfunction and increased filling pressures.He was found additionally to have Severe 2 Vessel Disease at cardiac catheterization and Underwent Intervention. His Symptoms Have Improved. He Is on Appropriate Medications. I Will Recheck a 2-D Echo in Several Months to Assess for Return of LV Function.

## 2016-02-08 NOTE — Assessment & Plan Note (Signed)
History of hyperlipidemia on red yeast rice in the past. He was recently admitted with chest pain and had intervention. His lipid profile revealed total cholesterol 166, LDL 116 and HDL 35. He was begun on atorvastatin 80 mg a day. We will we'll recheck a lipid and liver profile in 2 months.

## 2016-02-08 NOTE — Assessment & Plan Note (Signed)
History of CAD status post recent intervention by Dr.McAlany on 01/23/16 with stenting of his proximal LAD and circumflex obtuse marginal branch with Promus stents. Since his intervention his symptoms have significantly improved.He is on dual antiplatelet  therapy including aspirin and Plavix.

## 2016-02-11 ENCOUNTER — Other Ambulatory Visit: Payer: Medicare Other

## 2016-02-19 ENCOUNTER — Telehealth: Payer: Self-pay | Admitting: Physician Assistant

## 2016-02-19 ENCOUNTER — Ambulatory Visit (INDEPENDENT_AMBULATORY_CARE_PROVIDER_SITE_OTHER): Payer: Medicare Other | Admitting: Family Medicine

## 2016-02-19 ENCOUNTER — Encounter: Payer: Self-pay | Admitting: Family Medicine

## 2016-02-19 VITALS — BP 138/64 | HR 60 | Temp 97.9°F | Resp 14 | Ht 69.0 in | Wt 204.0 lb

## 2016-02-19 DIAGNOSIS — I499 Cardiac arrhythmia, unspecified: Secondary | ICD-10-CM | POA: Diagnosis not present

## 2016-02-19 DIAGNOSIS — I493 Ventricular premature depolarization: Secondary | ICD-10-CM | POA: Diagnosis not present

## 2016-02-19 DIAGNOSIS — I959 Hypotension, unspecified: Secondary | ICD-10-CM

## 2016-02-19 DIAGNOSIS — N189 Chronic kidney disease, unspecified: Secondary | ICD-10-CM

## 2016-02-19 DIAGNOSIS — I25110 Atherosclerotic heart disease of native coronary artery with unstable angina pectoris: Secondary | ICD-10-CM

## 2016-02-19 NOTE — Progress Notes (Signed)
Patient ID: Patrick Maxwell, male   DOB: March 29, 1931, 82 y.o.   MRN: [id number]    Subjective:    Patient ID: Patrick Maxwell, male    DOB: 08/22/1931, 82 y.o.   MRN: [id number]  Patient presents for Hypotension Patient here with concern for low blood pressure. He's been checking his blood pressure up with self by the 6 times a day. He states in the mid afternoon is when his blood pressure starts to decrease his systolic low [id number] over 40 and he feels "bad". In general his blood pressure ranges 105 to 140s over 40s to 60s. He's also had some decrease in his heart rate most of his heart rates listed are 40s to low 50s occasionally he was up to 76. Per my previous note he was recently admitted diagnosed with coronary artery disease status post stent placement he is on plastics and aspirin he also had acute on chronic heart failure he's currently on Lasix 20 mg lisinopril 2.5 and his atenolol was discontinued and he was put on carvedilol 6.25 twice a day. He is concerned that he is on too much medication as well as his wife as they like to do things more natural pathic. He also does not like to bruising from the Plavix and wants to stop taking this despite his cardiologist telling him that his stent may collapse. He was last seen by cardiology 2 weeks ago I reviewed that note.    Review Of Systems:  GEN- denies fatigue, fever, weight loss,weakness, recent illness HEENT- denies eye drainage, change in vision, nasal discharge, CVS- denies chest pain, palpitations RESP- denies SOB, cough, wheeze ABD- denies N/V, change in stools, abd pain GU- denies dysuria, hematuria, dribbling, incontinence MSK- denies joint pain, muscle aches, injury Neuro- denies headache, dizziness, syncope, seizure activity       Objective:    BP 138/64 mmHg  Pulse 60  Temp(Src) 97.9 F (36.6 C) (Oral)  Resp 14  Ht 5\' 9"  (1.753 m)  Wt 204 lb (92.534 kg)  BMI 30.11 kg/m2 GEN- NAD, alert and oriented x3 HEENT- PERRL,  EOMI, non injected sclera, pink conjunctiva, MMM, oropharynx clear Neck- Supple, no thyromegaly CVS- irregular rhythem, normal rate , no murmur RESP-CTAB EXT- No edema Pulses- Radial  2+        Assessment & Plan:      Problem List Items Addressed This Visit    PVC's (premature ventricular contractions)    EKG shows multiple PVC which are not new       Other Visit Diagnoses    Hypotension, unspecified hypotension type    -  Primary    BP dropping and pt symptomatic, discussed importance of his medications with new diagnosis CHF and CAD. Will decrease coreg to 3.125mg  BID see if he tolerates this better I did discuss not checking BP every 1-2 hours , no need for this    Relevant Orders    EKG 12-Lead (Completed)    Irregular heart rhythm        Relevant Orders    EKG 12-Lead (Completed)    CKD (chronic kidney disease), unspecified stage        Recheck renal function       Note: This dictation was prepared with Dragon dictation along with smaller phrase technology. Any transcriptional errors that result from this process are unintentional.

## 2016-02-19 NOTE — Patient Instructions (Addendum)
Decrease Coreg to to 1/2 tablet twice a day 3.125mg  BID Check blood pressure twice a day and only if you feel sick/tired/dizzy  If your symptoms return on the lower dose please schedule with your cardiologist

## 2016-02-19 NOTE — Telephone Encounter (Signed)
Heart cath May 4.  Has seen [place] and Dr Gwenlyn Found.  Wife states since then daily around lunch time his BP drops and he feels awful.  Given appt for later today

## 2016-02-19 NOTE — Telephone Encounter (Signed)
Patients wife calling to say that he is having issues with his blood pressure being too low would like a call back asap if possible  [phone]

## 2016-02-20 ENCOUNTER — Encounter: Payer: Self-pay | Admitting: Family Medicine

## 2016-02-20 LAB — BASIC METABOLIC PANEL
BUN: 26 mg/dL — AB (ref 7–25)
CO2: 21 mmol/L (ref 20–31)
Calcium: 9.1 mg/dL (ref 8.6–10.3)
Chloride: 106 mmol/L (ref 98–110)
Creat: 1.13 mg/dL — ABNORMAL HIGH (ref 0.70–1.11)
Glucose, Bld: 102 mg/dL — ABNORMAL HIGH (ref 70–99)
POTASSIUM: 4.4 mmol/L (ref 3.5–5.3)
Sodium: 141 mmol/L (ref 135–146)

## 2016-02-20 MED ORDER — LISINOPRIL 2.5 MG PO TABS: 2.5000 mg | ORAL_TABLET | Freq: Every day | ORAL | Status: DC

## 2016-02-20 MED ORDER — POTASSIUM CHLORIDE CRYS ER 20 MEQ PO TBCR: 20.0000 meq | EXTENDED_RELEASE_TABLET | Freq: Every day | ORAL | Status: DC

## 2016-02-20 MED ORDER — CLOPIDOGREL BISULFATE 75 MG PO TABS: 75.0000 mg | ORAL_TABLET | Freq: Every day | ORAL | Status: DC

## 2016-02-20 MED ORDER — FUROSEMIDE 20 MG PO TABS: 20.0000 mg | ORAL_TABLET | Freq: Every day | ORAL | Status: DC

## 2016-02-20 MED ORDER — ATORVASTATIN CALCIUM 80 MG PO TABS: 80.0000 mg | ORAL_TABLET | Freq: Every day | ORAL | Status: DC

## 2016-02-20 NOTE — Assessment & Plan Note (Signed)
EKG shows multiple PVC which are not new

## 2016-02-22 ENCOUNTER — Other Ambulatory Visit: Payer: Self-pay | Admitting: Family Medicine

## 2016-02-22 MED ORDER — ATORVASTATIN CALCIUM 80 MG PO TABS: 80.0000 mg | ORAL_TABLET | Freq: Every day | ORAL | Status: DC

## 2016-02-22 MED ORDER — FUROSEMIDE 20 MG PO TABS: 20.0000 mg | ORAL_TABLET | Freq: Every day | ORAL | Status: DC

## 2016-02-22 MED ORDER — LISINOPRIL 2.5 MG PO TABS: 2.5000 mg | ORAL_TABLET | Freq: Every day | ORAL | Status: DC

## 2016-02-22 MED ORDER — ASPIRIN 81 MG PO CHEW: 81.0000 mg | CHEWABLE_TABLET | Freq: Every day | ORAL | Status: DC

## 2016-02-22 MED ORDER — POTASSIUM CHLORIDE CRYS ER 20 MEQ PO TBCR: 20.0000 meq | EXTENDED_RELEASE_TABLET | Freq: Every day | ORAL | Status: DC

## 2016-02-22 MED ORDER — CLOPIDOGREL BISULFATE 75 MG PO TABS
75.0000 mg | ORAL_TABLET | Freq: Every day | ORAL | Status: DC
Start: 2016-02-22 — End: 2017-01-06

## 2016-02-22 MED ORDER — CARVEDILOL 6.25 MG PO TABS
6.2500 mg | ORAL_TABLET | Freq: Two times a day (BID) | ORAL | Status: DC
Start: 2016-02-22 — End: 2016-03-04

## 2016-02-22 NOTE — Telephone Encounter (Signed)
All refills resent to [facility]

## 2016-03-04 ENCOUNTER — Telehealth: Payer: Self-pay | Admitting: Cardiology

## 2016-03-04 ENCOUNTER — Telehealth: Payer: Self-pay | Admitting: Cardiovascular Disease

## 2016-03-04 NOTE — Telephone Encounter (Signed)
Returned call to patient Patient states if he takes a whole pill of lisinopril (?) BP runs "low" 118, 0000000, 0000000, 98 (systolic) -- patient states when BP gets real low he has vision trouble (sees halos) and gets "foggy" Patient states his HR is consistently in 40s - for past 2-3 weeks Patient denies chest pain, shortness of breath  Wife states patient is following his diet very well - watching salt intake, staying away from processed meats Maintaining weight of 202lbs - weighs daily  Patient saw PCP (note in EPIC) - PCP decrease coreg dose from 6.25mg  BID to 3.125mg  BID (updated in med list)  Informed patient/wife that I will defer message to Dr. Gwenlyn Found & clinical pharmacy staff

## 2016-03-04 NOTE — Telephone Encounter (Signed)
Mr.  Crute is calling about his medications not being right , thinks he needs a adjustment to his medication. Blood pressure is all over the place and pulse is too low . Please call   Thanks

## 2016-03-04 NOTE — Telephone Encounter (Signed)
Have him d/c lisinopril for now (it's only 2.5 mg) and have him check BP at home twice daily (no more than that)  Schedule appt for follow up with [facility] for 3-4 weeks and bring in his list of home BP readings

## 2016-03-04 NOTE — Telephone Encounter (Signed)
Called patient and communicated recommendations per Erasmo Downer, PharmD. Patient and wife voiced understanding and wrote down instructions. Informed patient I will have a scheduler contact him to arrange BP check.

## 2016-03-06 NOTE — Telephone Encounter (Signed)
Closed encounter °

## 2016-03-27 ENCOUNTER — Encounter: Payer: Self-pay | Admitting: Pharmacist Clinician (PhC)/ Clinical Pharmacy Specialist

## 2016-03-27 ENCOUNTER — Ambulatory Visit (INDEPENDENT_AMBULATORY_CARE_PROVIDER_SITE_OTHER): Payer: Medicare Other | Admitting: Pharmacist Clinician (PhC)/ Clinical Pharmacy Specialist

## 2016-03-27 ENCOUNTER — Ambulatory Visit: Payer: Medicare Other

## 2016-03-27 VITALS — BP 158/94 | HR 96 | Ht 69.0 in | Wt 207.4 lb

## 2016-03-27 DIAGNOSIS — I10 Essential (primary) hypertension: Secondary | ICD-10-CM

## 2016-03-27 DIAGNOSIS — I25110 Atherosclerotic heart disease of native coronary artery with unstable angina pectoris: Secondary | ICD-10-CM | POA: Diagnosis not present

## 2016-03-27 NOTE — Patient Instructions (Addendum)
   Your blood pressure today is 158/94  (goal is < 150/90)  Check your blood pressure at home several times each week and keep record of the readings.  Take your BP meds as follows: continue with all current medications  Bring all of your meds, your BP cuff and your record of home blood pressures to your next appointment.  Exercise as you're able, try to walk approximately 30 minutes per day.  Keep salt intake to a minimum, especially watch canned and prepared boxed foods.  Eat more fresh fruits and vegetables and fewer canned items.  Avoid eating in fast food restaurants.    HOW TO TAKE YOUR BLOOD PRESSURE: . Rest 5 minutes before taking your blood pressure. .  Don't smoke or drink caffeinated beverages for at least 30 minutes before. . Take your blood pressure before (not after) you eat. . Sit comfortably with your back supported and both feet on the floor (don't cross your legs). . Elevate your arm to heart level on a table or a desk. . Use the proper sized cuff. It should fit smoothly and snugly around your bare upper arm. There should be enough room to slip a fingertip under the cuff. The bottom edge of the cuff should be 1 inch above the crease of the elbow. . Ideally, take 3 measurements at one sitting and record the average.

## 2016-03-27 NOTE — Progress Notes (Signed)
03/27/2016 Patrick Maxwell 1930/10/03 [id number]   HPI:  Patrick Maxwell is a 82 y.o. male patient of Dr Gwenlyn Found with a PMH below who presents today for hypertension clinic evaluation.  He was maintained for some time on lisinopril 2.5 mg and carvedilol 6.25 mg.  He called into the office aout 2 weeks ago, noting some drops in his BP associated with vision problems and feeling "foggy".  Systolic BP was down around 100.  It was recommended that he discontinue the lisinopril, however he reports that he instead cut to 1.25 mg daily.  Since then his home BP readings have all been essentially WNL, with nothing lower than [id number] systolic.   He saw his PCP on May 30 who noted that patient should decrease carvedilol to 3.125 mg bid, but this was not updated in the medication list.  Will do so today.   He had also been concerned about low heart rates, but the patient has noted bigeminy on his last EKG.  Blood Pressure Goal:  150/90   Current Medications:  Lisinopril 1.25 mg qd  Carvedilol 6.25 mg bid  Furosemide 20 mg qod  Cardiac Hx:  CHF (combined systolic and diastolic)  CAD (stenting x 2 01/2016)  Hyperlipidemia   Family Hx:  No significant family history of heart disease   Social Hx:  No tobacco, alcohol or caffeine (does drink decaf coffee)  Diet:  Eats mostly home cooked foods, only occasional boxed or canned.  Does admit to some added sodium  Exercise:  No formal exercise, does yard work, including mowing lawns, walks most days, about 2 miles  Home BP readings:  Over past 2 weeks average BP [id number] with systolic range of [id number].  All diastolic readings WNL  Intolerances:    None  Wt Readings from Last 3 Encounters:  03/27/16 207 lb 6.4 oz (94.076 kg)  02/19/16 204 lb (92.534 kg)  02/08/16 207 lb 6.4 oz (94.076 kg)   BP Readings from Last 3 Encounters:  03/27/16 158/94  02/19/16 138/64  02/08/16 128/88   Pulse Readings from Last 3 Encounters:  03/27/16 96  02/19/16  60  02/08/16 82    Current Outpatient Prescriptions  Medication Sig Dispense Refill  . lisinopril (PRINIVIL,ZESTRIL) 2.5 MG tablet Take 1.25 mg by mouth daily.    . Ascorbic Acid (VITAMIN C PO) Take by mouth.    Marland Kitchen aspirin 81 MG chewable tablet Chew 1 tablet (81 mg total) by mouth daily. 90 tablet 3  . atorvastatin (LIPITOR) 80 MG tablet Take 1 tablet (80 mg total) by mouth daily at 6 PM. 90 tablet 1  . carvedilol (COREG) 6.25 MG tablet Take 3.125 mg by mouth 2 (two) times daily with a meal.    . clopidogrel (PLAVIX) 75 MG tablet Take 1 tablet (75 mg total) by mouth daily with breakfast. 90 tablet 1  . Coenzyme Q10 (COQ10) 100 MG CAPS Take 1 capsule by mouth daily.    . furosemide (LASIX) 20 MG tablet Take 1 tablet (20 mg total) by mouth daily. 90 tablet 1  . Magnesium 100 MG TABS Take 1 tablet by mouth 2 (two) times daily.    . potassium chloride SA (K-DUR,KLOR-CON) 20 MEQ tablet Take 1 tablet (20 mEq total) by mouth daily. 90 tablet 1  . vitamin E 400 UNIT capsule Take 400 Units by mouth 3 (three) times a week.     No current facility-administered medications for this visit.    No Known Allergies  Past Medical History  Diagnosis Date  . Frequent unifocal PVCs     Palpitations since early 20s, PVCs and pairs  . Stroke [facility]) 2009  . Hypertension   . Hyperlipidemia   . CAD (coronary artery disease)   . CHF (congestive heart failure) (HCC)     Blood pressure 158/94, pulse 96, height 5\' 9"  (1.753 m), weight 207 lb 6.4 oz (94.076 kg).    Tommy Medal PharmD CPP [facility]

## 2016-03-27 NOTE — Assessment & Plan Note (Signed)
Patient showed elevated BP in the office today, as did his home cuff.  Office reading 158/94 and home cuff read at 163/112.  The systolic was within 10 points, although the diastolic was 18 points off.  I suspect there was some measure of white coat hypertension in the office.  Because all of his home readings look fine, and his sensitivity to hypotension, I am going to leave his medications unchanged at this time.  He will continue with regular home BP checks and knows to call the office should he have any concerns.  I explained that his pulse was probably in normal range, but with the bigeminy, his home cuff may not be picking up all the beats.

## 2016-04-25 ENCOUNTER — Telehealth: Payer: Self-pay | Admitting: *Deleted

## 2016-04-25 NOTE — Telephone Encounter (Signed)
I called patient for 34-month phone call follow-up for REDS@[email or web address]  Study. Patient returned my call back. Patient is doing well and has not been in the hospital in last 3 months. I thanked patient for his participation in the study.

## 2016-05-06 ENCOUNTER — Ambulatory Visit (HOSPITAL_COMMUNITY): Payer: Medicare Other | Attending: Cardiovascular Disease

## 2016-05-06 ENCOUNTER — Other Ambulatory Visit: Payer: Self-pay

## 2016-05-06 DIAGNOSIS — I110 Hypertensive heart disease with heart failure: Secondary | ICD-10-CM | POA: Diagnosis not present

## 2016-05-06 DIAGNOSIS — I509 Heart failure, unspecified: Secondary | ICD-10-CM | POA: Insufficient documentation

## 2016-05-06 DIAGNOSIS — E785 Hyperlipidemia, unspecified: Secondary | ICD-10-CM | POA: Insufficient documentation

## 2016-05-06 DIAGNOSIS — I25110 Atherosclerotic heart disease of native coronary artery with unstable angina pectoris: Secondary | ICD-10-CM | POA: Diagnosis not present

## 2016-05-06 DIAGNOSIS — I2510 Atherosclerotic heart disease of native coronary artery without angina pectoris: Secondary | ICD-10-CM | POA: Insufficient documentation

## 2016-05-06 DIAGNOSIS — R002 Palpitations: Secondary | ICD-10-CM | POA: Diagnosis not present

## 2016-05-06 DIAGNOSIS — I493 Ventricular premature depolarization: Secondary | ICD-10-CM | POA: Insufficient documentation

## 2016-05-06 DIAGNOSIS — I340 Nonrheumatic mitral (valve) insufficiency: Secondary | ICD-10-CM | POA: Diagnosis not present

## 2016-05-06 DIAGNOSIS — I351 Nonrheumatic aortic (valve) insufficiency: Secondary | ICD-10-CM | POA: Insufficient documentation

## 2016-07-11 ENCOUNTER — Telehealth: Payer: Self-pay | Admitting: Physician Assistant

## 2016-11-21 ENCOUNTER — Telehealth: Payer: Self-pay | Admitting: *Deleted

## 2016-11-21 NOTE — Telephone Encounter (Signed)
Received VM from patient wife Mariann Laster.   Requested to schedule appointment for patient.   Please contact her for more information.

## 2016-12-30 ENCOUNTER — Other Ambulatory Visit: Payer: Medicare Other

## 2016-12-30 DIAGNOSIS — E7800 Pure hypercholesterolemia, unspecified: Secondary | ICD-10-CM

## 2016-12-30 DIAGNOSIS — E039 Hypothyroidism, unspecified: Secondary | ICD-10-CM | POA: Diagnosis not present

## 2016-12-30 DIAGNOSIS — Z125 Encounter for screening for malignant neoplasm of prostate: Secondary | ICD-10-CM | POA: Diagnosis not present

## 2016-12-30 DIAGNOSIS — Z1321 Encounter for screening for nutritional disorder: Secondary | ICD-10-CM

## 2016-12-30 DIAGNOSIS — I10 Essential (primary) hypertension: Secondary | ICD-10-CM | POA: Diagnosis not present

## 2016-12-30 DIAGNOSIS — E038 Other specified hypothyroidism: Secondary | ICD-10-CM

## 2016-12-30 LAB — COMPLETE METABOLIC PANEL WITH GFR
ALBUMIN: 4.1 g/dL (ref 3.6–5.1)
ALK PHOS: 62 U/L (ref 40–115)
ALT: 17 U/L (ref 9–46)
AST: 19 U/L (ref 10–35)
BILIRUBIN TOTAL: 1 mg/dL (ref 0.2–1.2)
BUN: 21 mg/dL (ref 7–25)
CALCIUM: 8.6 mg/dL (ref 8.6–10.3)
CO2: 24 mmol/L (ref 20–31)
Chloride: 106 mmol/L (ref 98–110)
Creat: 1.3 mg/dL — ABNORMAL HIGH (ref 0.70–1.11)
GFR, EST NON AFRICAN AMERICAN: 50 mL/min — AB (ref >=60)
GFR, Est African American: 58 mL/min — ABNORMAL LOW (ref >=60)
GLUCOSE: 106 mg/dL — AB (ref 70–99)
Potassium: 4.7 mmol/L (ref 3.5–5.3)
SODIUM: 140 mmol/L (ref 135–146)
TOTAL PROTEIN: 6.3 g/dL (ref 6.1–8.1)

## 2016-12-30 LAB — CBC WITH DIFFERENTIAL/PLATELET
BASOS ABS: 0 {cells}/uL (ref 0–200)
Basophils Relative: 0 %
EOS ABS: 340 {cells}/uL (ref 15–500)
EOS PCT: 4 %
HEMATOCRIT: 44.9 % (ref 38.5–50.0)
HEMOGLOBIN: 15 g/dL (ref 13.0–17.0)
LYMPHS ABS: 1445 {cells}/uL (ref 850–3900)
Lymphocytes Relative: 17 %
MCH: 30.1 pg (ref 27.0–33.0)
MCHC: 33.4 g/dL (ref 32.0–36.0)
MCV: 90 fL (ref 80.0–100.0)
MONO ABS: 850 {cells}/uL (ref 200–950)
MPV: 11.3 fL (ref 7.5–12.5)
Monocytes Relative: 10 %
NEUTROS ABS: 5865 {cells}/uL (ref 1500–7800)
NEUTROS PCT: 69 %
Platelets: 135 10*3/uL — ABNORMAL LOW (ref 140–400)
RBC: 4.99 MIL/uL (ref 4.20–5.80)
RDW: 14.5 % (ref 11.0–15.0)
WBC: 8.5 10*3/uL (ref 3.8–10.8)

## 2016-12-30 LAB — TSH: TSH: 4.2 m[IU]/L (ref 0.40–4.50)

## 2016-12-30 LAB — LIPID PANEL
CHOLESTEROL: 151 mg/dL (ref <200)
HDL: 45 mg/dL (ref >40)
LDL Cholesterol: 86 mg/dL (ref <100)
Total CHOL/HDL Ratio: 3.4 Ratio (ref <5.0)
Triglycerides: 98 mg/dL (ref <150)
VLDL: 20 mg/dL (ref <30)

## 2016-12-30 LAB — PSA: PSA: 5.5 ng/mL — AB (ref <=4.0)

## 2016-12-31 LAB — VITAMIN D 25 HYDROXY (VIT D DEFICIENCY, FRACTURES): VIT D 25 HYDROXY: 28 ng/mL — AB (ref 30–100)

## 2017-01-06 ENCOUNTER — Encounter: Payer: Self-pay | Admitting: Family Medicine

## 2017-01-06 ENCOUNTER — Ambulatory Visit (INDEPENDENT_AMBULATORY_CARE_PROVIDER_SITE_OTHER): Payer: Medicare Other | Admitting: Family Medicine

## 2017-01-06 VITALS — BP 138/68 | HR 82 | Temp 98.1°F | Resp 14 | Ht 69.0 in | Wt 198.0 lb

## 2017-01-06 DIAGNOSIS — Z8673 Personal history of transient ischemic attack (TIA), and cerebral infarction without residual deficits: Secondary | ICD-10-CM

## 2017-01-06 DIAGNOSIS — N400 Enlarged prostate without lower urinary tract symptoms: Secondary | ICD-10-CM

## 2017-01-06 DIAGNOSIS — E7800 Pure hypercholesterolemia, unspecified: Secondary | ICD-10-CM | POA: Diagnosis not present

## 2017-01-06 DIAGNOSIS — I10 Essential (primary) hypertension: Secondary | ICD-10-CM

## 2017-01-06 DIAGNOSIS — I25110 Atherosclerotic heart disease of native coronary artery with unstable angina pectoris: Secondary | ICD-10-CM

## 2017-01-06 DIAGNOSIS — R972 Elevated prostate specific antigen [PSA]: Secondary | ICD-10-CM

## 2017-01-06 DIAGNOSIS — R94120 Abnormal auditory function study: Secondary | ICD-10-CM

## 2017-01-06 DIAGNOSIS — Z0000 Encounter for general adult medical examination without abnormal findings: Secondary | ICD-10-CM | POA: Diagnosis not present

## 2017-01-06 HISTORY — DX: Benign prostatic hyperplasia without lower urinary tract symptoms: N40.0

## 2017-01-06 NOTE — Progress Notes (Signed)
Subjective:   Patient presents for Medicare Annual/Subsequent preventive examination.   last seen in May 2017, at that time had some hypotension therefore Coreg was changed to 3.125mg  BID - he would like to stop some of cardiac medications feels he is doing well and does not want to be on meds  Medications reviewed  Reviewed fasting labs- had elevated PSA to 5.5  Decreased hearing from Right ear  He drinks a vitamin shake every morning and has changed his diet, has lost weight intentionally states he feels great  Had bruising beneath chin, states he fell off a ladder, was in soft ground and shifted  Review Past Medical/Family/Social: Per EMR    Risk Factors  Current exercise habits: Walks  Dietary issues discussed: Yes  Cardiac risk factors: CAD,Stroke, CHF   Depression Screen  (Note: if answer to either of the following is "Yes", a more complete depression screening is indicated)  Over the past two weeks, have you felt down, depressed or hopeless? No Over the past two weeks, have you felt little interest or pleasure in doing things? No Have you lost interest or pleasure in daily life? No Do you often feel hopeless? No Do you cry easily over simple problems? No   Activities of Daily Living  In your present state of health, do you have any difficulty performing the following activities?:  Driving? No  Managing money? No  Feeding yourself? No  Getting from bed to chair? No  Climbing a flight of stairs? No  Preparing food and eating?: No  Bathing or showering? No  Getting dressed: No  Getting to the toilet? No  Using the toilet:No  Moving around from place to place: No  In the past year have you fallen or had a near fall?:yes Are you sexually active? No  Do you have more than one partner? No   Hearing Difficulties: No  Do you often ask people to speak up or repeat themselves? No  Do you experience ringing or noises in your ears? No Do you have difficulty understanding  soft or whispered voices? No  Do you feel that you have a problem with memory? No Do you often misplace items? No  Do you feel safe at home? Yes  Cognitive Testing  Alert? Yes Normal Appearance?Yes  Oriented to person? Yes Place? Yes  Time? Yes  Recall of three objects? Yes  Can perform simple calculations? Yes  Displays appropriate judgment?Yes  Can read the correct time from a watch face?Yes   List the Names of Other Physician/Practitioners you currently use:   Cardiology     Screening Tests / Date DECLINES IMMUNZIATIONS  Colonoscopy    2011                  ROS:  GEN- denies fatigue, fever, weight loss,weakness, recent illness HEENT- denies eye drainage, change in vision, nasal discharge, CVS- denies chest pain, palpitations RESP- denies SOB, cough, wheeze ABD- denies N/V, change in stools, abd pain GU- denies dysuria, hematuria, dribbling, incontinence MSK- denies joint pain, muscle aches, injury Neuro- denies headache, dizziness, syncope, seizure activity  Physical: GEN- NAD, alert and oriented x3 HEENT- PERRL, EOMI, non injected sclera, pink conjunctiva, MMM, oropharynx clear, TM clear bilat  Neck- Supple, no thryomegaly, no bruit, bruising beneath chin, no hematoma  CVS- RRR, no murmur RESP-CTAB ABD-NABS,soft,TN,ND  EXT- No edema Pulses- Radial, DP- 2+    Assessment:    Annual wellness medicare exam   Plan:    During  the course of the visit the patient was educated and counseled about appropriate screening and preventive services including:  Declines immunizations Appt made for f/u with cardiologist to discuss concerns about continuing his cardiac medications Referral to ENT for failed hearing exam  PSA elevated but has BPH he does not want any treatment if he did have prostate cancer  Cholesterol controlled CKD- stage II  Thyroid no longer in subclinical state  CHF- compensated   Diet review for nutrition referral? Yes ____ Not Indicated __x__   Patient Instructions (the written plan) was given to the patient.  Medicare Attestation  I have personally reviewed:  The patient's medical and social history  Their use of alcohol, tobacco or illicit drugs  Their current medications and supplements  The patient's functional ability including ADLs,fall risks, home safety risks, cognitive, and hearing and visual impairment  Diet and physical activities  Evidence for depression or mood disorders  The patient's weight, height, BMI, and visual acuity have been recorded in the chart. I have made referrals, counseling, and provided education to the patient based on review of the above and I have provided the patient with a written personalized care plan for preventive services.

## 2017-01-06 NOTE — Patient Instructions (Addendum)
Referral to ENT for hearing [phone]- Cardiology Continue current medications F/U 6 months

## 2017-01-16 ENCOUNTER — Ambulatory Visit (INDEPENDENT_AMBULATORY_CARE_PROVIDER_SITE_OTHER): Payer: Medicare Other | Admitting: Cardiovascular Disease

## 2017-01-16 ENCOUNTER — Encounter: Payer: Self-pay | Admitting: Cardiovascular Disease

## 2017-01-16 VITALS — BP 136/78 | HR 74 | Ht 69.0 in | Wt 196.4 lb

## 2017-01-16 DIAGNOSIS — I493 Ventricular premature depolarization: Secondary | ICD-10-CM

## 2017-01-16 DIAGNOSIS — I2510 Atherosclerotic heart disease of native coronary artery without angina pectoris: Secondary | ICD-10-CM

## 2017-01-16 DIAGNOSIS — E785 Hyperlipidemia, unspecified: Secondary | ICD-10-CM

## 2017-01-16 DIAGNOSIS — I5022 Chronic systolic (congestive) heart failure: Secondary | ICD-10-CM

## 2017-01-16 DIAGNOSIS — I10 Essential (primary) hypertension: Secondary | ICD-10-CM

## 2017-01-16 DIAGNOSIS — I255 Ischemic cardiomyopathy: Secondary | ICD-10-CM

## 2017-01-16 MED ORDER — CARVEDILOL 3.125 MG PO TABS: 3.1250 mg | ORAL_TABLET | Freq: Two times a day (BID) | ORAL | 3 refills | Status: DC

## 2017-01-16 NOTE — Patient Instructions (Signed)
Medication Instructions:   Change Coreg to 3.125 mg by mouth twice daily    Labwork: none  Testing/Procedures: none  Follow-Up: Your physician recommends that you schedule a follow-up appointment in: 12 months. Please call our office in about 9 months to schedule this appointment.     Any Other Special Instructions Will Be Listed Below (If Applicable).     If you need a refill on your cardiac medications before your next appointment, please call your pharmacy.

## 2017-01-16 NOTE — Progress Notes (Signed)
Chief Complaint  Patient presents with  . Follow-up     History of Present Illness: 83 yo male with history of CAD, PVCs, HLD, HTN and CVA who is here today for follow up. He has been followed by Dr. Gwenlyn Found. I am meeting him in the office for the first time today. I performed his cardiac cath in May 2017. His cardiac cath showed a severe mid LAD stenosis treated with a drug eluting stent and a severe stenosis in the obtuse marginal artery treated with a drug eluting stent. Echo May 2017 with LVEF=30-35% prior to his PCI. After the PCI, Echo in August 2017 showed improvement in LVEF to 40-45%. Also noted to have mild mitral valve regurgitation. He is known to have PVCs. He has stopped his Plavix recently due to easy bruising.   He is here today for follow up. The patient denies any chest pain, dyspnea, palpitations, lower extremity edema, orthopnea, PND, dizziness, near syncope or syncope.   Primary Care Physician: Karis Juba, PA-C  Past Medical History:  Diagnosis Date  . CAD (coronary artery disease)   . CHF (congestive heart failure) ([place])   . Frequent unifocal PVCs    Palpitations since early 20s, PVCs and pairs  . Hyperlipidemia   . Hypertension   . Stroke [facility]) 2009    Past Surgical History:  Procedure Laterality Date  . CARDIAC CATHETERIZATION N/A 01/23/2016   Procedure: Right/Left Heart Cath and Coronary Angiography;  Surgeon: Burnell Blanks, MD;  Location: [place] [facility];  Service: Cardiovascular;  Laterality: N/A;  . CARDIAC CATHETERIZATION N/A 01/23/2016   Procedure: Coronary Stent Intervention;  Surgeon: Burnell Blanks, MD;  Location: [place] [facility];  Service: Cardiovascular;  Laterality: N/A;  . HERNIA REPAIR  06/1992    Current Outpatient Prescriptions  Medication Sig Dispense Refill  . Ascorbic Acid (VITAMIN C PO) Take 500 mg by mouth 3 (three) times a week.     Marland Kitchen aspirin 81 MG chewable tablet Chew 1 tablet (81 mg total) by mouth daily.  (Patient taking differently: Chew 81 mg by mouth 3 (three) times a week. ) 90 tablet 3  . Coenzyme Q10 (COQ10) 100 MG CAPS Take 1 capsule by mouth daily.    . furosemide (LASIX) 20 MG tablet Take 1 tablet (20 mg total) by mouth daily. (Patient taking differently: Take 20 mg by mouth every other day. ) 90 tablet 1  . lisinopril (PRINIVIL,ZESTRIL) 2.5 MG tablet Take 1.25 mg by mouth daily.    . Magnesium 100 MG TABS Take 1 tablet by mouth 2 (two) times daily.    . Misc Natural Products (COMPLETE PROSTATE HEALTH) TABS Take by mouth.    . Potassium 99 MG TABS Take 99 mg by mouth daily.     . vitamin E 400 UNIT capsule Take 400 Units by mouth 3 (three) times a week.    . carvedilol (COREG) 3.125 MG tablet Take 1 tablet (3.125 mg total) by mouth 2 (two) times daily. 180 tablet 3   No current facility-administered medications for this visit.     No Known Allergies  Social History   Social History  . Marital status: Married    Spouse name: N/A  . Number of children: N/A  . Years of education: N/A   Occupational History  . Not on file.   Social History Main Topics  . Smoking status: Never Smoker  . Smokeless tobacco: Never Used  . Alcohol use No  . Drug use:  No  . Sexual activity: Not Currently   Other Topics Concern  . Not on file   Social History Narrative  . No narrative on file    Family History  Problem Relation Age of Onset  . Stroke Mother   . Early death Mother   . Cancer Brother     Review of Systems:  As stated in the HPI and otherwise negative.   BP 136/78   Pulse 74   Ht 5\' 9"  (1.753 m)   Wt 196 lb 6.4 oz (89.1 kg)   BMI 29.00 kg/m   Physical Examination: General: Well developed, well nourished, NAD  HEENT: OP clear, mucus membranes moist  SKIN: warm, dry. No rashes. Neuro: No focal deficits  Musculoskeletal: Muscle strength 5/5 all ext  Psychiatric: Mood and affect normal  Neck: No JVD, no carotid bruits, no thyromegaly, no lymphadenopathy.    Lungs:Clear bilaterally, no wheezes, rhonci, crackles Cardiovascular: Regular rate and rhythm. No murmurs, gallops or rubs. Abdomen:Soft. Bowel sounds present. Non-tender.  Extremities: No lower extremity edema. Pulses are 2 + in the bilateral DP/PT.  EKG:  EKG is ordered today. The ekg ordered today demonstrates Sinus, PVCs with bigeminy.  Recent Labs: 01/21/2016: B Natriuretic Peptide 1,049.6 12/30/2016: ALT 17; BUN 21; Creat 1.30; Hemoglobin 15.0; Platelets 135; Potassium 4.7; Sodium 140; TSH 4.20   Lipid Panel    Component Value Date/Time   CHOL 151 12/30/2016 0818   TRIG 98 12/30/2016 0818   HDL 45 12/30/2016 0818   CHOLHDL 3.4 12/30/2016 0818   VLDL 20 12/30/2016 0818   LDLCALC 86 12/30/2016 0818     Wt Readings from Last 3 Encounters:  01/16/17 196 lb 6.4 oz (89.1 kg)  01/06/17 198 lb (89.8 kg)  03/27/16 207 lb 6.4 oz (94.1 kg)     Other studies Reviewed: Additional studies/ records that were reviewed today include: . Review of the above records demonstrates:   Assessment and Plan:   1. CAD without angina: He has no chest pain suggestive of angina. Will continue ASA, Plavix, beta blocker. He does not tolerate statins.   2. HTN: BP is controlled. No changes.   3. Hyperlipidemia: LDL is near goal. He refuses to restart a statin.    4. Ischemic cardiomyopathy/Chronic systolic CHF: Volume status is ok today. LVEF improved post PCI. Continue Lasix. Will also continue beta blocker and Ace-inh.   5. PVCs: He has had these for years. He has no palpitations.   Current medicines are reviewed at length with the patient today.  The patient does not have concerns regarding medicines.  The following changes have been made:  no change  Labs/ tests ordered today include:   Orders Placed This Encounter  Procedures  . EKG 12-Lead     Disposition:   FU with me in 12 months   Signed, Lauree Chandler, MD 01/16/2017 11:28 AM    [facility]  [facility], [place], [place]  [address] Phone: [phone]; Fax: [phone]

## 2017-01-30 ENCOUNTER — Telehealth: Payer: Self-pay | Admitting: Cardiovascular Disease

## 2017-01-30 NOTE — Telephone Encounter (Signed)
He should just stop the Coreg with HR in the 40s. cdm

## 2017-01-30 NOTE — Telephone Encounter (Signed)
New Message  Pt c/o medication issue:  1. Name of Medication: Coreg   2. How are you currently taking this medication (dosage and times per day)? 3.125mg    3. Are you having a reaction (difficulty breathing--STAT)? bp low 114/40 pulse 40  4. What is your medication issue? Pt wife would to discuss low bp. Please call back to discuss

## 2017-01-30 NOTE — Telephone Encounter (Signed)
I spoke with pt and his wife.  They report pt was out moving yard today and felt tired and "foggy like".  BP was 114/40 and heart rate 40.  He rechecked later and BP was 119/47 and heart rate 40.  He reports heart rate often runs 40-47 with occasional rates in the 70's.  Rechecked BP and pulse while on phone with me and it is 149/59 and 40.Does not check BP on a regular basis.  He has had PVC's for many years but is not feeling this now and feels heart rate is regular.  Feels more tired since Coreg changed at last office visit.  Was taking 3.215 mg daily and felt OK on this dose.  Currently taking 3.125 mg in the morning and half of this tablet in the evening.  Taking Lisinopril 2.5 mg by mouth daily.

## 2017-01-30 NOTE — Telephone Encounter (Signed)
I spoke with pt and gave him instructions from Dr. McAlhany 

## 2017-02-07 ENCOUNTER — Ambulatory Visit (INDEPENDENT_AMBULATORY_CARE_PROVIDER_SITE_OTHER): Payer: Medicare Other | Admitting: Otolaryngology

## 2017-02-09 ENCOUNTER — Ambulatory Visit (INDEPENDENT_AMBULATORY_CARE_PROVIDER_SITE_OTHER): Payer: Medicare Other | Admitting: Otolaryngology

## 2017-02-09 DIAGNOSIS — H903 Sensorineural hearing loss, bilateral: Secondary | ICD-10-CM | POA: Diagnosis not present

## 2017-02-09 DIAGNOSIS — H608X3 Other otitis externa, bilateral: Secondary | ICD-10-CM

## 2017-03-07 ENCOUNTER — Other Ambulatory Visit: Payer: Self-pay | Admitting: Family Medicine

## 2017-04-20 ENCOUNTER — Ambulatory Visit (INDEPENDENT_AMBULATORY_CARE_PROVIDER_SITE_OTHER): Payer: Medicare Other | Admitting: Family Medicine

## 2017-04-20 ENCOUNTER — Encounter: Payer: Self-pay | Admitting: Family Medicine

## 2017-04-20 VITALS — BP 128/68 | HR 52 | Temp 97.6°F | Resp 14 | Ht 69.0 in | Wt 195.0 lb

## 2017-04-20 DIAGNOSIS — R972 Elevated prostate specific antigen [PSA]: Secondary | ICD-10-CM | POA: Diagnosis not present

## 2017-04-20 DIAGNOSIS — N400 Enlarged prostate without lower urinary tract symptoms: Secondary | ICD-10-CM | POA: Diagnosis not present

## 2017-04-20 DIAGNOSIS — I255 Ischemic cardiomyopathy: Secondary | ICD-10-CM

## 2017-04-20 DIAGNOSIS — R001 Bradycardia, unspecified: Secondary | ICD-10-CM | POA: Diagnosis not present

## 2017-04-20 HISTORY — DX: Bradycardia, unspecified: R00.1

## 2017-04-20 LAB — URINALYSIS, MICROSCOPIC ONLY
CASTS: NONE SEEN [LPF]
Crystals: NONE SEEN [HPF]
YEAST: NONE SEEN [HPF]

## 2017-04-20 LAB — URINALYSIS, ROUTINE W REFLEX MICROSCOPIC
Bilirubin Urine: NEGATIVE
Glucose, UA: NEGATIVE
KETONES UR: NEGATIVE
LEUKOCYTES UA: NEGATIVE
NITRITE: NEGATIVE
PROTEIN: NEGATIVE
Specific Gravity, Urine: 1.015 (ref 1.001–1.035)
pH: 6 (ref 5.0–8.0)

## 2017-04-20 NOTE — Assessment & Plan Note (Signed)
Bradycardia noted despite being off of all rate blocking medications. He does have this and his family history and a few of his siblings have had to have pacemakers placed. I did broach the subject that if nothing else is found this may be needed which he does not want to have any surgical intervention. His last echo was back in 2017 and I'm referring back to his cardiologist O Hann's wife can have all the questions answered and see if he needs any further workup further bradycardia. At this time he is asymptomatic

## 2017-04-20 NOTE — Patient Instructions (Addendum)
F/U as needed We will call with results   

## 2017-04-20 NOTE — Assessment & Plan Note (Signed)
Known PSA, consider flomax with his current cardiac issues would not start this medication as he can decrease his blood pressure as well. I will send his urine off for urinalysis and culture made sure not missing any infection as well.

## 2017-04-20 NOTE — Progress Notes (Signed)
Subjective:    Patient ID: Patrick Maxwell, male    DOB: 1931-06-11, 83 y.o.   MRN: [id number]  Patient presents for BP Issues   Patient here to discuss his heart rate. His wife is been checking his blood pressure and heart rate noted that it still been running 30s to 40s. The only prescribed medication he takes daily as lisinopril 2.5 mg. He was having this problem back in May his cardiologist discontinued his carvedilol placed continues to have the lower heart rate. He does however seem to be asymptomatic except she noticed that he sleeps alone but more during the day. He denies any chest pain but does fill his PVCs at times. No increase in shortness of breath. She was concerned some of his supplements or the lisinopril was causing his heart rate to be low. These were reviewed in detail.  History of BPH and elevated PSA which she's had for many years. His last PSA was 5.5 done in April at their request. He does have urinary frequency and dribbling often has to stand for his urine stream comes out. This is been going on for many years but he states it has worsened over the past few months. He denies any dysuria or abdominal pain. He would consider using medication to help with his prostate.    Review Of Systems:  GEN- denies fatigue, fever, weight loss,weakness, recent illness HEENT- denies eye drainage, change in vision, nasal discharge, CVS- denies chest pain, palpitations RESP- denies SOB, cough, wheeze ABD- denies N/V, change in stools, abd pain GU- denies dysuria, hematuria, dribbling, incontinence MSK- denies joint pain, muscle aches, injury Neuro- denies headache, dizziness, syncope, seizure activity       Objective:    BP 128/68   Pulse (!) 52   Temp 97.6 F (36.4 C) (Oral)   Resp 14   Ht 5\' 9"  (1.753 m)   Wt 195 lb (88.5 kg)   SpO2 98%   BMI 28.80 kg/m  GEN- NAD, alert and oriented x3 HEENT- PERRL, EOMI, non injected sclera, pink conjunctiva, MMM, oropharynx  clear Neck- Supple, no thyromegaly CVS- bradycardia HR 65, few PVC  no murmur RESP-CTAB ABD-NABS,soft,NT,ND  EXT- No edema Pulses- Radial, DP- 2+        Assessment & Plan:      Problem List Items Addressed This Visit    Bradycardia    Bradycardia noted despite being off of all rate blocking medications. He does have this and his family history and a few of his siblings have had to have pacemakers placed. I did broach the subject that if nothing else is found this may be needed which he does not want to have any surgical intervention. His last echo was back in 2017 and I'm referring back to his cardiologist O Hann's wife can have all the questions answered and see if he needs any further workup further bradycardia. At this time he is asymptomatic      BPH with elevated PSA - Primary    Known PSA, consider flomax with his current cardiac issues would not start this medication as he can decrease his blood pressure as well. I will send his urine off for urinalysis and culture made sure not missing any infection as well.      Relevant Orders   Urinalysis, Routine w reflex microscopic   Urine Culture      Note: This dictation was prepared with Dragon dictation along with smaller phrase technology. Any transcriptional errors that result  from this process are unintentional.

## 2017-04-21 LAB — URINE CULTURE: Organism ID, Bacteria: NO GROWTH

## 2017-04-23 ENCOUNTER — Telehealth: Payer: Self-pay | Admitting: Cardiovascular Disease

## 2017-04-23 NOTE — Telephone Encounter (Signed)
Spoke with pt and wife to stop the coreg as recommended per MD. According to pt he had stop taking  Coreg on 01/30/17 after taking to the  nurse by phone to stop medication for HR in the 40's. Pt also states that he has been having hear loss on his right ear. Pt and wife have read that Furosemide would do that. Pt is taking Furosemide 20 mg every 48 hours. Pt is seen ENT for that.

## 2017-04-23 NOTE — Telephone Encounter (Signed)
Patient and his wife notified of Dr. Camillia Herter recommendations. Wife and patient understand to call back if he develops dizziness or lightheadedness. They verbalize understanding and thanked me for the call.

## 2017-04-23 NOTE — Telephone Encounter (Signed)
I do not think the Lasix will cause bradycardia. If he is holding the Coreg and is not having dizziness, I would not recommend anything else right now. Can we tell him to call us back if he begins to feel dizzy or lightheaded? Thanks, chris

## 2017-04-23 NOTE — Telephone Encounter (Signed)
New Message  Pt wife called requesting to speak with RN about pt pulse. She states its been low; ranging in the low 40s 41-43. Please call back to discuss

## 2017-04-23 NOTE — Telephone Encounter (Signed)
I would have him stop the coreg and continue to follow BP and HR next few days. Will not restart Coreg. Thanks,chris

## 2017-04-23 NOTE — Telephone Encounter (Signed)
Pt's wife called regarding pt's low HR 41 to 48 beats/minute . Pt states that he has been having a low HR since he had a cardiac cath and stent placement on May 2017. Pt states that he and his wife forgot to mentioned this issue when he was seen by Dr Angelena Form on 01/16/17. Pt's BP yesterday was 112/57 HR 46 beats/minute in the evening BP 125/51 HR 41, this morning BP 145/85 HR 43 beats/minute. Pt denies any symptoms even when its low as 41 beats/minute. Pt is aware that this message will be send to MD for recommendations.

## 2017-04-28 ENCOUNTER — Telehealth: Payer: Self-pay | Admitting: Cardiovascular Disease

## 2017-04-28 NOTE — Telephone Encounter (Signed)
Marcelus Dubberly calling,states that she has some questions about patient's blood pressure medication.

## 2017-04-28 NOTE — Telephone Encounter (Signed)
Patient's wife is calling back and states that the patient has experienced hearing loss over the past year. 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 She states that in doing research she found that Lisinopril can cause hearing loss and she wants to know if he can take something else for his BP. She states that she has been monitoring his BP closely and that it has been between 112/57 and 165/80. She states that his HR has been 41-48. She states that the patient stopped taking coreg in May. She states that the patient does not have any lightheadedness or dizziness and denies having any other symptoms with his HR being lower. Advised again for them to let us know if he becomes symptomatic. She states that their main concern is his hearing loss and they want to know if he can take something else other than the Lisinopril. She states that he had perfect hearing and normal HRs prior to having his stents in May 2017.  Made wife aware that the information would be forwarded to Dr. Angelena Form and his RN for review and recommendation.

## 2017-04-28 NOTE — Telephone Encounter (Signed)
Pt advised Dr Angelena Form okay to change lisinopril to cozaar 25 mg daily. Pt is going to check on price of cozaar 25 mg daily and call back to let me know if he wants to make change from lisinopril to cozaar 25 mg daily.

## 2017-04-28 NOTE — Telephone Encounter (Signed)
Thanks

## 2017-04-28 NOTE — Telephone Encounter (Signed)
Pt's wife called back and decided to continue lisinopril for now. Pt's wife asked me to let Dr Angelena Form know that pt was going to follow-up with PCP about thyroid function and  possible relationship to low pulse.   I will forward to Dr Angelena Form.

## 2017-04-28 NOTE — Telephone Encounter (Signed)
It is not likely that the Lisinopril has caused a change in his hearing but we can change to something else. We can stop Lisinopril and start Cozaar 25 mg daily. Follow BP closely at home for next few weeks. Patrick Maxwell

## 2017-05-05 ENCOUNTER — Telehealth: Payer: Self-pay | Admitting: *Deleted

## 2017-05-05 DIAGNOSIS — R7989 Other specified abnormal findings of blood chemistry: Secondary | ICD-10-CM

## 2017-05-05 NOTE — Telephone Encounter (Signed)
Received call from patient wife, Mariann Laster.   Inquired as to if MD has given any more thought to beginning medication for Flomax. Per chart notes, PCP wanted patient to F/U with Cards since he has low blood pressure and Flomax can lower BP. No appointments noted in chart.   Also inquired as to patient thyroid function. States that thyroid has been high and should patient be on treatment. Noted one mildly elevated reading in 11/2015, but upon recheck in 12/2016, levels are normal.

## 2017-05-05 NOTE — Telephone Encounter (Signed)
At this time his thyroid levels are normal  ( Done in April) and he does not need any medication or treatment, I am just going to monitor levels periodically. This is not causing any bradycardia  If he has been cleared by cardiology  he can try the flomax 0.4mg  at bedtime for his prostate. If he has drop in blood pressure, dizziness after starting the medication he needs to stop and let us know.

## 2017-05-06 ENCOUNTER — Telehealth: Payer: Self-pay | Admitting: *Deleted

## 2017-05-06 MED ORDER — TAMSULOSIN HCL 0.4 MG PO CAPS: 0.4000 mg | ORAL_CAPSULE | Freq: Every day | ORAL | 3 refills | Status: DC

## 2017-05-06 NOTE — Telephone Encounter (Signed)
I spoke with pt's wife and gave her information from Dr. Angelena Form.  She is aware flomax will be ordered by primary care. Wife is also concerned about pt's heart rate running in 40's. Will start another phone note to address this.

## 2017-05-06 NOTE — Telephone Encounter (Signed)
I am ok with him starting Flomax. He should follow his BP daily at home for the first few weeks while taking it. chris

## 2017-05-06 NOTE — Telephone Encounter (Signed)
I spoke with pt's wife and gave her information from Dr. Angelena Form.  I also spoke with pt who reports he is feeling fine and he does not want to wear a monitor at this time.  I asked him to let us know if he decided he would like to proceed with monitor.  I told him to let us know if he develops dizziness or feeling as if he may pass out.

## 2017-05-06 NOTE — Telephone Encounter (Signed)
Call placed to patient. LMTRC.  

## 2017-05-06 NOTE — Telephone Encounter (Signed)
I do not recommend him seeing the naturalist with his other medical problems I can send him to an endocrinologist who specializes in thyroid disorders Its her choice.

## 2017-05-06 NOTE — Addendum Note (Signed)
Addended by: Sheral Flow on: 05/06/2017 02:52 PM   Modules accepted: Orders

## 2017-05-06 NOTE — Telephone Encounter (Addendum)
Call placed to patient wife and Mariann Laster made aware.   States that she is concerned that TSH is too high, but she will have her natural doctor look into putting patient on Iodine.

## 2017-05-06 NOTE — Telephone Encounter (Signed)
Call placed to patient wife Mariann Laster. Agrees with referral.   Orders placed.

## 2017-05-06 NOTE — Telephone Encounter (Signed)
Call placed to patient wife, Patrick Maxwell.Marland Kitchen [place].

## 2017-05-06 NOTE — Telephone Encounter (Signed)
Can we get him to come in for a 48 hour monitor? Thanks, chris

## 2017-05-06 NOTE — Telephone Encounter (Signed)
I spoke with pt's wife. She reports pt stopped Coreg in May based on phone conversation from 5/11.  His heart rate continues to be 41-46.  Occasionally will go up to low 50's mid day.  He is very active and not having any symptoms.  Able to walk and work in yard without problems.  He checks pulse with a machine that also monitors his BP.  He wore a monitor about 2 years ago. Pt's wife is concerned about pt's heart rate continuing to be in 40's.  They are also following up with primary care regarding his thyroid tests.

## 2017-05-06 NOTE — Telephone Encounter (Signed)
Call placed to patient wife, Mariann Laster. [place].

## 2017-06-24 ENCOUNTER — Telehealth: Payer: Self-pay | Admitting: Cardiovascular Disease

## 2017-06-24 NOTE — Telephone Encounter (Signed)
New message    Pt c/o of Chest Pain: STAT if CP now or developed within 24 hours  1. Are you having CP right now? No. Pt wife states pt is having arm pain and a burning sensations  2. Are you experiencing any other symptoms (ex. SOB, nausea, vomiting, sweating)? No   3. How long have you been experiencing CP? Started last night  4. Is your CP continuous or coming and going? Continuous   5. Have you taken Nitroglycerin? No, pt does not have. Took aspirin.  ?

## 2017-06-24 NOTE — Telephone Encounter (Signed)
Received call transferred directly from operator and spoke with pt and his wife.  They report pt had trouble sleeping due to left arm pain. Is between his shoulder and elbow. Not worse with movement. Some burning. No numbness.  He worked in the yard [employer] on Monday. He took 2 ASA and pain completely went away. This morning he reports having slight pain in arm. No chest pain. No shortness of breath.  Never had arm pain prior to interventions.  I offered pt appointment in our office today but he is unable to come in today.  I scheduled him to see B. Rosita Fire, [place] tomorrow at 2 PM.  I instructed him if pain worsens or symptoms change he should go to ED for evaluation.

## 2017-06-24 NOTE — Telephone Encounter (Signed)
Agree. Thanks

## 2017-06-25 ENCOUNTER — Encounter: Payer: Self-pay | Admitting: Cardiology

## 2017-06-25 ENCOUNTER — Ambulatory Visit (INDEPENDENT_AMBULATORY_CARE_PROVIDER_SITE_OTHER): Payer: Medicare Other | Admitting: Cardiology

## 2017-06-25 VITALS — BP 130/80 | HR 51 | Resp 16 | Ht 69.0 in | Wt 193.0 lb

## 2017-06-25 DIAGNOSIS — I255 Ischemic cardiomyopathy: Secondary | ICD-10-CM

## 2017-06-25 DIAGNOSIS — I2510 Atherosclerotic heart disease of native coronary artery without angina pectoris: Secondary | ICD-10-CM

## 2017-06-25 DIAGNOSIS — I10 Essential (primary) hypertension: Secondary | ICD-10-CM

## 2017-06-25 MED ORDER — NITROGLYCERIN 0.4 MG SL SUBL: 0.4000 mg | SUBLINGUAL_TABLET | SUBLINGUAL | 3 refills | Status: DC | PRN

## 2017-06-25 NOTE — Progress Notes (Signed)
06/25/2017 Patrick Maxwell   1931/09/15  [id number]  Primary Physician Buelah Manis Modena Nunnery, MD Primary Cardiologist: Dr. Angelena Form   Reason for Visit/CC: left arm pain   HPI:  Patrick Maxwell is a 83 y.o. male followed by Dr. Angelena Form, with h/o CAD, iscehmic cardiomyopathy/ chronic systolic HF, HTN, HLD, CVA and PVCs. In May 2017, he had a LHC which showed a severe mid LAD stenosis treated with a drug eluting stent and a severe stenosis in the obtuse marginal artery treated with a drug eluting stent. Echo May 2017 with LVEF=30-35% prior to his PCI. After the PCI, Echo in August 2017 showed improvement in LVEF to 40-45%. Also noted to have mild mitral valve regurgitation. He was last seen by Dr. Angelena Form on 01/16/17. He was doing well w/o issues. He had issues with bradycardia, thus his Coreg was discontinued. He was instructed to f/u in 12 months.   He presents to clinic today with new complaint of left arm pain that occurred 2 days ago after he had been in his yard planting flower bulbs. He was doing a lot of digging. He had no pain during yard work. Symptoms started hours after. Left upper arm pain. No chest pain. No associated dyspnea, palpitations, dizziness, nausea. He did not have arm pain in 2017 prior to his stents. He only had dyspnea. His recent arm pain improved after taking a lasix tablet (pt only takes PRN based on weight gain, his weight was up 3 lb that day and returned to baseline after diuretic).  He is here today with his wife. He denies any recurrent arm pain. No CP or dyspnea. No issues with exertion since his episode 2 days ago. BP is well controlled. EKG shows SR with PVCs.   Current Meds  Medication Sig  . Ascorbic Acid (VITAMIN C PO) Take 500 mg by mouth 3 (three) times a week.   Marland Kitchen aspirin 81 MG chewable tablet Chew 1 tablet (81 mg total) by mouth daily. (Patient taking differently: Chew 81 mg by mouth 3 (three) times a week. )  . Coenzyme Q10 (COQ10) 100 MG CAPS Take 1  capsule by mouth daily.  . furosemide (LASIX) 20 MG tablet Take 1 tablet (20 mg total) by mouth daily. (Patient taking differently: Take 20 mg by mouth every other day as needed. )  . lisinopril (PRINIVIL,ZESTRIL) 2.5 MG tablet TAKE ONE TABLET BY MOUTH ONCE DAILY  . Magnesium 100 MG TABS Take 1 tablet by mouth 2 (two) times daily.  . Misc Natural Products (COMPLETE PROSTATE HEALTH) TABS Take by mouth.  . Potassium 99 MG TABS Take 99 mg by mouth daily.   . tamsulosin (FLOMAX) 0.4 MG CAPS capsule Take 1 capsule (0.4 mg total) by mouth daily.  . vitamin E 400 UNIT capsule Take 400 Units by mouth 3 (three) times a week.   No Known Allergies Past Medical History:  Diagnosis Date  . CAD (coronary artery disease)   . CHF (congestive heart failure) ([place])   . Frequent unifocal PVCs    Palpitations since early 20s, PVCs and pairs  . Hyperlipidemia   . Hypertension   . Stroke [facility]) 2009   Family History  Problem Relation Age of Onset  . Stroke Mother   . Early death Mother   . Cancer Brother    Past Surgical History:  Procedure Laterality Date  . CARDIAC CATHETERIZATION N/A 01/23/2016   Procedure: Right/Left Heart Cath and Coronary Angiography;  Surgeon: Burnell Blanks, MD;  Location: [place] [facility];  Service: Cardiovascular;  Laterality: N/A;  . CARDIAC CATHETERIZATION N/A 01/23/2016   Procedure: Coronary Stent Intervention;  Surgeon: Burnell Blanks, MD;  Location: [place] [facility];  Service: Cardiovascular;  Laterality: N/A;  . HERNIA REPAIR  06/1992   Social History   Social History  . Marital status: Married    Spouse name: N/A  . Number of children: N/A  . Years of education: N/A   Occupational History  . Not on file.   Social History Main Topics  . Smoking status: Never Smoker  . Smokeless tobacco: Never Used  . Alcohol use No  . Drug use: No  . Sexual activity: Not Currently   Other Topics Concern  . Not on file   Social History Narrative  . No  narrative on file     Review of Systems: General: negative for chills, fever, night sweats or weight changes.  Cardiovascular: negative for chest pain, dyspnea on exertion, edema, orthopnea, palpitations, paroxysmal nocturnal dyspnea or shortness of breath Dermatological: negative for rash Respiratory: negative for cough or wheezing Urologic: negative for hematuria Abdominal: negative for nausea, vomiting, diarrhea, bright red blood per rectum, melena, or hematemesis Neurologic: negative for visual changes, syncope, or dizziness All other systems reviewed and are otherwise negative except as noted above.   Physical Exam:  Blood pressure 130/80, pulse (!) 51, resp. rate 16, height 5\' 9"  (1.753 m), weight 193 lb (87.5 kg), SpO2 93 %.  General appearance: alert, cooperative and no distress Neck: no carotid bruit and no JVD Lungs: clear to auscultation bilaterally Heart: regular rate and rhythm, S1, S2 normal, no murmur, click, rub or gallop Extremities: extremities normal, atraumatic, no cyanosis or edema Pulses: 2+ and symmetric Skin: Skin color, texture, turgor normal. No rashes or lesions Neurologic: Grossly normal  EKG NSR with PVCs -- personally reviewed   ASSESSMENT AND PLAN:   1. Left Arm Pain: Atypical brief pain that occurred hours after moderate yard work. No exertional symptoms while performing work. No chest pain or dyspnea. He denies any recurrent symptoms since episode 2 days ago. EKG shows SR with PVCs, which he has a long history of. He has a h/o CAD s/p PCI to LAD and OM 01/2016, however doubt recent symptoms were cardiac related. Suspect likely musculoskeletal arm pain from yard work. No further w/u at this time. Pt instructed to notify us if symptoms return and if triggered by or worse with exertion.   2. CAD: per above. Continue medical therapy.   3. Ischemic CM/ Chronic Systolic HF: EF 42-68%. He is euvolemic on exam. He takes lasix PRN based on daily weights.  Continue ACE-I therapy. He is no longer on a BB. Coreg was discontinued 01/2017 by Dr. Angelena Form given bradycardia.   4. PVCs: no longer on BB given reports of low pulse. Low pulse rates at home likely 2/2 to frequent PVCs. However Coreg was stopped by primary cardiologist (see telephone note 01/30/17). MD to decide if to restart at his next f/u appt.   5. HTN: controlled on current regimen. No changes made today.   6. HLD: LDL was 86 mg/dL in April. Pt with h/o statin intolerance and refuses to restart. I discussed new therapies, PCSK9 inhibitors, for LDL lowering/ risk factor reduction. He will think about this and will discusss with Dr. Angelena Form at his next f/u.   Follow-Up w/ Dr. Angelena Form 12/2017.   Dawn Convery Ladoris Gene, M[facility] 06/25/2017 2:44 PM

## 2017-06-25 NOTE — Patient Instructions (Signed)
Medication Instructions:  Your physician has recommended you make the following change in your medication:  1.  START Nitroglycerin 0.4 s/l tablets USE AS DIRECTED ON THE BOTTLE   Labwork: None ordered  Testing/Procedures: None ordered  Follow-Up: Your physician wants you to follow-up in: [place] DR. Angelena Form   You will receive a reminder letter in the mail two months in advance. If you don't receive a letter, please call our office to schedule the follow-up appointment.   Any Other Special Instructions Will Be Listed Below (If Applicable).     If you need a refill on your cardiac medications before your next appointment, please call your pharmacy.

## 2017-07-06 ENCOUNTER — Other Ambulatory Visit: Payer: Self-pay | Admitting: Family Medicine

## 2017-07-06 MED ORDER — TAMSULOSIN HCL 0.4 MG PO CAPS: 0.4000 mg | ORAL_CAPSULE | Freq: Every day | ORAL | 3 refills | Status: DC

## 2017-07-07 ENCOUNTER — Ambulatory Visit: Payer: Medicare Other | Admitting: Family Medicine

## 2017-08-17 ENCOUNTER — Other Ambulatory Visit: Payer: Self-pay

## 2017-08-17 ENCOUNTER — Encounter: Payer: Self-pay | Admitting: Family Medicine

## 2017-08-17 ENCOUNTER — Telehealth: Payer: Self-pay | Admitting: Family Medicine

## 2017-08-17 ENCOUNTER — Ambulatory Visit (INDEPENDENT_AMBULATORY_CARE_PROVIDER_SITE_OTHER): Payer: Medicare Other | Admitting: Family Medicine

## 2017-08-17 VITALS — BP 132/68 | HR 60 | Temp 97.9°F | Resp 16 | Ht 69.0 in | Wt 196.0 lb

## 2017-08-17 DIAGNOSIS — R972 Elevated prostate specific antigen [PSA]: Secondary | ICD-10-CM | POA: Diagnosis not present

## 2017-08-17 DIAGNOSIS — I255 Ischemic cardiomyopathy: Secondary | ICD-10-CM

## 2017-08-17 DIAGNOSIS — N410 Acute prostatitis: Secondary | ICD-10-CM

## 2017-08-17 DIAGNOSIS — R300 Dysuria: Secondary | ICD-10-CM | POA: Diagnosis not present

## 2017-08-17 DIAGNOSIS — N400 Enlarged prostate without lower urinary tract symptoms: Secondary | ICD-10-CM

## 2017-08-17 LAB — URINALYSIS, ROUTINE W REFLEX MICROSCOPIC
Bilirubin Urine: NEGATIVE
Glucose, UA: NEGATIVE
Hgb urine dipstick: NEGATIVE
KETONES UR: NEGATIVE
Leukocytes, UA: NEGATIVE
NITRITE: NEGATIVE
PH: 5.5 (ref 5.0–8.0)
Protein, ur: NEGATIVE
SPECIFIC GRAVITY, URINE: 1.015 (ref 1.001–1.03)

## 2017-08-17 MED ORDER — CIPROFLOXACIN HCL 250 MG PO TABS: 250.0000 mg | ORAL_TABLET | Freq: Two times a day (BID) | ORAL | 0 refills | Status: DC

## 2017-08-17 NOTE — Progress Notes (Signed)
   Subjective:    Patient ID: Patrick Maxwell, male    DOB: [birth year], 83 y.o.   MRN: [id number]  Patient presents for Dysuria (states that he has been having burning with urination recently at night- does have BPH)  Patient here with dysuria for the past days. He has burning sensation at night time.  He does have history of BPH with elevated PSA.  He is on Flomax. He feels like he does empty his urine all the way when he takes his time  No pain and no blood in urine No constipation  No fever, no vomiting He would like to go ahead and see Urology     Review Of Systems:  GEN- denies fatigue, fever, weight loss,weakness, recent illness HEENT- denies eye drainage, change in vision, nasal discharge, CVS- denies chest pain, palpitations RESP- denies SOB, cough, wheeze ABD- denies N/V, change in stools, abd pain GU- + dysuria, hematuria, dribbling, incontinence MSK- denies joint pain, muscle aches, injury Neuro- denies headache, dizziness, syncope, seizure activity       Objective:    BP 132/68   Pulse 60   Temp 97.9 F (36.6 C) (Oral)   Resp 16   Ht 5\' 9"  (1.753 m)   Wt 196 lb (88.9 kg)   SpO2 97%   BMI 28.94 kg/m  GEN- NAD, alert and oriented x3 HEENT- PERRL, EOMI, non injected sclera, pink conjunctiva, MMM, oropharynx clear Neck- Supple, no thyromegaly CVS- Bradycardic, PVC, no murmur RESP-CTAB ABD-NABS,soft,NT,ND, no CVA tenderness EXT- No edema Pulses- Radial  2+        Assessment & Plan:      Problem List Items Addressed This Visit      Unprioritized   BPH with elevated PSA    Continue flomax, refer to Urology discussed initially patient agreed he then  Urine culture sent  Possible prostatitis as wel Start Cipro 250mg  BID for 10 days       Other Visit Diagnoses    Acute prostatitis    -  Primary   Relevant Orders   Urinalysis, Routine w reflex microscopic   Urine Culture      Note: This dictation was prepared with Dragon dictation along with  smaller phrase technology. Any transcriptional errors that result from this process are unintentional.

## 2017-08-17 NOTE — Telephone Encounter (Signed)
He can come in Tuesday at 10 or 11am Drink his fluids

## 2017-08-17 NOTE — Assessment & Plan Note (Addendum)
Continue flomax, refer to Urology discussed initially patient agreed he then  Urine culture sent  Possible prostatitis as wel Start Cipro 250mg  BID for 10 days

## 2017-08-17 NOTE — Patient Instructions (Addendum)
Call if you want referral for urology  Antibiotics sent to pharmacy  F/U as needed

## 2017-08-17 NOTE — Telephone Encounter (Signed)
Pt called and states that he thinks he has a UTI - burning with urination and problems starting and stopping. He wanted to know if he could come and leave a sample since there are no apts for today or tomorrow?

## 2017-08-17 NOTE — Telephone Encounter (Signed)
Had cancellation for today at 2:30 - pt aware to come in at that time

## 2017-08-18 LAB — URINE CULTURE
MICRO NUMBER:: 81325589
SPECIMEN QUALITY:: ADEQUATE

## 2017-09-17 ENCOUNTER — Other Ambulatory Visit: Payer: Self-pay | Admitting: Family Medicine

## 2017-11-03 ENCOUNTER — Telehealth: Payer: Self-pay | Admitting: *Deleted

## 2017-11-03 NOTE — Telephone Encounter (Signed)
Received call from patient wife, Mariann Laster.   Inquired as to reason for endocrinology referral. Advised that she had asked about high thyroid level 04/2017 and thus referral was placed.   Also states that she is concerned that patient has decreased hearing in R ear. Appointment scheduled.

## 2017-11-09 ENCOUNTER — Encounter: Payer: Self-pay | Admitting: Family Medicine

## 2017-11-09 ENCOUNTER — Other Ambulatory Visit: Payer: Self-pay

## 2017-11-09 ENCOUNTER — Ambulatory Visit (INDEPENDENT_AMBULATORY_CARE_PROVIDER_SITE_OTHER): Payer: PPO | Admitting: Family Medicine

## 2017-11-09 VITALS — BP 132/68 | HR 57 | Temp 97.7°F | Resp 14 | Wt 195.2 lb

## 2017-11-09 DIAGNOSIS — E7800 Pure hypercholesterolemia, unspecified: Secondary | ICD-10-CM

## 2017-11-09 DIAGNOSIS — I499 Cardiac arrhythmia, unspecified: Secondary | ICD-10-CM

## 2017-11-09 DIAGNOSIS — H9113 Presbycusis, bilateral: Secondary | ICD-10-CM | POA: Diagnosis not present

## 2017-11-09 DIAGNOSIS — E039 Hypothyroidism, unspecified: Secondary | ICD-10-CM | POA: Diagnosis not present

## 2017-11-09 DIAGNOSIS — I493 Ventricular premature depolarization: Secondary | ICD-10-CM | POA: Diagnosis not present

## 2017-11-09 DIAGNOSIS — E038 Other specified hypothyroidism: Secondary | ICD-10-CM

## 2017-11-09 NOTE — Assessment & Plan Note (Addendum)
Recheck TSH in setting of his arrythemia

## 2017-11-09 NOTE — Assessment & Plan Note (Signed)
Check lipids, he takes OTC supplement red yeast rice, declines statins

## 2017-11-09 NOTE — Patient Instructions (Addendum)
Referral to Dr. Benjamine Mola  Cardiology appointment- [phone] - Sharrell Ku  Thursday  2/21 at 11:30am F/U 6 months for PHYSICAL

## 2017-11-09 NOTE — Progress Notes (Signed)
   Subjective:    Patient ID: Patrick Maxwell, male    DOB: 20-Dec-1930, 84 y.o.   MRN: [id number]  Patient presents for hearing problems in right ear  Pt here with difficulty hearing from right ear.   He continues to follow with cardiology, he is taking medications per report  But he is concerned they are causing his right hearing loss.  He was actually evaluated by ENT Dr. Benjamine Mola in May  2018, at that time told he had progressive bilat hearing loss, R>L. Discussed hearing aides he wanted to hold off and recheck in 6 months but never returned.    Last night had episode of dizziness, always feels his PVC's. He is off Coreg due to bradycardia in setting of the PVC   Review Of Systems:  GEN- denies fatigue, fever, weight loss,weakness, recent illness HEENT- denies eye drainage, change in vision, nasal discharge, CVS- denies chest pain, palpitations RESP- denies SOB, cough, wheeze ABD- denies N/V, change in stools, abd pain GU- denies dysuria, hematuria, dribbling, incontinence  MSK- denies joint pain, muscle aches, injury Neuro- denies headache, dizziness, syncope, seizure activity       Objective:    BP 132/68   Pulse (!) 57   Temp 97.7 F (36.5 C) (Oral)   Resp 14   Wt 195 lb 3.2 oz (88.5 kg)   SpO2 98%   BMI 28.83 kg/m  GEN- NAD, alert and oriented x3 HEENT- PERRL, EOMI, non injected sclera, pink conjunctiva, MMM, oropharynx clear, dry skin bilat ears, TM clear no effusion  Neck- Supple, no thyromegaly CVS- irregular rhythem, bradycardic HR 50's, no murmur RESP-CTAB ABD-NABS,soft,NT,ND EXT- No edema Pulses- Radial 2+  EKG- Multiple PVC's , sinus tachycardia HR 107       Assessment & Plan:      Problem List Items Addressed This Visit      Unprioritized   PVC's (premature ventricular contractions)   Subclinical hypothyroidism    Recheck TSH in setting of his arrythemia       Relevant Orders   TSH   Hypercholesteremia    Check lipids, he takes OTC  supplement red yeast rice, declines statins       Other Visit Diagnoses    Cardiac arrhythmia, unspecified cardiac arrhythmia type    -  Primary   He is having a mix of tachycardia with PVC's and Bradycardia, this may be associated with the dizziness. obtain labs, I called to schedule with his cardiologist   Relevant Orders   CBC with Differential/Platelet   Comprehensive metabolic panel   Lipid panel   EKG 12-Lead (Completed)   Presbycusis of both ears       Relevant Orders   Ambulatory referral to ENT      Note: This dictation was prepared with Dragon dictation along with smaller phrase technology. Any transcriptional errors that result from this process are unintentional.

## 2017-11-10 LAB — COMPREHENSIVE METABOLIC PANEL
AG Ratio: 1.7 (calc) (ref 1.0–2.5)
ALT: 18 U/L (ref 9–46)
AST: 22 U/L (ref 10–35)
Albumin: 4.2 g/dL (ref 3.6–5.1)
Alkaline phosphatase (APISO): 89 U/L (ref 40–115)
BILIRUBIN TOTAL: 0.9 mg/dL (ref 0.2–1.2)
BUN / CREAT RATIO: 15 (calc) (ref 6–22)
BUN: 18 mg/dL (ref 7–25)
CALCIUM: 9.5 mg/dL (ref 8.6–10.3)
CO2: 24 mmol/L (ref 20–32)
Chloride: 105 mmol/L (ref 98–110)
Creat: 1.19 mg/dL — ABNORMAL HIGH (ref 0.70–1.11)
GLUCOSE: 98 mg/dL (ref 65–99)
Globulin: 2.5 g/dL (calc) (ref 1.9–3.7)
POTASSIUM: 4.8 mmol/L (ref 3.5–5.3)
SODIUM: 141 mmol/L (ref 135–146)
TOTAL PROTEIN: 6.7 g/dL (ref 6.1–8.1)

## 2017-11-10 LAB — CBC WITH DIFFERENTIAL/PLATELET
BASOS ABS: 49 {cells}/uL (ref 0–200)
Basophils Relative: 0.8 %
EOS PCT: 4.2 %
Eosinophils Absolute: 256 cells/uL (ref 15–500)
HCT: 44.9 % (ref 38.5–50.0)
Hemoglobin: 14.8 g/dL (ref 13.2–17.1)
LYMPHS ABS: 1214 {cells}/uL (ref 850–3900)
MCH: 28.5 pg (ref 27.0–33.0)
MCHC: 33 g/dL (ref 32.0–36.0)
MCV: 86.3 fL (ref 80.0–100.0)
MONOS PCT: 7.5 %
MPV: 11.9 fL (ref 7.5–12.5)
NEUTROS PCT: 67.6 %
Neutro Abs: 4124 cells/uL (ref 1500–7800)
PLATELETS: 169 10*3/uL (ref 140–400)
RBC: 5.2 10*6/uL (ref 4.20–5.80)
RDW: 12.9 % (ref 11.0–15.0)
Total Lymphocyte: 19.9 %
WBC mixed population: 458 cells/uL (ref 200–950)
WBC: 6.1 10*3/uL (ref 3.8–10.8)

## 2017-11-10 LAB — SPECIMEN COMPROMISED

## 2017-11-10 LAB — TSH: TSH: 3.98 m[IU]/L (ref 0.40–4.50)

## 2017-11-10 LAB — LIPID PANEL
CHOL/HDL RATIO: 4 (calc) (ref <5.0)
Cholesterol: 178 mg/dL (ref <200)
HDL: 45 mg/dL (ref >40)
LDL CHOLESTEROL (CALC): 114 mg/dL — AB
NON-HDL CHOLESTEROL (CALC): 133 mg/dL — AB (ref <130)
Triglycerides: 90 mg/dL (ref <150)

## 2017-11-12 ENCOUNTER — Ambulatory Visit: Payer: Medicare Other | Admitting: Physician Assistant

## 2017-11-12 ENCOUNTER — Encounter: Payer: Self-pay | Admitting: *Deleted

## 2017-11-13 ENCOUNTER — Encounter: Payer: Self-pay | Admitting: Cardiology

## 2017-11-13 ENCOUNTER — Ambulatory Visit: Payer: PPO | Admitting: Cardiology

## 2017-11-13 VITALS — BP 134/82 | HR 101 | Ht 69.0 in | Wt 191.0 lb

## 2017-11-13 DIAGNOSIS — I493 Ventricular premature depolarization: Secondary | ICD-10-CM | POA: Diagnosis not present

## 2017-11-13 DIAGNOSIS — I5042 Chronic combined systolic (congestive) and diastolic (congestive) heart failure: Secondary | ICD-10-CM | POA: Diagnosis not present

## 2017-11-13 NOTE — Patient Instructions (Signed)
Medication Instructions:  Your physician recommends that you continue on your current medications as directed. Please refer to the Current Medication list given to you today.   Labwork: None Ordered   Testing/Procedures: Your physician has requested that you have an echocardiogram. Echocardiography is a painless test that uses sound waves to create images of your heart. It provides your doctor with information about the size and shape of your heart and how well your heart's chambers and valves are working. This procedure takes approximately one hour. There are no restrictions for this procedure.  Your physician has recommended that you wear a holter monitor. Holter monitors are medical devices that record the heart's electrical activity. Doctors most often use these monitors to diagnose arrhythmias. Arrhythmias are problems with the speed or rhythm of the heartbeat. The monitor is a small, portable device. You can wear one while you do your normal daily activities. This is usually used to diagnose what is causing palpitations/syncope (passing out).   Follow-Up: Your physician recommends that you return for a follow-up appointment on May 2 with Dr. Angelena Form   If you need a refill on your cardiac medications before your next appointment, please call your pharmacy.   Thank you for choosing [facility]! Christen Bame, RN [phone]

## 2017-11-13 NOTE — Progress Notes (Signed)
11/13/2017 [name]   [birth year]  [id number]  Primary Physician Buelah Manis Modena Nunnery, MD Primary Cardiologist: Dr. Angelena Form   Reason for Visit/CC: PVCs  HPI:  Patrick Maxwell is a 84 y.o. male followed by Dr. Angelena Form, with h/o CAD, iscehmic cardiomyopathy/ chronic systolic HF, HTN, HLD, CVA and PVCs. In May 2017, he had a LHC which showed a severe mid LAD stenosis treated with a drug eluting stent and a severe stenosis in the obtuse marginal artery treated with a drug eluting stent. Echo May 2017 with LVEF=30-35% prior to his PCI. After the PCI, Echo in August 2017 showed improvement in LVEF to 40-45%. Also noted to have mild mitral valve regurgitation. He was last seen by Dr. Angelena Form on 01/16/17. He was doing well w/o issues. He had issues with bradycardia, thus his Coreg was discontinued.   He presents to clinic today given increased PVCs. He was seen recently by his PCP and complained of a recent episode of dizziness. His PCP checked an EKG 11/09/17 that showed sinus tachycardia 107 bmp and frequent PVCs.  As outlined above his carvedilol was previously discontinued due to low pulse rates on home BP monitor.   His PCP checked TSH 11/09/17 and was normal. CBC and BMP also normal.    He is here with his wife today. He is not feeling palpitations at the moment and no further dizziness, but has been a bit fatigue. He also notes recent URI symptoms. Feels congested in his chest but no cough and no dyspnea. He also denies fever and chills. Just some malaise. He denies CP. Reports full med compliance. No LEE and has not required any lasix. He only takes this PRN.   Through discussion with patient and his wife, they seem to be very into herbal medicine and natural remedies. They use a lot of herbal supplements. Pt also expressed desire to come off of some of his prescription meds in the future. We discussed importance to continue his heart medications long term given his history.    2D Echo  05/06/16 Study Conclusions  - Procedure narrative: Patient refused the use of Definity image   enhancer. Transthoracic echocardiography. Image quality was   adequate. The study was technically difficult, as a result of   poor sound wave transmission. - Left ventricle: The cavity size was normal. Wall thickness was   increased in a pattern of mild LVH. Systolic function was mildly   to moderately reduced. The estimated ejection fraction was in the   range of 40% to 45%. Features are consistent with a pseudonormal   left ventricular filling pattern, with concomitant abnormal   relaxation and increased filling pressure (grade 2 diastolic   dysfunction). - Aortic valve: Moderately calcified annulus. Mildly thickened   leaflets. There was trivial regurgitation. - Mitral valve: Mildly to moderately calcified annulus. There was   mild regurgitation. - Right atrium: The atrium was mildly dilated. - Pulmonary arteries: Systolic pressure was moderately increased.   PA peak pressure: 55 mm Hg (S).   Current Meds  Medication Sig  . Ascorbic Acid (VITAMIN C PO) Take 500 mg by mouth 3 (three) times a week.   Marland Kitchen aspirin 81 MG tablet Take 81 mg by mouth every other day.  . Cholecalciferol (VITAMIN D PO) Take 5,000 Units by mouth daily.  . Coenzyme Q10 (COQ10) 100 MG CAPS Take 1 capsule by mouth daily.  . furosemide (LASIX) 20 MG tablet Take 1 tablet (20 mg total) by mouth daily. (  Patient taking differently: Take 20 mg by mouth every other day as needed. )  . lisinopril (PRINIVIL,ZESTRIL) 2.5 MG tablet TAKE 1 TABLET BY MOUTH ONCE DAILY  . Magnesium 100 MG TABS Take 1 tablet by mouth 2 (two) times daily.  . Misc Natural Products (COMPLETE PROSTATE HEALTH) TABS Take by mouth.  . Potassium 99 MG TABS Take 99 mg by mouth daily.   . tamsulosin (FLOMAX) 0.4 MG CAPS capsule Take 1 capsule (0.4 mg total) by mouth daily.  . vitamin E 400 UNIT capsule Take 400 Units by mouth 3 (three) times a week.   No  Known Allergies Past Medical History:  Diagnosis Date  . CAD (coronary artery disease)   . CHF (congestive heart failure) ([place])   . Frequent unifocal PVCs    Palpitations since early 20s, PVCs and pairs  . Hyperlipidemia   . Hypertension   . Stroke [facility]) 2009   Family History  Problem Relation Age of Onset  . Stroke Mother   . Early death Mother   . Cancer Brother    Past Surgical History:  Procedure Laterality Date  . CARDIAC CATHETERIZATION N/A 01/23/2016   Procedure: Right/Left Heart Cath and Coronary Angiography;  Surgeon: Burnell Blanks, MD;  Location: [place] [facility];  Service: Cardiovascular;  Laterality: N/A;  . CARDIAC CATHETERIZATION N/A 01/23/2016   Procedure: Coronary Stent Intervention;  Surgeon: Burnell Blanks, MD;  Location: [place] [facility];  Service: Cardiovascular;  Laterality: N/A;  . HERNIA REPAIR  06/1992   Social History   Socioeconomic History  . Marital status: Married    Spouse name: Not on file  . Number of children: Not on file  . Years of education: Not on file  . Highest education level: Not on file  Social Needs  . Financial resource strain: Not on file  . Food insecurity - worry: Not on file  . Food insecurity - inability: Not on file  . Transportation needs - medical: Not on file  . Transportation needs - non-medical: Not on file  Occupational History  . Not on file  Tobacco Use  . Smoking status: Never Smoker  . Smokeless tobacco: Never Used  Substance and Sexual Activity  . Alcohol use: No  . Drug use: No  . Sexual activity: Not Currently  Other Topics Concern  . Not on file  Social History Narrative  . Not on file     Review of Systems: General: negative for chills, fever, night sweats or weight changes.  Cardiovascular: negative for chest pain, dyspnea on exertion, edema, orthopnea, palpitations, paroxysmal nocturnal dyspnea or shortness of breath Dermatological: negative for rash Respiratory: negative for  cough or wheezing Urologic: negative for hematuria Abdominal: negative for nausea, vomiting, diarrhea, bright red blood per rectum, melena, or hematemesis Neurologic: negative for visual changes, syncope, or dizziness All other systems reviewed and are otherwise negative except as noted above.   Physical Exam:  Blood pressure 134/82, pulse (!) 101, height 5\' 9"  (1.753 m), weight 191 lb (86.6 kg).  General appearance: alert, cooperative and no distress Neck: no carotid bruit and no JVD Lungs: clear to auscultation bilaterally Heart: regular rate and rhythm Extremities: extremities normal, atraumatic, no cyanosis or edema Pulses: 2+ and symmetric Skin: Skin color, texture, turgor normal. No rashes or lesions Neurologic: Grossly normal  EKG EKG 11/09/17 sinus tach 107 with frequent PVCs -- personally reviewed   ASSESSMENT AND PLAN:   1. PVCs: Pt noted to have frequent PVCs at  PCP office earlier this week. PCP checked labs 11/09/17. TSH, CBC and BMP all normal. He was previously on BB therapy but this was discontinued due to low pulse rate detected on home BP monitor. Low pulse rates at home likely 2/2 to frequent PVCs, thus low pulse rate is inaccurate. I think that he should go back on BB therapy. We should also r/o other possible arrhthymias such as afib/flutter. I have recommended outpatient monitor to better assess. This can also calculate PVC burden. Will arrange 48 hr outpatient monitor. Will also get repeat 2D echo to reassess LVEF given frequent PVCs and fatigue.   2. CAD: h/o coronary stenting to LAD and OM1 in 2017. Stable w/o CP or dyspnea. Continue medical therapy.   3. Chronic Combined Systolic and Diastolic HF: EF previously 30-35% at time of PCI in 12/2015>>> improved to 40-45% on repeat cath 04/2016. Coreg was d/c due to low pulse readings. He remains on lisinopril. Volume stable. No dyspnea. He takes Lasix PRN based on weight gain/ edema. Will repeat echo given increased PVCs and  fatigue. Continue ACE-I. Will likely restart BB, but would like to calculate PVC burden prior to start of treatment.   4. HTN: controlled on current regimen.   Follow-Up:  F/u after monitor and echo.   Jonahtan Manseau Ladoris Gene, MHS [facility] 11/13/2017 12:33 PM

## 2017-11-17 ENCOUNTER — Telehealth: Payer: Self-pay | Admitting: Cardiology

## 2017-11-17 NOTE — Telephone Encounter (Signed)
New message  Patrick Maxwell verbalized that she is calling   The family called because there are questions pertaining to two   Upcoming diagnostic test and she want to make sure the prior authorizations are done   For an Echo

## 2017-11-17 NOTE — Telephone Encounter (Signed)
Spoke w/pt's wife.  No prior authorization required for echo or monitor.

## 2017-11-18 ENCOUNTER — Other Ambulatory Visit: Payer: Self-pay

## 2017-11-18 ENCOUNTER — Ambulatory Visit (HOSPITAL_COMMUNITY): Payer: PPO | Attending: Cardiovascular Disease

## 2017-11-18 DIAGNOSIS — Z8673 Personal history of transient ischemic attack (TIA), and cerebral infarction without residual deficits: Secondary | ICD-10-CM | POA: Insufficient documentation

## 2017-11-18 DIAGNOSIS — I5042 Chronic combined systolic (congestive) and diastolic (congestive) heart failure: Secondary | ICD-10-CM | POA: Diagnosis not present

## 2017-11-18 DIAGNOSIS — E785 Hyperlipidemia, unspecified: Secondary | ICD-10-CM | POA: Diagnosis not present

## 2017-11-18 DIAGNOSIS — I110 Hypertensive heart disease with heart failure: Secondary | ICD-10-CM | POA: Insufficient documentation

## 2017-11-18 DIAGNOSIS — I340 Nonrheumatic mitral (valve) insufficiency: Secondary | ICD-10-CM | POA: Diagnosis not present

## 2017-11-18 DIAGNOSIS — I493 Ventricular premature depolarization: Secondary | ICD-10-CM

## 2017-11-18 MED ORDER — PERFLUTREN LIPID MICROSPHERE
1.0000 mL | INTRAVENOUS | Status: AC | PRN
  Administered 2017-11-18: 2 mL via INTRAVENOUS

## 2017-11-24 ENCOUNTER — Other Ambulatory Visit: Payer: Self-pay | Admitting: Family Medicine

## 2017-11-25 ENCOUNTER — Ambulatory Visit (INDEPENDENT_AMBULATORY_CARE_PROVIDER_SITE_OTHER): Payer: PPO

## 2017-11-25 ENCOUNTER — Telehealth: Payer: Self-pay | Admitting: *Deleted

## 2017-11-25 DIAGNOSIS — I493 Ventricular premature depolarization: Secondary | ICD-10-CM

## 2017-11-25 DIAGNOSIS — I5042 Chronic combined systolic (congestive) and diastolic (congestive) heart failure: Secondary | ICD-10-CM | POA: Diagnosis not present

## 2017-11-25 DIAGNOSIS — N418 Other inflammatory diseases of prostate: Secondary | ICD-10-CM

## 2017-11-25 DIAGNOSIS — N400 Enlarged prostate without lower urinary tract symptoms: Secondary | ICD-10-CM

## 2017-11-25 DIAGNOSIS — R972 Elevated prostate specific antigen [PSA]: Principal | ICD-10-CM

## 2017-11-25 NOTE — Telephone Encounter (Signed)
Received call from patient wife, Mariann Laster.   Reports that she and patient have decided to go to urology and is requesting referral for Dr. Kathie Rhodes at [facility].   Ok to place referral.

## 2017-11-25 NOTE — Telephone Encounter (Signed)
OKAY to place urology referral

## 2017-11-26 NOTE — Telephone Encounter (Signed)
Referral orders placed

## 2017-12-01 ENCOUNTER — Other Ambulatory Visit: Payer: Self-pay

## 2017-12-01 MED ORDER — CARVEDILOL 3.125 MG PO TABS: 3.1250 mg | ORAL_TABLET | Freq: Two times a day (BID) | ORAL | 3 refills | Status: DC

## 2017-12-03 ENCOUNTER — Ambulatory Visit: Payer: PPO | Admitting: Cardiology

## 2017-12-03 ENCOUNTER — Encounter: Payer: Self-pay | Admitting: Cardiology

## 2017-12-03 VITALS — BP 124/64 | HR 71 | Resp 96 | Ht 69.0 in | Wt 196.0 lb

## 2017-12-03 DIAGNOSIS — E782 Mixed hyperlipidemia: Secondary | ICD-10-CM | POA: Diagnosis not present

## 2017-12-03 DIAGNOSIS — I493 Ventricular premature depolarization: Secondary | ICD-10-CM

## 2017-12-03 MED ORDER — LOSARTAN POTASSIUM 25 MG PO TABS: 25.0000 mg | ORAL_TABLET | Freq: Every day | ORAL | 3 refills | Status: DC

## 2017-12-03 MED ORDER — ATORVASTATIN CALCIUM 20 MG PO TABS: 20.0000 mg | ORAL_TABLET | Freq: Every day | ORAL | 3 refills | Status: DC

## 2017-12-03 MED ORDER — LOSARTAN POTASSIUM 25 MG PO TABS: 12.5000 mg | ORAL_TABLET | Freq: Every day | ORAL | 3 refills | Status: DC

## 2017-12-03 NOTE — Patient Instructions (Addendum)
Medication Instructions:  Your physician has recommended you make the following change in your medication:  1-STOP Lisinopril 2-START Losartan 12.5 mg (1/2 tablet) by mouth daily. If you blood pressure is running higher than 130/80, you will need to increase your dose to 25 mg by mouth daily. Please call our office if you need to take the increased dose.  3-START Lipitor 20 mg by mouth daily.  Labwork: Your physician recommends that you return for lab work on 01/21/18 for fasting lipid and liver panel.  Testing/Procedures: NONE  Follow-Up: Your physician wants you to follow-up with Dr. Angelena Form as already scheduled.   If you need a refill on your cardiac medications before your next appointment, please call your pharmacy.  Please monitor your blood pressures. We want your blood pressures to be below 130/80

## 2017-12-03 NOTE — Progress Notes (Signed)
12/03/2017 Patrick Maxwell   September 16, 1931  [id number]  Primary Physician Patrick Manis Modena Nunnery, MD Primary Cardiologist: Dr. Angelena Maxwell   Reason for Visit/CC: PVCs  HPI:   Patrick Crescenzo Lineberryis a 84 y.o.malefollowed by Dr. Angelena Maxwell, with h/o CAD, iscehmic cardiomyopathy/ chronic systolic HF, HTN, HLD, CVA and PVCs. In May 2017, he had a LHC whichshowed a severe mid LAD stenosis treated with a drug eluting stent and a severe stenosis in the obtuse marginal artery treated with a drug eluting stent. Echo May 2017 with LVEF=30-35% prior to his PCI. After the PCI, Echo in August 2017 showed improvement in LVEF to 40-45%. Also noted to have mild mitral valve regurgitation.He was seen by Dr. Angelena Maxwell on 01/16/17. He was doing well w/o issues.He had issues with bradycardia, thus his Coreg was discontinued.  He presents to clinic today given increased PVCs. He was seen recently by his PCP about 1 month ago and complained of a recent episode of dizziness. His PCP checked an EKG 11/09/17 that showed sinus tachycardia 107 bmp and frequent PVCs.  As outlined above his carvedilol was previously discontinued due to low pulse rates on home BP monitor. His PCP checked TSH 11/09/17 and was normal. CBC and BMP also normal.    When I saw him on 11/13/17, I ordered a 48 hr cardiac monitor to assess PVC burden as well as an echocardiogram to reassess LVEF. Monitor confirmed frequent PVCs. Dr. Angelena Maxwell reviewed report and recommended he restart Coreg 3.125 mg BID. Echo showed reduced LVEF at 35-40%. This is likely tachy medicated from frequent PVCs.   Today in clinic, he reports that he feels better. He is compliant with Coreg. Tolerating well w/o side effects. RRR on exam. No irregularity noted. HR is in the low 70s. No dyspnea, chest pain, LEE, orthopnea or PND. He has been compliant with Lasix. Weight has remained stable. His home dry weight is ~192 lb. His only complaint is ear ringing and HA after taking his lisinopril.  He notes that this has been an ongoing issue since starting it. He is also not currently on a statin. Lipid panel 10/2017 showed LDL at 114 mg/dL. He has been attempting to control through diet and exercise.   Cardiac Studies  48 hr Monitor Study Highlights 11/25/17  Sinus rhythm Frequent Premature ventricular contractions, ventricular bigeminy with couplets and triplets Frequent Premature atrial contractions.    __________________________ 2D Echo 11/18/17 Study Conclusions  - Left ventricle: The cavity size was normal. Wall thickness was   normal. Systolic function was moderately reduced. The estimated   ejection fraction was in the range of 35% to 40%. - Mitral valve: There was mild regurgitation. - Left atrium: The atrium was moderately dilated. - Right ventricle: The cavity size was mildly dilated. Systolic   function was mildly reduced. - Right atrium: The atrium was mildly dilated. _____________________________ [facility] 01/2016 Conclusion    Mid LAD lesion, 99% stenosed.  Mid LAD to Dist LAD lesion, 20% stenosed.  Mid RCA to Dist RCA lesion, 20% stenosed.  RPDA lesion, 20% stenosed.  Ost LAD to Prox LAD lesion, 50% stenosed. Post intervention, there is a 0% residual stenosis.  2nd Mrg lesion, 80% stenosed. Post intervention, there is a 0% residual stenosis.   1. Severe double vessel CAD 2. Unstable angina.  3. Ischemic cardiomyopathy 4. Severe stenosis mid LAD. Now s/p successful PTCA/DES x 1 proximal and mid LAD 5. Severe stenosis mid obtuse marginal. Now s/p successful PTCA/DES x 1 OM1.  Current Meds  Medication Sig  . Ascorbic Acid (VITAMIN C PO) Take 500 mg by mouth 3 (three) times a week.   Marland Kitchen aspirin 81 MG tablet Take 81 mg by mouth every other day.  . carvedilol (COREG) 3.125 MG tablet Take 1 tablet (3.125 mg total) by mouth 2 (two) times daily.  . Cholecalciferol (VITAMIN D PO) Take 5,000 Units by mouth daily.  . Coenzyme Q10 (COQ10) 100 MG CAPS Take 1  capsule by mouth daily.  . furosemide (LASIX) 20 MG tablet TAKE ONE TABLET BY MOUTH ONCE DAILY  . Magnesium 100 MG TABS Take 1 tablet by mouth 2 (two) times daily.  . Misc Natural Products (COMPLETE PROSTATE HEALTH) TABS Take by mouth.  . Potassium 99 MG TABS Take 99 mg by mouth daily.   . tamsulosin (FLOMAX) 0.4 MG CAPS capsule Take 1 capsule (0.4 mg total) by mouth daily.  . vitamin E 400 UNIT capsule Take 400 Units by mouth 3 (three) times a week.  . [DISCONTINUED] lisinopril (PRINIVIL,ZESTRIL) 2.5 MG tablet TAKE 1 TABLET BY MOUTH ONCE DAILY   No Known Allergies Past Medical History:  Diagnosis Date  . CAD (coronary artery disease)   . CHF (congestive heart failure) ([place])   . Frequent unifocal PVCs    Palpitations since early 20s, PVCs and pairs  . Hyperlipidemia   . Hypertension   . Stroke [name]) 2009   Family History  Problem Relation Age of Onset  . Stroke Mother   . Early death Mother   . Cancer Brother    Past Surgical History:  Procedure Laterality Date  . CARDIAC CATHETERIZATION N/A 01/23/2016   Procedure: Right/Left Heart Cath and Coronary Angiography;  Surgeon: Patrick Blanks, MD;  Location: [place] [facility];  Service: Cardiovascular;  Laterality: N/A;  . CARDIAC CATHETERIZATION N/A 01/23/2016   Procedure: Coronary Stent Intervention;  Surgeon: Patrick Blanks, MD;  Location: [place] [facility];  Service: Cardiovascular;  Laterality: N/A;  . HERNIA REPAIR  06/1992   Social History   Socioeconomic History  . Marital status: Married    Spouse name: Not on file  . Number of children: Not on file  . Years of education: Not on file  . Highest education level: Not on file  Social Needs  . Financial resource strain: Not on file  . Food insecurity - worry: Not on file  . Food insecurity - inability: Not on file  . Transportation needs - medical: Not on file  . Transportation needs - non-medical: Not on file  Occupational History  . Not on file    Tobacco Use  . Smoking status: Never Smoker  . Smokeless tobacco: Never Used  Substance and Sexual Activity  . Alcohol use: No  . Drug use: No  . Sexual activity: Not Currently  Other Topics Concern  . Not on file  Social History Narrative  . Not on file     Review of Systems: General: negative for chills, fever, night sweats or weight changes.  Cardiovascular: negative for chest pain, dyspnea on exertion, edema, orthopnea, palpitations, paroxysmal nocturnal dyspnea or shortness of breath Dermatological: negative for rash Respiratory: negative for cough or wheezing Urologic: negative for hematuria Abdominal: negative for nausea, vomiting, diarrhea, bright red blood per rectum, melena, or hematemesis Neurologic: negative for visual changes, syncope, or dizziness All other systems reviewed and are otherwise negative except as noted above.   Physical Exam:  Blood pressure 124/64, pulse 71, resp. rate (!) 96, height 5'  9" (1.753 m), weight 196 lb (88.9 kg).  General appearance: alert, cooperative and no distress Neck: no carotid bruit and no JVD Lungs: clear to auscultation bilaterally Heart: regular rate and rhythm, S1, S2 normal, no murmur, click, rub or gallop Extremities: extremities normal, atraumatic, no cyanosis or edema Pulses: 2+ and symmetric Skin: Skin color, texture, turgor normal. No rashes or lesions Neurologic: Grossly normal  EKG not performed -- personally reviewed   ASSESSMENT AND PLAN:   1. PVCs: Frequent Premature ventricular contractions, ventricular bigeminy with couplets and triplets and frequent Premature atrial contractions, noted on recent 48 hr monitor. Recent TSH and BMP WNL. Pt restarted on low dose Coreg and seems to be tolerating well w/o side effects. RRR on exam today. Pt notes feeling better. EF slightly reduced on echo, compared to previous, down from 40-45% to 35-40%, likely tachy mediated from frequent ectopy. Continue BB therapy.   2.  Cardiomyopathy/Chroninc Combined Systolic + Diastolic HF:  EF slightly reduced on echo, compared to previous, down from 40-45% to 35-40%, likely tachymediated from frequent ectopy. BB therapy initiated and tolerating well. Will continue on Coreg. He has been on low dose lisinopril but notes side effects, as outlined above. Will change to Losartan, 12.5 mg. Titrate up as BP allows. His volume appears stable. Euvolemic on exam. Home weights have consistently stayed in the 192 lb range. Continue Lasix. We discussed daily weights and low salt diet. He has f/u with Dr. Angelena Maxwell in May, who can decide if pt should get repeat echo.   3. CAD: h/o coronary stenting to LAD and OM1 in 2017. Stable w/o CP or dyspnea. Continue medical therapy.   4. HLD: currently not on statin therapy given side effects. He was previously on Lipitor 80 mg but self discontinued due to arthralgias. He has known CAD s/p LAD and OM1 stenting in 2017. Recent lipid panel showed elevated LDL at 114 mg/DL. Goal in the setting of CAD is < 70 mg/dL. We discussed retrial of Lipitor at low dose. Will restart at 20 mg nightly. Check repeat FLP and HFTs when he returns in May.   5. HTN: controlled on current regimen. Will change from Lisinopril to Losartan, at equivalent dose, for reasons outlined above. He will monitor BP at home and if > 130/80, after transition, he will call for recs. Can increase to full 25 mg tablet if needed.    Follow-Up: Keep F/u with Dr. Angelena Maxwell in May.   Brittainy Ladoris Gene, MHS [facility] 12/03/2017 12:26 PM

## 2017-12-07 ENCOUNTER — Other Ambulatory Visit: Payer: Self-pay

## 2017-12-07 ENCOUNTER — Encounter: Payer: Self-pay | Admitting: Family Medicine

## 2017-12-07 ENCOUNTER — Ambulatory Visit
Admission: RE | Admit: 2017-12-07 | Discharge: 2017-12-07 | Disposition: A | Discharge status: Home / Self Care | Payer: PPO | Source: Ambulatory Visit | Attending: Family Medicine | Admitting: Family Medicine

## 2017-12-07 ENCOUNTER — Ambulatory Visit (INDEPENDENT_AMBULATORY_CARE_PROVIDER_SITE_OTHER): Payer: PPO | Admitting: Family Medicine

## 2017-12-07 VITALS — BP 122/62 | HR 64 | Temp 98.1°F | Resp 14 | Ht 69.0 in | Wt 199.0 lb

## 2017-12-07 DIAGNOSIS — R0600 Dyspnea, unspecified: Secondary | ICD-10-CM | POA: Diagnosis not present

## 2017-12-07 DIAGNOSIS — I5022 Chronic systolic (congestive) heart failure: Secondary | ICD-10-CM

## 2017-12-07 DIAGNOSIS — R05 Cough: Secondary | ICD-10-CM | POA: Diagnosis not present

## 2017-12-07 MED ORDER — BENZONATATE 100 MG PO CAPS: 100.0000 mg | ORAL_CAPSULE | Freq: Three times a day (TID) | ORAL | 0 refills | Status: DC | PRN

## 2017-12-07 NOTE — Assessment & Plan Note (Signed)
CXR did not show effusion, or PNA Will give tessalon perrles Increase lasix to 40mg  for 5 day, recheck on Friday

## 2017-12-07 NOTE — Patient Instructions (Addendum)
[place] for xray  F/U pending results

## 2017-12-07 NOTE — Progress Notes (Signed)
   Subjective:    Patient ID: Patrick Maxwell, male    DOB: 11-05-1930, 84 y.o.   MRN: [id number]  Patient presents for SOB (x3 weeks- sob especially at night when lying down- also thinks he may have PNA) Patient here with shortness of breath for the past 3 weeks.  States that he had a viral illness 3 weeks ago he did not have any fever but cough with congestion.  He now feels like he cannot take a deep breath it is worse at night he has a proximal himself up.  He denies any chest pain.  He has heard some wheezing at nighttime 2.  He still gets some cough with congestion.  He has not had any fever or chills.  No known sick contacts.  He was taking some cough medicine is no longer doing this.  He does have history of CHF he is taking his Lasix daily as well as his other cardiac medications.    Review Of Systems:  GEN- denies fatigue, fever, weight loss,weakness, recent illness HEENT- denies eye drainage, change in vision, nasal discharge, CVS- denies chest pain, palpitations RESP- + SOB, +cough,+ wheeze ABD- denies N/V, change in stools, abd pain GU- denies dysuria, hematuria, dribbling, incontinence MSK- denies joint pain, muscle aches, injury Neuro- denies headache, dizziness, syncope, seizure activity       Objective:    BP 122/62   Pulse 64   Temp 98.1 F (36.7 C) (Oral)   Resp 14   Ht 5\' 9"  (1.753 m)   Wt 199 lb (90.3 kg)   SpO2 96%   BMI 29.39 kg/m  GEN- NAD, alert and oriented x3 HEENT- PERRL, EOMI, non injected sclera, pink conjunctiva, MMM, oropharynx clear Neck- Supple, no JVD CVS-RR, mild bradycardia, no murmur RESP-Crackles Left base > Right, no wheeze , no retractions, normal breathing at rest  ABD-NABS,soft,NT,ND EXT- pedal edema Pulses- Radial  2+        Assessment & Plan:      Problem List Items Addressed This Visit      Unprioritized   CHF (congestive heart failure) ([place])    CXR did not show effusion, or PNA Will give tessalon perrles Increase  lasix to 40mg  for 5 day, recheck on Friday        Other Visit Diagnoses    Dyspnea, unspecified type    -  Primary   DD include post viral PNA vs CHF exacerbation, his oxygen sat is normal, weight is up 3lbs in past few weeks as well. Obtain CXR STAT   Relevant Orders   DG Chest 2 View (Completed)      Note: This dictation was prepared with Dragon dictation along with smaller phrase technology. Any transcriptional errors that result from this process are unintentional.

## 2017-12-10 ENCOUNTER — Telehealth: Payer: Self-pay | Admitting: Cardiovascular Disease

## 2017-12-10 NOTE — Telephone Encounter (Signed)
New Message:    Pt's wife needs to give and update on some things and she has some concerns but he is okay.

## 2017-12-10 NOTE — Telephone Encounter (Signed)
Spoke with patient's wife (DPR) in regards to patient's leg swelling on 3/18.Marland Kitchen   His PCP increased his Lasix to 20 mg bid for 5 days and they will follow up with her 3/22.Marland Kitchen  His weight has decreased over the past few days with the increased dosage.Marland Kitchen

## 2017-12-11 ENCOUNTER — Ambulatory Visit (INDEPENDENT_AMBULATORY_CARE_PROVIDER_SITE_OTHER): Payer: PPO | Admitting: Family Medicine

## 2017-12-11 ENCOUNTER — Encounter: Payer: Self-pay | Admitting: Family Medicine

## 2017-12-11 ENCOUNTER — Other Ambulatory Visit: Payer: Self-pay

## 2017-12-11 VITALS — BP 134/70 | HR 82 | Temp 98.1°F | Resp 14 | Ht 69.0 in | Wt 193.0 lb

## 2017-12-11 DIAGNOSIS — I5022 Chronic systolic (congestive) heart failure: Secondary | ICD-10-CM

## 2017-12-11 LAB — BASIC METABOLIC PANEL
BUN/Creatinine Ratio: 15 (calc) (ref 6–22)
BUN: 19 mg/dL (ref 7–25)
CHLORIDE: 104 mmol/L (ref 98–110)
CO2: 24 mmol/L (ref 20–32)
CREATININE: 1.27 mg/dL — AB (ref 0.70–1.11)
Calcium: 9.1 mg/dL (ref 8.6–10.3)
GLUCOSE: 92 mg/dL (ref 65–99)
Potassium: 4.5 mmol/L (ref 3.5–5.3)
Sodium: 138 mmol/L (ref 135–146)

## 2017-12-11 NOTE — Assessment & Plan Note (Signed)
Improved symptoms with removal of fluid.  His weight this morning was 190 pounds.  Advised to continue weighing himself that he knows that he is going up he should double his Lasix like we did before and call either myself or the cardiologist.  He is to resume his daily Lasix for now.  I am to check his renal function as he has increased to a higher dose to make sure no significant renal failure.

## 2017-12-11 NOTE — Progress Notes (Signed)
   Subjective:    Patient ID: Patrick Maxwell, male    DOB: [birth year], 84 y.o.   MRN: [id number]  Patient presents for Follow-up (is not fasting)  Patient here to follow shortness of breath.  He has known CHF.  On Monday when he came in his weight was 199 pounds.  I did an x-ray if he had URI symptoms weeks before along with his shortness of breath.  My concern was more fluid overload.  X-ray did not show any effusion or vascular congestion no infiltrate.  Advised him to increase his Lasix to 40 mg twice a day for 5 days which he did.  His weight is now down 6 pounds.  He states his breathing is back to normal he is now only sleeping on one pillow.  He admits that he was not taking his Lasix as prescribed   Review Of Systems:  GEN- denies fatigue, fever, weight loss,weakness, recent illness HEENT- denies eye drainage, change in vision, nasal discharge, CVS- denies chest pain, palpitations RESP- denies SOB, cough, wheeze ABD- denies N/V, change in stools, abd pain Neuro- denies headache, dizziness, syncope, seizure activity       Objective:    BP 134/70   Pulse 82   Temp 98.1 F (36.7 C) (Oral)   Resp 14   Ht 5\' 9"  (1.753 m)   Wt 193 lb (87.5 kg)   SpO2 98%   BMI 28.50 kg/m  GEN- NAD, alert and oriented x3 HEENT- PERRL, EOMI, non injected sclera, pink conjunctiva, MMM, oropharynx clear Neck- no JVD CVS- RRR, no murmur, few PVC RESP-CTAB EXT- No edema Pulses- Radial, DP- 2+        Assessment & Plan:      Problem List Items Addressed This Visit      Unprioritized   CHF (congestive heart failure) ([place]) - Primary    Improved symptoms with removal of fluid.  His weight this morning was 190 pounds.  Advised to continue weighing himself that he knows that he is going up he should double his Lasix like we did before and call either myself or the cardiologist.  He is to resume his daily Lasix for now.  I am to check his renal function as he has increased to a higher dose to  make sure no significant renal failure.      Relevant Orders   Basic metabolic panel      Note: This dictation was prepared with Dragon dictation along with smaller phrase technology. Any transcriptional errors that result from this process are unintentional.

## 2017-12-11 NOTE — Patient Instructions (Signed)
F/u AS PREVIOUS  

## 2018-01-01 ENCOUNTER — Other Ambulatory Visit: Payer: Self-pay | Admitting: *Deleted

## 2018-01-01 MED ORDER — TAMSULOSIN HCL 0.4 MG PO CAPS: 0.4000 mg | ORAL_CAPSULE | Freq: Every day | ORAL | 3 refills | Status: DC

## 2018-01-11 DIAGNOSIS — H01001 Unspecified blepharitis right upper eyelid: Secondary | ICD-10-CM | POA: Diagnosis not present

## 2018-01-11 DIAGNOSIS — G43809 Other migraine, not intractable, without status migrainosus: Secondary | ICD-10-CM | POA: Diagnosis not present

## 2018-01-11 DIAGNOSIS — H40013 Open angle with borderline findings, low risk, bilateral: Secondary | ICD-10-CM | POA: Diagnosis not present

## 2018-01-11 DIAGNOSIS — H353132 Nonexudative age-related macular degeneration, bilateral, intermediate dry stage: Secondary | ICD-10-CM | POA: Diagnosis not present

## 2018-01-11 DIAGNOSIS — H35371 Puckering of macula, right eye: Secondary | ICD-10-CM | POA: Diagnosis not present

## 2018-01-11 DIAGNOSIS — H2513 Age-related nuclear cataract, bilateral: Secondary | ICD-10-CM | POA: Diagnosis not present

## 2018-01-20 ENCOUNTER — Encounter (HOSPITAL_COMMUNITY): Payer: Self-pay

## 2018-01-20 ENCOUNTER — Inpatient Hospital Stay (HOSPITAL_COMMUNITY)
Admission: EM | Admit: 2018-01-20 | Discharge: 2018-01-26 | DRG: 064 | Disposition: A | Discharge status: Rehabilitation Facility | Payer: PPO | Attending: Internal Medicine | Admitting: Internal Medicine

## 2018-01-20 DIAGNOSIS — D72829 Elevated white blood cell count, unspecified: Secondary | ICD-10-CM | POA: Diagnosis not present

## 2018-01-20 DIAGNOSIS — I25110 Atherosclerotic heart disease of native coronary artery with unstable angina pectoris: Secondary | ICD-10-CM | POA: Diagnosis present

## 2018-01-20 DIAGNOSIS — E785 Hyperlipidemia, unspecified: Secondary | ICD-10-CM | POA: Diagnosis present

## 2018-01-20 DIAGNOSIS — R7303 Prediabetes: Secondary | ICD-10-CM | POA: Diagnosis not present

## 2018-01-20 DIAGNOSIS — I5041 Acute combined systolic (congestive) and diastolic (congestive) heart failure: Secondary | ICD-10-CM | POA: Diagnosis present

## 2018-01-20 DIAGNOSIS — R269 Unspecified abnormalities of gait and mobility: Secondary | ICD-10-CM | POA: Diagnosis not present

## 2018-01-20 DIAGNOSIS — R29701 NIHSS score 1: Secondary | ICD-10-CM | POA: Diagnosis present

## 2018-01-20 DIAGNOSIS — I69393 Ataxia following cerebral infarction: Secondary | ICD-10-CM | POA: Diagnosis not present

## 2018-01-20 DIAGNOSIS — I6389 Other cerebral infarction: Secondary | ICD-10-CM | POA: Diagnosis not present

## 2018-01-20 DIAGNOSIS — N183 Chronic kidney disease, stage 3 (moderate): Secondary | ICD-10-CM | POA: Diagnosis present

## 2018-01-20 DIAGNOSIS — R001 Bradycardia, unspecified: Secondary | ICD-10-CM | POA: Diagnosis present

## 2018-01-20 DIAGNOSIS — R3915 Urgency of urination: Secondary | ICD-10-CM | POA: Diagnosis present

## 2018-01-20 DIAGNOSIS — Z6827 Body mass index (BMI) 27.0-27.9, adult: Secondary | ICD-10-CM

## 2018-01-20 DIAGNOSIS — Z955 Presence of coronary angioplasty implant and graft: Secondary | ICD-10-CM

## 2018-01-20 DIAGNOSIS — R42 Dizziness and giddiness: Secondary | ICD-10-CM | POA: Diagnosis present

## 2018-01-20 DIAGNOSIS — E7800 Pure hypercholesterolemia, unspecified: Secondary | ICD-10-CM | POA: Diagnosis present

## 2018-01-20 DIAGNOSIS — I130 Hypertensive heart and chronic kidney disease with heart failure and stage 1 through stage 4 chronic kidney disease, or unspecified chronic kidney disease: Secondary | ICD-10-CM | POA: Diagnosis present

## 2018-01-20 DIAGNOSIS — I69398 Other sequelae of cerebral infarction: Secondary | ICD-10-CM | POA: Diagnosis not present

## 2018-01-20 DIAGNOSIS — I5023 Acute on chronic systolic (congestive) heart failure: Secondary | ICD-10-CM | POA: Diagnosis not present

## 2018-01-20 DIAGNOSIS — G8194 Hemiplegia, unspecified affecting left nondominant side: Secondary | ICD-10-CM | POA: Diagnosis present

## 2018-01-20 DIAGNOSIS — I5022 Chronic systolic (congestive) heart failure: Secondary | ICD-10-CM | POA: Diagnosis not present

## 2018-01-20 DIAGNOSIS — I081 Rheumatic disorders of both mitral and tricuspid valves: Secondary | ICD-10-CM | POA: Diagnosis present

## 2018-01-20 DIAGNOSIS — R0902 Hypoxemia: Secondary | ICD-10-CM | POA: Diagnosis not present

## 2018-01-20 DIAGNOSIS — I361 Nonrheumatic tricuspid (valve) insufficiency: Secondary | ICD-10-CM | POA: Diagnosis not present

## 2018-01-20 DIAGNOSIS — I472 Ventricular tachycardia: Secondary | ICD-10-CM | POA: Diagnosis present

## 2018-01-20 DIAGNOSIS — I340 Nonrheumatic mitral (valve) insufficiency: Secondary | ICD-10-CM | POA: Diagnosis not present

## 2018-01-20 DIAGNOSIS — I700 Atherosclerosis of aorta: Secondary | ICD-10-CM | POA: Diagnosis present

## 2018-01-20 DIAGNOSIS — R002 Palpitations: Secondary | ICD-10-CM | POA: Diagnosis not present

## 2018-01-20 DIAGNOSIS — I63432 Cerebral infarction due to embolism of left posterior cerebral artery: Principal | ICD-10-CM | POA: Diagnosis present

## 2018-01-20 DIAGNOSIS — I2510 Atherosclerotic heart disease of native coronary artery without angina pectoris: Secondary | ICD-10-CM | POA: Diagnosis present

## 2018-01-20 DIAGNOSIS — I639 Cerebral infarction, unspecified: Secondary | ICD-10-CM | POA: Diagnosis not present

## 2018-01-20 DIAGNOSIS — Z7982 Long term (current) use of aspirin: Secondary | ICD-10-CM

## 2018-01-20 DIAGNOSIS — I493 Ventricular premature depolarization: Secondary | ICD-10-CM | POA: Diagnosis present

## 2018-01-20 DIAGNOSIS — I5043 Acute on chronic combined systolic (congestive) and diastolic (congestive) heart failure: Secondary | ICD-10-CM | POA: Diagnosis present

## 2018-01-20 DIAGNOSIS — N401 Enlarged prostate with lower urinary tract symptoms: Secondary | ICD-10-CM | POA: Diagnosis present

## 2018-01-20 DIAGNOSIS — I110 Hypertensive heart disease with heart failure: Secondary | ICD-10-CM | POA: Diagnosis not present

## 2018-01-20 DIAGNOSIS — Z8673 Personal history of transient ischemic attack (TIA), and cerebral infarction without residual deficits: Secondary | ICD-10-CM

## 2018-01-20 DIAGNOSIS — R740 Nonspecific elevation of levels of transaminase and lactic acid dehydrogenase [LDH]: Secondary | ICD-10-CM | POA: Diagnosis present

## 2018-01-20 DIAGNOSIS — I69354 Hemiplegia and hemiparesis following cerebral infarction affecting left non-dominant side: Secondary | ICD-10-CM | POA: Diagnosis not present

## 2018-01-20 DIAGNOSIS — K5900 Constipation, unspecified: Secondary | ICD-10-CM | POA: Diagnosis present

## 2018-01-20 DIAGNOSIS — N179 Acute kidney failure, unspecified: Secondary | ICD-10-CM | POA: Diagnosis not present

## 2018-01-20 DIAGNOSIS — I255 Ischemic cardiomyopathy: Secondary | ICD-10-CM | POA: Diagnosis present

## 2018-01-20 DIAGNOSIS — I69322 Dysarthria following cerebral infarction: Secondary | ICD-10-CM | POA: Diagnosis not present

## 2018-01-20 DIAGNOSIS — R972 Elevated prostate specific antigen [PSA]: Secondary | ICD-10-CM

## 2018-01-20 DIAGNOSIS — N182 Chronic kidney disease, stage 2 (mild): Secondary | ICD-10-CM | POA: Diagnosis not present

## 2018-01-20 DIAGNOSIS — I10 Essential (primary) hypertension: Secondary | ICD-10-CM | POA: Diagnosis present

## 2018-01-20 DIAGNOSIS — Z823 Family history of stroke: Secondary | ICD-10-CM

## 2018-01-20 DIAGNOSIS — R404 Transient alteration of awareness: Secondary | ICD-10-CM | POA: Diagnosis not present

## 2018-01-20 DIAGNOSIS — I6523 Occlusion and stenosis of bilateral carotid arteries: Secondary | ICD-10-CM | POA: Diagnosis not present

## 2018-01-20 DIAGNOSIS — R260 Ataxic gait: Secondary | ICD-10-CM | POA: Diagnosis present

## 2018-01-20 DIAGNOSIS — E669 Obesity, unspecified: Secondary | ICD-10-CM | POA: Diagnosis present

## 2018-01-20 DIAGNOSIS — Z79899 Other long term (current) drug therapy: Secondary | ICD-10-CM

## 2018-01-20 DIAGNOSIS — E039 Hypothyroidism, unspecified: Secondary | ICD-10-CM | POA: Diagnosis present

## 2018-01-20 DIAGNOSIS — R202 Paresthesia of skin: Secondary | ICD-10-CM | POA: Diagnosis present

## 2018-01-20 DIAGNOSIS — I63549 Cerebral infarction due to unspecified occlusion or stenosis of unspecified cerebellar artery: Secondary | ICD-10-CM | POA: Diagnosis not present

## 2018-01-20 DIAGNOSIS — E038 Other specified hypothyroidism: Secondary | ICD-10-CM | POA: Diagnosis present

## 2018-01-20 DIAGNOSIS — I509 Heart failure, unspecified: Secondary | ICD-10-CM | POA: Diagnosis not present

## 2018-01-20 DIAGNOSIS — Z809 Family history of malignant neoplasm, unspecified: Secondary | ICD-10-CM | POA: Diagnosis not present

## 2018-01-20 DIAGNOSIS — N400 Enlarged prostate without lower urinary tract symptoms: Secondary | ICD-10-CM | POA: Diagnosis present

## 2018-01-20 LAB — URINALYSIS, ROUTINE W REFLEX MICROSCOPIC
BACTERIA UA: NONE SEEN
BILIRUBIN URINE: NEGATIVE
GLUCOSE, UA: NEGATIVE mg/dL
Ketones, ur: NEGATIVE mg/dL
LEUKOCYTES UA: NEGATIVE
Nitrite: NEGATIVE
PROTEIN: 30 mg/dL — AB
Specific Gravity, Urine: 1.019 (ref 1.005–1.030)
pH: 5 (ref 5.0–8.0)

## 2018-01-20 LAB — CBG MONITORING, ED: GLUCOSE-CAPILLARY: 170 mg/dL — AB (ref 65–99)

## 2018-01-20 NOTE — ED Triage Notes (Signed)
Pt comes via [facility] for dizziness and near syncopal episode today where he lower himself today, pt in bigeminy. Denies CP

## 2018-01-20 NOTE — ED Notes (Signed)
I checked to see if the family would be needed during triage.  I asked (no RN or NTs around at the moment) the patient if he was able to communicate well = understand the RN, the questions being asked of him.  He said "yes, when am I getting a bed?" When he was done with triaging, I brought him to the family.  They were angry and asking me questions about his care.  Stating that I lied about what he said to me.  They kept saying he is sick and needs a room.

## 2018-01-21 ENCOUNTER — Ambulatory Visit: Payer: PPO | Admitting: Cardiovascular Disease

## 2018-01-21 ENCOUNTER — Other Ambulatory Visit: Payer: Self-pay

## 2018-01-21 ENCOUNTER — Inpatient Hospital Stay (HOSPITAL_COMMUNITY): Payer: PPO

## 2018-01-21 ENCOUNTER — Emergency Department (HOSPITAL_COMMUNITY): Payer: PPO

## 2018-01-21 ENCOUNTER — Other Ambulatory Visit: Payer: PPO

## 2018-01-21 ENCOUNTER — Telehealth: Payer: Self-pay | Admitting: Internal Medicine

## 2018-01-21 ENCOUNTER — Telehealth: Payer: Self-pay | Admitting: Cardiovascular Disease

## 2018-01-21 ENCOUNTER — Encounter (HOSPITAL_COMMUNITY): Payer: Self-pay | Admitting: General Practice

## 2018-01-21 DIAGNOSIS — I639 Cerebral infarction, unspecified: Secondary | ICD-10-CM | POA: Diagnosis not present

## 2018-01-21 DIAGNOSIS — E039 Hypothyroidism, unspecified: Secondary | ICD-10-CM | POA: Diagnosis present

## 2018-01-21 DIAGNOSIS — I130 Hypertensive heart and chronic kidney disease with heart failure and stage 1 through stage 4 chronic kidney disease, or unspecified chronic kidney disease: Secondary | ICD-10-CM | POA: Diagnosis present

## 2018-01-21 DIAGNOSIS — Z8673 Personal history of transient ischemic attack (TIA), and cerebral infarction without residual deficits: Secondary | ICD-10-CM | POA: Diagnosis not present

## 2018-01-21 DIAGNOSIS — I63432 Cerebral infarction due to embolism of left posterior cerebral artery: Secondary | ICD-10-CM | POA: Diagnosis present

## 2018-01-21 DIAGNOSIS — I69322 Dysarthria following cerebral infarction: Secondary | ICD-10-CM | POA: Diagnosis not present

## 2018-01-21 DIAGNOSIS — E785 Hyperlipidemia, unspecified: Secondary | ICD-10-CM | POA: Diagnosis present

## 2018-01-21 DIAGNOSIS — R001 Bradycardia, unspecified: Secondary | ICD-10-CM | POA: Diagnosis not present

## 2018-01-21 DIAGNOSIS — I472 Ventricular tachycardia: Secondary | ICD-10-CM | POA: Diagnosis present

## 2018-01-21 DIAGNOSIS — R260 Ataxic gait: Secondary | ICD-10-CM | POA: Diagnosis present

## 2018-01-21 DIAGNOSIS — R42 Dizziness and giddiness: Secondary | ICD-10-CM | POA: Diagnosis present

## 2018-01-21 DIAGNOSIS — N182 Chronic kidney disease, stage 2 (mild): Secondary | ICD-10-CM | POA: Diagnosis not present

## 2018-01-21 DIAGNOSIS — I69393 Ataxia following cerebral infarction: Secondary | ICD-10-CM | POA: Diagnosis not present

## 2018-01-21 DIAGNOSIS — I493 Ventricular premature depolarization: Secondary | ICD-10-CM | POA: Diagnosis present

## 2018-01-21 DIAGNOSIS — R202 Paresthesia of skin: Secondary | ICD-10-CM | POA: Diagnosis present

## 2018-01-21 DIAGNOSIS — R29701 NIHSS score 1: Secondary | ICD-10-CM | POA: Diagnosis present

## 2018-01-21 DIAGNOSIS — I10 Essential (primary) hypertension: Secondary | ICD-10-CM | POA: Diagnosis not present

## 2018-01-21 DIAGNOSIS — R269 Unspecified abnormalities of gait and mobility: Secondary | ICD-10-CM | POA: Diagnosis not present

## 2018-01-21 DIAGNOSIS — I25110 Atherosclerotic heart disease of native coronary artery with unstable angina pectoris: Secondary | ICD-10-CM | POA: Diagnosis present

## 2018-01-21 DIAGNOSIS — R3915 Urgency of urination: Secondary | ICD-10-CM | POA: Diagnosis present

## 2018-01-21 DIAGNOSIS — I69398 Other sequelae of cerebral infarction: Secondary | ICD-10-CM | POA: Diagnosis not present

## 2018-01-21 DIAGNOSIS — R7303 Prediabetes: Secondary | ICD-10-CM | POA: Diagnosis not present

## 2018-01-21 DIAGNOSIS — I361 Nonrheumatic tricuspid (valve) insufficiency: Secondary | ICD-10-CM | POA: Diagnosis not present

## 2018-01-21 DIAGNOSIS — N183 Chronic kidney disease, stage 3 (moderate): Secondary | ICD-10-CM | POA: Diagnosis present

## 2018-01-21 DIAGNOSIS — G8194 Hemiplegia, unspecified affecting left nondominant side: Secondary | ICD-10-CM | POA: Diagnosis present

## 2018-01-21 DIAGNOSIS — I340 Nonrheumatic mitral (valve) insufficiency: Secondary | ICD-10-CM | POA: Diagnosis not present

## 2018-01-21 DIAGNOSIS — R002 Palpitations: Secondary | ICD-10-CM | POA: Diagnosis not present

## 2018-01-21 DIAGNOSIS — N179 Acute kidney failure, unspecified: Secondary | ICD-10-CM | POA: Diagnosis not present

## 2018-01-21 DIAGNOSIS — E7800 Pure hypercholesterolemia, unspecified: Secondary | ICD-10-CM | POA: Diagnosis not present

## 2018-01-21 DIAGNOSIS — I63549 Cerebral infarction due to unspecified occlusion or stenosis of unspecified cerebellar artery: Secondary | ICD-10-CM | POA: Diagnosis not present

## 2018-01-21 DIAGNOSIS — D72829 Elevated white blood cell count, unspecified: Secondary | ICD-10-CM | POA: Diagnosis not present

## 2018-01-21 DIAGNOSIS — N401 Enlarged prostate with lower urinary tract symptoms: Secondary | ICD-10-CM | POA: Diagnosis present

## 2018-01-21 DIAGNOSIS — I081 Rheumatic disorders of both mitral and tricuspid valves: Secondary | ICD-10-CM | POA: Diagnosis present

## 2018-01-21 DIAGNOSIS — I700 Atherosclerosis of aorta: Secondary | ICD-10-CM | POA: Diagnosis present

## 2018-01-21 DIAGNOSIS — I5041 Acute combined systolic (congestive) and diastolic (congestive) heart failure: Secondary | ICD-10-CM | POA: Diagnosis not present

## 2018-01-21 DIAGNOSIS — K5900 Constipation, unspecified: Secondary | ICD-10-CM | POA: Diagnosis present

## 2018-01-21 DIAGNOSIS — I5043 Acute on chronic combined systolic (congestive) and diastolic (congestive) heart failure: Secondary | ICD-10-CM | POA: Diagnosis present

## 2018-01-21 DIAGNOSIS — I5023 Acute on chronic systolic (congestive) heart failure: Secondary | ICD-10-CM | POA: Diagnosis not present

## 2018-01-21 DIAGNOSIS — I255 Ischemic cardiomyopathy: Secondary | ICD-10-CM | POA: Diagnosis present

## 2018-01-21 LAB — BASIC METABOLIC PANEL
Anion gap: 10 (ref 5–15)
BUN: 22 mg/dL — ABNORMAL HIGH (ref 6–20)
CHLORIDE: 108 mmol/L (ref 101–111)
CO2: 21 mmol/L — ABNORMAL LOW (ref 22–32)
CREATININE: 1.29 mg/dL — AB (ref 0.61–1.24)
Calcium: 8.9 mg/dL (ref 8.9–10.3)
GFR, EST AFRICAN AMERICAN: 57 mL/min — AB (ref >60)
GFR, EST NON AFRICAN AMERICAN: 49 mL/min — AB (ref >60)
Glucose, Bld: 215 mg/dL — ABNORMAL HIGH (ref 65–99)
Potassium: 4.1 mmol/L (ref 3.5–5.1)
SODIUM: 139 mmol/L (ref 135–145)

## 2018-01-21 LAB — CBC
HCT: 43.4 % (ref 39.0–52.0)
Hemoglobin: 14.5 g/dL (ref 13.0–17.0)
MCH: 29 pg (ref 26.0–34.0)
MCHC: 33.4 g/dL (ref 30.0–36.0)
MCV: 86.8 fL (ref 78.0–100.0)
PLATELETS: 139 10*3/uL — AB (ref 150–400)
RBC: 5 MIL/uL (ref 4.22–5.81)
RDW: 15.8 % — ABNORMAL HIGH (ref 11.5–15.5)
WBC: 11.6 10*3/uL — AB (ref 4.0–10.5)

## 2018-01-21 LAB — I-STAT TROPONIN, ED: TROPONIN I, POC: 0.01 ng/mL (ref 0.00–0.08)

## 2018-01-21 LAB — BRAIN NATRIURETIC PEPTIDE: B Natriuretic Peptide: 2431.3 pg/mL — ABNORMAL HIGH (ref 0.0–100.0)

## 2018-01-21 LAB — MAGNESIUM: MAGNESIUM: 1.9 mg/dL (ref 1.7–2.4)

## 2018-01-21 LAB — I-STAT CG4 LACTIC ACID, ED
LACTIC ACID, VENOUS: 1.07 mmol/L (ref 0.5–1.9)
Lactic Acid, Venous: 2.15 mmol/L (ref 0.5–1.9)

## 2018-01-21 LAB — ECHOCARDIOGRAM COMPLETE

## 2018-01-21 MED ORDER — MECLIZINE HCL 25 MG PO TABS
25.0000 mg | ORAL_TABLET | Freq: Once | ORAL | Status: AC
Start: 2018-01-21 — End: 2018-01-21
  Administered 2018-01-21: 25 mg via ORAL
  Filled 2018-01-21: qty 1

## 2018-01-21 MED ORDER — FUROSEMIDE 10 MG/ML IJ SOLN
20.0000 mg | Freq: Once | INTRAMUSCULAR | Status: AC
  Administered 2018-01-21: 20 mg via INTRAVENOUS
  Filled 2018-01-21: qty 2

## 2018-01-21 MED ORDER — ASPIRIN 325 MG PO TABS
325.0000 mg | ORAL_TABLET | Freq: Every day | ORAL | Status: DC
  Administered 2018-01-21 – 2018-01-22 (×2): 325 mg via ORAL
  Filled 2018-01-21 (×2): qty 1

## 2018-01-21 MED ORDER — ASPIRIN 300 MG RE SUPP: 300.0000 mg | Freq: Every day | RECTAL | Status: DC

## 2018-01-21 MED ORDER — IOPAMIDOL (ISOVUE-370) INJECTION 76%
INTRAVENOUS | Status: AC
  Filled 2018-01-21: qty 50

## 2018-01-21 MED ORDER — HEPARIN SODIUM (PORCINE) 5000 UNIT/ML IJ SOLN
5000.0000 [IU] | Freq: Three times a day (TID) | INTRAMUSCULAR | Status: DC
  Administered 2018-01-21 – 2018-01-26 (×12): 5000 [IU] via SUBCUTANEOUS
  Filled 2018-01-21 (×14): qty 1

## 2018-01-21 MED ORDER — ONDANSETRON HCL 4 MG/2ML IJ SOLN
4.0000 mg | Freq: Once | INTRAMUSCULAR | Status: AC
  Administered 2018-01-21: 4 mg via INTRAVENOUS
  Filled 2018-01-21: qty 2

## 2018-01-21 MED ORDER — FUROSEMIDE 10 MG/ML IJ SOLN
20.0000 mg | Freq: Two times a day (BID) | INTRAMUSCULAR | Status: AC
  Administered 2018-01-21 – 2018-01-22 (×2): 20 mg via INTRAVENOUS
  Filled 2018-01-21: qty 2
  Filled 2018-01-21: qty 4

## 2018-01-21 MED ORDER — CARVEDILOL 3.125 MG PO TABS
3.1250 mg | ORAL_TABLET | Freq: Two times a day (BID) | ORAL | Status: DC
  Administered 2018-01-22 – 2018-01-25 (×4): 3.125 mg via ORAL
  Filled 2018-01-21 (×7): qty 1

## 2018-01-21 MED ORDER — IOPAMIDOL (ISOVUE-370) INJECTION 76%
50.0000 mL | Freq: Once | INTRAVENOUS | Status: AC | PRN
  Administered 2018-01-21: 50 mL via INTRAVENOUS

## 2018-01-21 MED ORDER — VITAMIN D 1000 UNITS PO TABS
5000.0000 [IU] | ORAL_TABLET | Freq: Every day | ORAL | Status: DC
  Administered 2018-01-21 – 2018-01-25 (×5): 5000 [IU] via ORAL
  Filled 2018-01-21 (×5): qty 5

## 2018-01-21 MED ORDER — ACETAMINOPHEN 650 MG RE SUPP: 650.0000 mg | RECTAL | Status: DC | PRN

## 2018-01-21 MED ORDER — ACETAMINOPHEN 325 MG PO TABS: 650.0000 mg | ORAL_TABLET | ORAL | Status: DC | PRN

## 2018-01-21 MED ORDER — ACETAMINOPHEN 160 MG/5ML PO SOLN: 650.0000 mg | ORAL | Status: DC | PRN

## 2018-01-21 MED ORDER — SENNOSIDES-DOCUSATE SODIUM 8.6-50 MG PO TABS: 1.0000 | ORAL_TABLET | Freq: Every evening | ORAL | Status: DC | PRN

## 2018-01-21 MED ORDER — CLOPIDOGREL BISULFATE 75 MG PO TABS
75.0000 mg | ORAL_TABLET | Freq: Every day | ORAL | Status: DC
  Administered 2018-01-21 – 2018-01-22 (×2): 75 mg via ORAL
  Filled 2018-01-21 (×2): qty 1

## 2018-01-21 MED ORDER — HYDRALAZINE HCL 20 MG/ML IJ SOLN
5.0000 mg | Freq: Three times a day (TID) | INTRAMUSCULAR | Status: DC | PRN
  Administered 2018-01-21: 5 mg via INTRAVENOUS
  Filled 2018-01-21: qty 1

## 2018-01-21 MED ORDER — TAMSULOSIN HCL 0.4 MG PO CAPS
0.4000 mg | ORAL_CAPSULE | Freq: Every day | ORAL | Status: DC
  Administered 2018-01-21 – 2018-01-25 (×5): 0.4 mg via ORAL
  Filled 2018-01-21 (×5): qty 1

## 2018-01-21 MED ORDER — ATORVASTATIN CALCIUM 80 MG PO TABS
80.0000 mg | ORAL_TABLET | Freq: Every day | ORAL | Status: DC
  Administered 2018-01-21 – 2018-01-22 (×2): 80 mg via ORAL
  Filled 2018-01-21 (×3): qty 1

## 2018-01-21 MED ORDER — STROKE: EARLY STAGES OF RECOVERY BOOK
Freq: Once | Status: AC
  Administered 2018-01-21: 09:00:00
  Filled 2018-01-21: qty 1

## 2018-01-21 MED ORDER — MECLIZINE HCL 12.5 MG PO TABS
25.0000 mg | ORAL_TABLET | Freq: Three times a day (TID) | ORAL | Status: DC | PRN
  Administered 2018-01-21: 25 mg via ORAL
  Filled 2018-01-21 (×2): qty 1

## 2018-01-21 NOTE — ED Provider Notes (Addendum)
TIME SEEN: 12:57 AM  CHIEF COMPLAINT: Vertigo  HPI: Patient is an 84 year old male with history of CAD, CHF, hypertension, hyperlipidemia, previous stroke who presents to the emergency department with complaints of vertigo.  States symptoms started when he was lying in bed at 8 PM tonight.  States he felt like the room was spinning.  Symptoms do not worsen if he turns his head in one direction.  He states he is unable to walk and had to crawl out of the bed to call to his wife who is in the den.  He was diaphoretic and nauseated per his daughter.  Could not get off the floor.  No chest pain or shortness of breath.  He denies that he felt lightheaded or had a near syncopal event.  He denies any hearing changes.  No tinnitus or ear pain.  Has never had vertigo before.  Heart rate is normally in the 40s.  Does not wear oxygen chronically.  No numbness, tingling or focal weakness currently.  States he did feel some tingling in the left leg briefly earlier this evening when he had the initial onset of vertigo.  ROS: See HPI Constitutional: no fever  Eyes: no drainage  ENT: no runny nose   Cardiovascular:  no chest pain  Resp: no SOB  GI: no vomiting GU: no dysuria Integumentary: no rash  Allergy: no hives  Musculoskeletal: no leg swelling  Neurological: no slurred speech ROS otherwise negative  PAST MEDICAL HISTORY/PAST SURGICAL HISTORY:  Past Medical History:  Diagnosis Date  . CAD (coronary artery disease)   . CHF (congestive heart failure) ([place])   . Frequent unifocal PVCs    Palpitations since early 20s, PVCs and pairs  . Hyperlipidemia   . Hypertension   . Stroke [facility]) 2009    MEDICATIONS:  Prior to Admission medications   Medication Sig Start Date End Date Taking? Authorizing Provider  Ascorbic Acid (VITAMIN C PO) Take 250 mg by mouth 3 (three) times a week.     [provider]  aspirin 81 MG tablet Take 81 mg by mouth every other day.    [provider]   atorvastatin (LIPITOR) 20 MG tablet Take 1 tablet (20 mg total) by mouth daily. 12/03/17   Lyda Jester M, PA-C  benzonatate (TESSALON) 100 MG capsule Take 1 capsule (100 mg total) by mouth 3 (three) times daily as needed for cough. 12/07/17   [name], MD  carvedilol (COREG) 3.125 MG tablet Take 1 tablet (3.125 mg total) by mouth 2 (two) times daily. 12/01/17 03/01/18  Burnell Blanks, MD  Cholecalciferol (VITAMIN D PO) Take 5,000 Units by mouth daily.    [provider]  Coenzyme Q10 (COQ10) 100 MG CAPS Take 1 capsule by mouth daily.    [provider]  furosemide (LASIX) 20 MG tablet TAKE ONE TABLET BY MOUTH ONCE DAILY 11/24/17   [name], MD  losartan (COZAAR) 25 MG tablet Take 0.5 tablets (12.5 mg total) by mouth daily. 12/03/17   Consuelo Pandy, PA-C  Magnesium 100 MG TABS Take 1 tablet by mouth 2 (two) times daily.    [provider]  Misc Natural Products (COMPLETE PROSTATE HEALTH) TABS Take by mouth.    [provider]  Multiple Vitamin (MULTIVITAMIN WITH MINERALS) TABS tablet Take 1 tablet by mouth daily.    [provider]  Multiple Vitamins-Minerals (ZINC PO) Take by mouth.    [provider]  nitroGLYCERIN (NITROSTAT) 0.4 MG SL  tablet Place 1 tablet (0.4 mg total) under the tongue every 5 (five) minutes as needed. 06/25/17   Consuelo Pandy, PA-C  Potassium 99 MG TABS Take 99 mg by mouth daily.     [provider]  tamsulosin (FLOMAX) 0.4 MG CAPS capsule Take 1 capsule (0.4 mg total) by mouth daily. 01/01/18   [name], MD  vitamin E 400 UNIT capsule Take 400 Units by mouth 3 (three) times a week.    [provider]    ALLERGIES:  No Known Allergies  SOCIAL HISTORY:  Social History   Tobacco Use  . Smoking status: Never Smoker  . Smokeless tobacco: Never Used  Substance Use Topics  . Alcohol use: No    FAMILY HISTORY: Family History  Problem Relation Age of  Onset  . Stroke Mother   . Early death Mother   . Cancer Brother     EXAM: BP (!) 162/93   Pulse (!) 47   Temp 97.6 F (36.4 C) (Oral)   Resp (!) 22   SpO2 92%  CONSTITUTIONAL: Alert and oriented x3 and responds appropriately to questions. Well-appearing; well-nourished, elderly HEAD: Normocephalic, atraumatic EYES: Conjunctivae clear, pupils appear equal, EOMI, no nystagmus ENT: normal nose; moist mucous membranes; No pharyngeal erythema or petechiae, no tonsillar hypertrophy or exudate, no uvular deviation, no unilateral swelling, no trismus or drooling, no muffled voice, normal phonation, no stridor, no dental caries present, no drainable dental abscess noted, no Ludwig's angina, tongue sits flat in the bottom of the mouth, no angioedema, no facial erythema or warmth, no facial swelling; no pain with movement of the neck.  TMs are clear bilaterally without erythema, purulence, bulging, perforation, effusion.  No cerumen impaction or sign of foreign body in the external auditory canal. No inflammation, erythema or drainage from the external auditory canal. No signs of mastoiditis. No pain with manipulation of the pinna bilaterally. NECK: Supple, no meningismus, no nuchal rigidity, no LAD  CARD: Regular and bradycardic; S1 and S2 appreciated; no murmurs, no clicks, no rubs, no gallops RESP: Normal chest excursion without splinting or tachypnea; breath sounds clear and equal bilaterally; no wheezes, no rhonchi, no rales, no hypoxia or respiratory distress, speaking full sentences ABD/GI: Normal bowel sounds; non-distended; soft, non-tender, no rebound, no guarding, no peritoneal signs, no hepatosplenomegaly BACK:  The back appears normal and is non-tender to palpation, there is no CVA tenderness EXT: Normal ROM in all joints; non-tender to palpation; no edema; normal capillary refill; no cyanosis, no calf tenderness or swelling    SKIN: Normal color for age and race; warm; no rash NEURO:  Moves all extremities equally, strength 5/5 in all 4 extremities, sensation to light touch intact diffusely, cranial nerves II through XII intact, no dysmetria to finger-to-nose testing bilaterally, symptoms are not reproduced with turning his head in one direction, no fatigable nystagmus noted on examination PSYCH: The patient's mood and manner are appropriate. Grooming and personal hygiene are appropriate.  MEDICAL DECISION MAKING: Patient here with onset of vertigo at 8 PM.  Currently his stroke scale is 0.  Symptoms started over 5 hours ago.  I do not feel he would be a TPA candidate currently symptoms started 5 hours ago and he is not LVO + but I am concerned for possible central cause of vertigo.  Will obtain MRI of his brain.  Nursing staff reports that he did drop his sats to 88% on room air at rest.  He denies feeling short of breath  or having any chest pain.  He does have frequent PVCs noted on his EKGs and on the monitor which is something that is chronic for him.  Also has a known history of sinus bradycardia as well.  Will obtain chest x-ray, cardiac labs.  Magnesium, potassium are normal today.  Will give meclizine, Zofran for symptomatic relief and reassess.  Lactate obtained by nursing staff.  I do not feel the patient is septic.  His lactate is very mildly elevated.  I do not feel giving him IV fluids as appropriate given his history of CHF.  ED PROGRESS: Patient reports feeling better.  Lactate has improved without intervention.  He does have vascular congestion seen on chest x-ray with a BNP of 2400.  Given Lasix 20 mg IV.  Takes 20 mg at home as needed daily.  Troponin is negative.  Magnesium normal.  MRI of the brain pending for further evaluation.  MRI shows the patient has had an acute left cerebellar infarct.  Will admit for acute stroke and CHF exacerbation.  Patient and family comfortable with this plan.  Will discuss with neurology for consultation.  5:45 AM  D/w Dr. Rory Percy with  neurology.  Neurology will see patient in consult.  Appreciate their help.  6:17 AM Discussed patient's case with hospitalist, Dr. Hal Hope.  I have recommended admission and patient (and family if present) agree with this plan. Admitting physician will place admission orders.   I reviewed all nursing notes, vitals, pertinent previous records, EKGs, lab and urine results, imaging (as available).     EKG Interpretation  Date/Time:  Wednesday Jan 20 2018 23:19:08 EDT Ventricular Rate:  98 PR Interval:  178 QRS Duration: 106 QT Interval:  362 QTC Calculation: 462 R Axis:   89 Text Interpretation:  Sinus rhythm with frequent Premature ventricular complexes Nonspecific ST and T wave abnormality Abnormal ECG When compared with ECG of 01/24/2016, No significant change was found Confirmed by Delora Fuel ([id number]) on 01/20/2018 11:49:27 PM         EKG Interpretation  Date/Time:  Thursday Jan 21 2018 00:23:48 EDT Ventricular Rate:  97 PR Interval:  178 QRS Duration: 110 QT Interval:  379 QTC Calculation: 429 R Axis:   93 Text Interpretation:  Sinus rhythm Frequent PVCs Right axis deviation Consider left ventricular hypertrophy Nonspecific T abnormalities, lateral leads No significant change since last tracing Confirmed by Dwayne Begay, Cyril Mourning [phone]) on 01/21/2018 1:50:26 AM         Rito Lecomte, Delice Bison, DO 01/21/18 0617    CRITICAL CARE Performed by: Cyril Mourning Manvir Thorson   Total critical care time: 35 minutes  Critical care time was exclusive of separately billable procedures and treating other patients.  Critical care was necessary to treat or prevent imminent or life-threatening deterioration.  Critical care was time spent personally by me on the following activities: development of treatment plan with patient and/or surrogate as well as nursing, discussions with consultants, evaluation of patient's response to treatment, examination of patient, obtaining history from patient or surrogate,  ordering and performing treatments and interventions, ordering and review of laboratory studies, ordering and review of radiographic studies, pulse oximetry and re-evaluation of patient's condition.    Princes Finger, Delice Bison, DO 02/01/18 [phone]

## 2018-01-21 NOTE — Progress Notes (Signed)
Stroke Team Progress Note  Patrick Maxwell is a 84 y.o. 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  He describes that as room spinning.  He was unable to maintain his balance.  He tried to stand up but could not stand or walk.  He had to crawl out of his bed to come to the door to call out for help from his wife.  He did not have any chest pain at that time.  He denied any headaches.  He denied any nausea vomiting.  Denies any loss of consciousness or lightheadedness. The symptoms have since improved but have not gone away. Continues to have room spinning sensation.  Was very ataxic on walking for report.  Has not been walked for many hours now. Denies any similar symptoms in the past denies any history suggestive of BPPV. Denies preceding illnesses, sicknesses, fevers, chills, upper respiratory infection, ear infection. Also of note, he is a patient of cardiology service as an outpatient.  He has been followed with cardiac monitoring for frequent PVCs.  Denies any current palpitations.  LKW: 8 PM on 01/20/2018 tpa given?: no, outside the window Premorbid modified Rankin scale (mRS): 1   SUBJECTIVE  I have reviewed the entire history of present illness with the patient and his son. He states he is feeling slightly better he has remote history of stroke more than 10 years ago from which he made a full recovery.  OBJECTIVE Most recent Vital Signs: Temp: 97 F (36.1 C) (05/02 0617) Temp Source: Axillary (05/02 0617) BP: 138/66 (05/02 1521) Pulse Rate: 42 (05/02 1521) Respiratory Rate: 19 O2 Saturdation: 95%  CBG (last 3)  Recent Labs    01/20/18 2318  GLUCAP 170*    Diet:  Diet Order           Diet Heart Room service appropriate? Yes;  Fluid consistency: Thin  Diet effective now            Activity: OOB with assistance  VTE Prophylaxis:   Not needed Studies: Results for orders placed or performed during the hospital encounter of 01/20/18 (from the past 24 hour(s))  Urinalysis, Routine w reflex microscopic     Status: Abnormal   Collection Time: 01/20/18 11:11 PM  Result Value Ref Range   Color, Urine YELLOW YELLOW   APPearance HAZY (A) CLEAR   Specific Gravity, Urine 1.019 1.005 - 1.030   pH 5.0 5.0 - 8.0   Glucose, UA NEGATIVE NEGATIVE mg/dL   Hgb urine dipstick SMALL (A) NEGATIVE   Bilirubin Urine NEGATIVE NEGATIVE   Ketones, ur NEGATIVE NEGATIVE mg/dL   Protein, ur 30 (A) NEGATIVE mg/dL   Nitrite NEGATIVE NEGATIVE   Leukocytes, UA NEGATIVE NEGATIVE   RBC / HPF 0-5 0 - 5 RBC/hpf   WBC, UA 0-5 0 - 5 WBC/hpf   Bacteria, UA NONE SEEN NONE SEEN   Squamous Epithelial / LPF 0-5 0 - 5   Mucus PRESENT   CBG monitoring, ED     Status: Abnormal   Collection Time: 01/20/18 11:18 PM  Result Value Ref Range   Glucose-Capillary 170 (H) 65 - 99 mg/dL   Comment 1 Notify RN    Comment 2 Document in Chart   Basic metabolic panel  Status: Abnormal   Collection Time: 01/20/18 11:24 PM  Result Value Ref Range   Sodium 139 135 - 145 mmol/L   Potassium 4.1 3.5 - 5.1 mmol/L   Chloride 108 101 - 111 mmol/L   CO2 21 (L) 22 - 32 mmol/L   Glucose, Bld 215 (H) 65 - 99 mg/dL   BUN 22 (H) 6 - 20 mg/dL   Creatinine, Ser 1.29 (H) 0.61 - 1.24 mg/dL   Calcium 8.9 8.9 - 10.3 mg/dL   GFR calc non Af Amer 49 (L) >60 mL/min   GFR calc Af Amer 57 (L) >60 mL/min   Anion gap 10 5 - 15  CBC     Status: Abnormal   Collection Time: 01/20/18 11:24 PM  Result Value Ref Range   WBC 11.6 (H) 4.0 - 10.5 K/uL   RBC 5.00 4.22 - 5.81 MIL/uL   Hemoglobin 14.5 13.0 - 17.0 g/dL   HCT 43.4 39.0 - 52.0 %   MCV 86.8 78.0 - 100.0 fL   MCH 29.0 26.0 - 34.0 pg   MCHC 33.4 30.0 - 36.0 g/dL   RDW 15.8 (H) 11.5 - 15.5 %   Platelets 139 (L) 150 -  400 K/uL  I-Stat CG4 Lactic Acid, ED     Status: Abnormal   Collection Time: 01/21/18 12:21 AM  Result Value Ref Range   Lactic Acid, Venous 2.15 (HH) 0.5 - 1.9 mmol/L   Comment NOTIFIED PHYSICIAN   Magnesium     Status: None   Collection Time: 01/21/18 12:30 AM  Result Value Ref Range   Magnesium 1.9 1.7 - 2.4 mg/dL  Brain natriuretic peptide     Status: Abnormal   Collection Time: 01/21/18  1:37 AM  Result Value Ref Range   B Natriuretic Peptide 2,431.3 (H) 0.0 - 100.0 pg/mL  I-stat troponin, ED     Status: None   Collection Time: 01/21/18  1:39 AM  Result Value Ref Range   Troponin i, poc 0.01 0.00 - 0.08 ng/mL   Comment 3          I-Stat CG4 Lactic Acid, ED     Status: None   Collection Time: 01/21/18  2:47 AM  Result Value Ref Range   Lactic Acid, Venous 1.07 0.5 - 1.9 mmol/L     Ct Angio Head W Or Wo Contrast  Result Date: 01/21/2018 CLINICAL DATA:  84 year old male with vertigo and acute left PICA territory infarct affecting left cerebellum on MRI earlier today. EXAM: CT ANGIOGRAPHY HEAD AND NECK TECHNIQUE: Multidetector CT imaging of the head and neck was performed using the standard protocol during bolus administration of intravenous contrast. Multiplanar CT image reconstructions and MIPs were obtained to evaluate the vascular anatomy. Carotid stenosis measurements (when applicable) are obtained utilizing NASCET criteria, using the distal internal carotid diameter as the denominator. CONTRAST:  19mL ISOVUE-370 IOPAMIDOL (ISOVUE-370) INJECTION 76% COMPARISON:  Brain MRI 0459 hours today. FINDINGS: CT HEAD Brain: Confluent hypodensity in the medial inferior left cerebellum corresponding to the area of diffusion abnormality on MRI earlier today (series 5, image 6). No associated hemorrhage. No posterior fossa mass effect. The 4th ventricle is patent. Hypodensity in the supratentorial brain especially the left deep gray matter nuclei corresponds to chronic MRI signal today. No midline  shift, ventriculomegaly, mass effect, evidence of mass lesion, intracranial hemorrhage. Calvarium and skull base: No acute osseous abnormality identified. Paranasal sinuses: Clear. Orbits: Negative orbit and scalp soft tissues. CTA NECK Skeleton: No acute osseous abnormality identified. Cervical  spine degeneration. Incidental right maxillary sinus alveolar recess mucous retention cysts. Upper chest: Questionable trace layering right pleural effusion. Questionable mild centrilobular emphysema in the upper lungs. There is mild dependent atelectasis, and also mild apical septal thickening bilaterally. No superior mediastinal lymphadenopathy. Other neck: Negative.  No neck mass or lymphadenopathy. Aortic arch: Moderate Calcified aortic atherosclerosis. 3 vessel arch configuration. Right carotid system: No brachiocephalic or right CCA origin stenosis despite some soft plaque. Mild proximal right CCA tortuosity. Mild calcified and soft plaque at the right carotid bifurcation with no cervical right ICA stenosis. Left carotid system: No left CCA stenosis despite some plaque. Tortuous proximal left CCA. Minimal plaque at the left carotid bifurcation. Mild soft and calcified plaque in the distal left ICA bulb. No cervical left ICA stenosis. Vertebral arteries: No proximal right subclavian artery stenosis despite mild plaque. Normal right vertebral artery origin. Tortuous right V1 segment. Patent right vertebral artery to the skull base without stenosis. No proximal left subclavian artery stenosis despite soft and calcified plaque. The left vertebral artery origin is normal (series 9 image 141). Mild to moderate tortuosity of the left V1 segment. Tortuous proximal left V2 segment. The left vertebral artery is patent to the skull base without stenosis. CTA HEAD Posterior circulation: The left vertebral V4 segment is patent but demonstrates areas of mild to moderate irregularity suspicious for atherosclerosis. No hemodynamically  significant distal left vertebral stenosis results. The left PICA origin appears partially thrombosed on series 11, image 147, but there is some preserved PICA enhancement. There is mild to moderate irregularity also in the right vertebral artery V4 segment compatible with atherosclerosis, no significant stenosis. Patent right PICA origin. Patent vertebrobasilar junction. Patent basilar artery with mild irregularity, no stenosis. Normal SCA and PCA origins. Posterior communicating arteries are diminutive or absent. Mild bilateral PCA irregularity compatible with atherosclerosis. Preserved distal PCA enhancement. Anterior circulation: Patent left ICA siphon with mild to moderate calcified plaque beginning in the cavernous segment. There is only mild distal left cavernous and proximal supraclinoid left ICA stenosis. Normal left ophthalmic artery origin. The right ICA siphon is patent with similar moderate calcified plaque. There is mild right cavernous but moderate proximal right supraclinoid ICA stenosis (series 11, image 88 and series 13 image 66). Normal right ophthalmic artery origin. The bilateral carotid termini are patent. MCA and ACA origins are patent with mild irregularity. Mild ACA A1 segment irregularity. Anterior communicating artery is normal. There is moderate distal left A2 segment irregularity and stenosis (series 16, image 21). Other ACA branches are normal to mildly irregular. The left MCA M1 segment is mildly irregular. The left MCA bifurcation is patent. There is mild anterior M2 and moderate to severe anterior M3 segment irregularity with stenoses (series 16, image 28). Mild left MCA branch irregularity elsewhere. Right MCA M1 segment is mildly irregular and bifurcates early. There is up to moderate stenosis at the right MCA bifurcation best seen on series 16, image 15. Right MCA branches are mildly irregular. Venous sinuses: Patent. Anatomic variants: None. Delayed phase: No abnormal enhancement  identified. The boundary of cytotoxic edema in the inferior left cerebellum is more apparent following contrast. Review of the MIP images confirms the above findings IMPRESSION: 1. Negative for large vessel occlusion. 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 2. There is little extracranial atherosclerosis, with no cervical carotid or vertebral artery stenosis. But there is moderate generalized intracranial atherosclerosis. Mostly mild intracranial arterial stenoses are noted - including in both vertebral artery  V4 segments - with the following exceptions: - moderate right ICA siphon supraclinoid stenosis due to calcified plaque. - moderate right MCA bifurcation stenosis. - moderate left ACA A2 segment stenosis. - moderate and severe left MCA anterior division M3 stenoses. 3. Stable appearance of the acute left PICA infarct from the MRI earlier today. Cytotoxic edema with no hemorrhage or mass effect at this time. 4. Aortic Atherosclerosis (ICD10-I70.0). Electronically Signed   By: Genevie Ann M.D.   On: 01/21/2018 07:56   Ct Angio Neck W Or Wo Contrast  Result Date: 01/21/2018 CLINICAL DATA:  84 year old male with vertigo and acute left PICA territory infarct affecting left cerebellum on MRI earlier today. EXAM: CT ANGIOGRAPHY HEAD AND NECK TECHNIQUE: Multidetector CT imaging of the head and neck was performed using the standard protocol during bolus administration of intravenous contrast. Multiplanar CT image reconstructions and MIPs were obtained to evaluate the vascular anatomy. Carotid stenosis measurements (when applicable) are obtained utilizing NASCET criteria, using the distal internal carotid diameter as the denominator. CONTRAST:  83mL ISOVUE-370 IOPAMIDOL (ISOVUE-370) INJECTION 76% COMPARISON:  Brain MRI 0459 hours today. FINDINGS: CT HEAD Brain: Confluent hypodensity in the medial inferior left cerebellum corresponding to the area of diffusion abnormality on MRI earlier today (series  5, image 6). No associated hemorrhage. No posterior fossa mass effect. The 4th ventricle is patent. Hypodensity in the supratentorial brain especially the left deep gray matter nuclei corresponds to chronic MRI signal today. No midline shift, ventriculomegaly, mass effect, evidence of mass lesion, intracranial hemorrhage. Calvarium and skull base: No acute osseous abnormality identified. Paranasal sinuses: Clear. Orbits: Negative orbit and scalp soft tissues. CTA NECK Skeleton: No acute osseous abnormality identified. Cervical spine degeneration. Incidental right maxillary sinus alveolar recess mucous retention cysts. Upper chest: Questionable trace layering right pleural effusion. Questionable mild centrilobular emphysema in the upper lungs. There is mild dependent atelectasis, and also mild apical septal thickening bilaterally. No superior mediastinal lymphadenopathy. Other neck: Negative.  No neck mass or lymphadenopathy. Aortic arch: Moderate Calcified aortic atherosclerosis. 3 vessel arch configuration. Right carotid system: No brachiocephalic or right CCA origin stenosis despite some soft plaque. Mild proximal right CCA tortuosity. Mild calcified and soft plaque at the right carotid bifurcation with no cervical right ICA stenosis. Left carotid system: No left CCA stenosis despite some plaque. Tortuous proximal left CCA. Minimal plaque at the left carotid bifurcation. Mild soft and calcified plaque in the distal left ICA bulb. No cervical left ICA stenosis. Vertebral arteries: No proximal right subclavian artery stenosis despite mild plaque. Normal right vertebral artery origin. Tortuous right V1 segment. Patent right vertebral artery to the skull base without stenosis. No proximal left subclavian artery stenosis despite soft and calcified plaque. The left vertebral artery origin is normal (series 9 image 141). Mild to moderate tortuosity of the left V1 segment. Tortuous proximal left V2 segment. The left  vertebral artery is patent to the skull base without stenosis. CTA HEAD Posterior circulation: The left vertebral V4 segment is patent but demonstrates areas of mild to moderate irregularity suspicious for atherosclerosis. No hemodynamically significant distal left vertebral stenosis results. The left PICA origin appears partially thrombosed on series 11, image 147, but there is some preserved PICA enhancement. There is mild to moderate irregularity also in the right vertebral artery V4 segment compatible with atherosclerosis, no significant stenosis. Patent right PICA origin. Patent vertebrobasilar junction. Patent basilar artery with mild irregularity, no stenosis. Normal SCA and PCA origins. Posterior communicating arteries are diminutive or absent. Mild  bilateral PCA irregularity compatible with atherosclerosis. Preserved distal PCA enhancement. Anterior circulation: Patent left ICA siphon with mild to moderate calcified plaque beginning in the cavernous segment. There is only mild distal left cavernous and proximal supraclinoid left ICA stenosis. Normal left ophthalmic artery origin. The right ICA siphon is patent with similar moderate calcified plaque. There is mild right cavernous but moderate proximal right supraclinoid ICA stenosis (series 11, image 88 and series 13 image 66). Normal right ophthalmic artery origin. The bilateral carotid termini are patent. MCA and ACA origins are patent with mild irregularity. Mild ACA A1 segment irregularity. Anterior communicating artery is normal. There is moderate distal left A2 segment irregularity and stenosis (series 16, image 21). Other ACA branches are normal to mildly irregular. The left MCA M1 segment is mildly irregular. The left MCA bifurcation is patent. There is mild anterior M2 and moderate to severe anterior M3 segment irregularity with stenoses (series 16, image 28). Mild left MCA branch irregularity elsewhere. Right MCA M1 segment is mildly irregular and  bifurcates early. There is up to moderate stenosis at the right MCA bifurcation best seen on series 16, image 15. Right MCA branches are mildly irregular. Venous sinuses: Patent. Anatomic variants: None. Delayed phase: No abnormal enhancement identified. The boundary of cytotoxic edema in the inferior left cerebellum is more apparent following contrast. Review of the MIP images confirms the above findings IMPRESSION: 1. Negative for large vessel occlusion. 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 2. There is little extracranial atherosclerosis, with no cervical carotid or vertebral artery stenosis. But there is moderate generalized intracranial atherosclerosis. Mostly mild intracranial arterial stenoses are noted - including in both vertebral artery V4 segments - with the following exceptions: - moderate right ICA siphon supraclinoid stenosis due to calcified plaque. - moderate right MCA bifurcation stenosis. - moderate left ACA A2 segment stenosis. - moderate and severe left MCA anterior division M3 stenoses. 3. Stable appearance of the acute left PICA infarct from the MRI earlier today. Cytotoxic edema with no hemorrhage or mass effect at this time. 4. Aortic Atherosclerosis (ICD10-I70.0). Electronically Signed   By: Genevie Ann M.D.   On: 01/21/2018 07:56   Mr Brain Wo Contrast  Result Date: 01/21/2018 CLINICAL DATA:  Persistent central vertigo.  Near syncope. EXAM: MRI HEAD WITHOUT CONTRAST TECHNIQUE: Multiplanar, multiecho pulse sequences of the brain and surrounding structures were obtained without intravenous contrast. COMPARISON:  None. FINDINGS: BRAIN: The midline structures are normal. Abnormal diffusion restriction within the inferior left cerebellar hemisphere, in the distribution of the posterior inferior cerebellar artery. No mass effect or acute hemorrhage. Fourth ventricle remains widely patent. Old left basal ganglia and corona radiata lacunar infarcts. Mild periventricular  white matter hyperintensity. No age-advanced or lobar predominant atrophy. There is blooming on susceptibility weighted imaging in the area of the left posterior inferior cerebellar artery, which may indicate thrombus. VASCULAR: Major intracranial arterial and venous sinus flow voids are preserved. SKULL AND UPPER CERVICAL SPINE: The visualized skull base, calvarium, upper cervical spine and extracranial soft tissues are normal. SINUSES/ORBITS: Small maxillary retention cysts. No mastoid or middle ear effusion. Normal orbits. IMPRESSION: 1. Acute left posterior inferior cerebellar artery territory infarct without hemorrhage or mass effect 2. Chronic small vessel disease with old left basal ganglia and corona radiata lacunar infarcts. Electronically Signed   By: Ulyses Jarred M.D.   On: 01/21/2018 05:32   Dg Chest Portable 1 View  Result Date: 01/21/2018 CLINICAL DATA:  Dyspnea and hypoxia. EXAM:  PORTABLE CHEST 1 VIEW COMPARISON:  12/07/2017 FINDINGS: Cardiomegaly with aortic atherosclerosis. Mild central vascular congestion superimposed on chronic interstitial lung disease. No effusion or pulmonary consolidation. No pneumothorax. Degenerative changes are seen about both shoulders and dorsal spine. IMPRESSION: Cardiomegaly with aortic atherosclerosis.  Mild CHF. Electronically Signed   By: Ashley Royalty M.D.   On: 01/21/2018 01:48    Physical Exam:   Pleasant elderly Caucasian male currently not in distress. . Afebrile. Head is nontraumatic. Neck is supple without bruit.    Cardiac exam no murmur or gallop. Lungs are clear to auscultation. Distal pulses are well felt. Neurological Exam ;  Awake  Alert oriented x 3. Normal speech and language except for mild dysarthria.[place] movements full without nystagmus.fundi were not visualized. Vision acuity and fields appear normal. Hearing is normal. Palatal movements are normal. Face symmetric. Tongue midline. Normal strength, tone, reflexes and coordination. Normal  sensation. Gait deferred.  ASSESSMENT Patrick Maxwell is a 84 y.o. male  7 onset of dysarthria and dizziness due to left posterior inferior cerebellar artery infarct.etiology to be determined. Vascular risk factors of hypertension, hyperlipidemia, CHF and mild obesity  Hospital day # 0  TREATMENT/PLAN  recommend dual antiplatelet therapy of aspirin and Plavix for 3 weeks followed by Plavix alone. Check echocardiogram,and lipid profile and hemoglobin A1c. May need transesophageal echocardiogram and prolonged cardiac monitoring later. Physical occupational and speech therapy consults.Patient  was asked to consider possible participation in the BMS stroke prevention trial but after reviewing information he declined.long discussion with patient and his 2 sons at the bedside and answered questions. Greater than 50% time during this 35 minute visit was spent on counseling and coordination of care about his embolic stroke in discussing evaluation and treatment plan and answered questions.  Antony Contras, MD [facility] Pager: [phone] 01/21/2018 4:58 PM

## 2018-01-21 NOTE — Telephone Encounter (Signed)
Went  To VM , I  Informed  I am the  MD on call and  If  Pt  Has  Severe symptosm  As  Described  To come to hospital ER  For eval

## 2018-01-21 NOTE — Telephone Encounter (Signed)
Thanks

## 2018-01-21 NOTE — Progress Notes (Deleted)
No chief complaint on file.    History of Present Illness: 84 yo male with history of CAD, PVCs, HLD, HTN and CVA who is here today for follow up. He has been followed by Dr. Gwenlyn Found. I began following him in my office in April 2018. I performed his cardiac cath in May 2017. His cardiac cath showed a severe mid LAD stenosis treated with a drug eluting stent and a severe stenosis in the obtuse marginal artery treated with a drug eluting stent. Echo May 2017 with LVEF=30-35% prior to his PCI. After the PCI, Echo in August 2017 showed improvement in LVEF to 40-45%. Also noted to have mild mitral valve regurgitation. He is known to have PVCs.   He is here today for follow up. The patient denies any chest pain, dyspnea, palpitations, lower extremity edema, orthopnea, PND, dizziness, near syncope or syncope.   Primary Care Physician: [name], MD  Past Medical History:  Diagnosis Date  . CAD (coronary artery disease)   . CHF (congestive heart failure) ([place])   . Frequent unifocal PVCs    Palpitations since early 20s, PVCs and pairs  . Hyperlipidemia   . Hypertension   . Stroke [facility]) 2009    Past Surgical History:  Procedure Laterality Date  . CARDIAC CATHETERIZATION N/A 01/23/2016   Procedure: Right/Left Heart Cath and Coronary Angiography;  Surgeon: Burnell Blanks, MD;  Location: [place] [facility];  Service: Cardiovascular;  Laterality: N/A;  . CARDIAC CATHETERIZATION N/A 01/23/2016   Procedure: Coronary Stent Intervention;  Surgeon: Burnell Blanks, MD;  Location: [place] [facility];  Service: Cardiovascular;  Laterality: N/A;  . HERNIA REPAIR  06/1992    Current Outpatient Medications  Medication Sig Dispense Refill  . Ascorbic Acid (VITAMIN C PO) Take 250 mg by mouth 3 (three) times a week.     Marland Kitchen aspirin 81 MG tablet Take 81 mg by mouth daily.     Marland Kitchen atorvastatin (LIPITOR) 20 MG tablet Take 1 tablet (20 mg total) by mouth daily. (Patient not taking: Reported on  01/21/2018) 90 tablet 3  . benzonatate (TESSALON) 100 MG capsule Take 1 capsule (100 mg total) by mouth 3 (three) times daily as needed for cough. (Patient not taking: Reported on 01/21/2018) 20 capsule 0  . carvedilol (COREG) 3.125 MG tablet Take 1 tablet (3.125 mg total) by mouth 2 (two) times daily. 180 tablet 3  . Cholecalciferol (VITAMIN D PO) Take 5,000 Units by mouth daily.    . Coenzyme Q10 (COQ10) 100 MG CAPS Take 1 capsule by mouth daily.    . furosemide (LASIX) 20 MG tablet TAKE ONE TABLET BY MOUTH ONCE DAILY 90 tablet 1  . losartan (COZAAR) 25 MG tablet Take 0.5 tablets (12.5 mg total) by mouth daily. 90 tablet 3  . Magnesium 100 MG TABS Take 1 tablet by mouth 2 (two) times daily.    . Multiple Vitamin (MULTIVITAMIN WITH MINERALS) TABS tablet Take 1 tablet by mouth daily.    . nitroGLYCERIN (NITROSTAT) 0.4 MG SL tablet Place 1 tablet (0.4 mg total) under the tongue every 5 (five) minutes as needed. 25 tablet 3  . Potassium 99 MG TABS Take 99 mg by mouth daily.     . tamsulosin (FLOMAX) 0.4 MG CAPS capsule Take 1 capsule (0.4 mg total) by mouth daily. 30 capsule 3  . vitamin E 400 UNIT capsule Take 400 Units by mouth 3 (three) times a week.     No current facility-administered medications  for this visit.     No Known Allergies  Social History   Socioeconomic History  . Marital status: Married    Spouse name: Not on file  . Number of children: Not on file  . Years of education: Not on file  . Highest education level: Not on file  Occupational History  . Not on file  Social Needs  . Financial resource strain: Not on file  . Food insecurity:    Worry: Not on file    Inability: Not on file  . Transportation needs:    Medical: Not on file    Non-medical: Not on file  Tobacco Use  . Smoking status: Never Smoker  . Smokeless tobacco: Never Used  Substance and Sexual Activity  . Alcohol use: No  . Drug use: No  . Sexual activity: Not Currently  Lifestyle  . Physical  activity:    Days per week: Not on file    Minutes per session: Not on file  . Stress: Not on file  Relationships  . Social connections:    Talks on phone: Not on file    Gets together: Not on file    Attends religious service: Not on file    Active member of club or organization: Not on file    Attends meetings of clubs or organizations: Not on file    Relationship status: Not on file  . Intimate partner violence:    Fear of current or ex partner: Not on file    Emotionally abused: Not on file    Physically abused: Not on file    Forced sexual activity: Not on file  Other Topics Concern  . Not on file  Social History Narrative  . Not on file    Family History  Problem Relation Age of Onset  . Stroke Mother   . Early death Mother   . Cancer Brother     Review of Systems:  As stated in the HPI and otherwise negative.   There were no vitals taken for this visit.  Physical Examination:  General: Well developed, well nourished, NAD  HEENT: OP clear, mucus membranes moist  SKIN: warm, dry. No rashes. Neuro: No focal deficits  Musculoskeletal: Muscle strength 5/5 all ext  Psychiatric: Mood and affect normal  Neck: No JVD, no carotid bruits, no thyromegaly, no lymphadenopathy.  Lungs:Clear bilaterally, no wheezes, rhonci, crackles Cardiovascular: Regular rate and rhythm. No murmurs, gallops or rubs. Abdomen:Soft. Bowel sounds present. Non-tender.  Extremities: No lower extremity edema. Pulses are 2 + in the bilateral DP/PT.  EKG:  EKG is *** ordered today. The ekg ordered today demonstrates   Recent Labs: 11/09/2017: ALT 18; TSH 3.98 01/20/2018: BUN 22; Creatinine, Ser 1.29; Hemoglobin 14.5; Platelets 139; Potassium 4.1; Sodium 139 01/21/2018: B Natriuretic Peptide 2,431.3; Magnesium 1.9   Lipid Panel    Component Value Date/Time   CHOL 178 11/09/2017 0841   TRIG 90 11/09/2017 0841   HDL 45 11/09/2017 0841   CHOLHDL 4.0 11/09/2017 0841   VLDL 20 12/30/2016 0818    LDLCALC 114 (H) 11/09/2017 0841     Wt Readings from Last 3 Encounters:  12/11/17 193 lb (87.5 kg)  12/07/17 199 lb (90.3 kg)  12/03/17 196 lb (88.9 kg)     Other studies Reviewed: Additional studies/ records that were reviewed today include: . Review of the above records demonstrates:   Assessment and Plan:   1. CAD without angina: He has no chest pain suggestive of angina. Will  continue ASA, Plavix, beta blocker. He does not tolerate statins.   2. HTN: BP is controlled. No changes.   3. Hyperlipidemia: LDL is near goal. He refuses to restart a statin.    4. Ischemic cardiomyopathy/Chronic systolic CHF: Volume status is ok today. LVEF improved post PCI. Continue Lasix. Will also continue beta blocker and Ace-inh.   5. PVCs: He has had these for years. He has no palpitations.   Current medicines are reviewed at length with the patient today.  The patient does not have concerns regarding medicines.  The following changes have been made:  no change  Labs/ tests ordered today include:   No orders of the defined types were placed in this encounter.    Disposition:   FU with me in 12 months   Signed, Lauree Chandler, MD 01/21/2018 5:54 AM    [facility] [address], [place], [place]  [address] Phone: [phone]; Fax: [phone]

## 2018-01-21 NOTE — Progress Notes (Addendum)
Talked to family they wanted to know the results of patients ECHO, I also gave them a stroke book as they will be taking direct care of patient, son is staying over night.

## 2018-01-21 NOTE — ED Notes (Signed)
ECHO in progress- 

## 2018-01-21 NOTE — ED Notes (Signed)
Pt. To CT via stretcher. 

## 2018-01-21 NOTE — Telephone Encounter (Signed)
New Message:    Pt's wife called and wanted you to let Dr Angelena Form and Ellen Henri know that the pt had a stroke last night and is being admitted to the hospital.

## 2018-01-21 NOTE — H&P (Addendum)
History and Physical    MERTON WADLOW VEL:[id number] DOB: [birth year] DOA: 01/20/2018   PCP: [name], MD   Patient coming from:  Home    Chief Complaint: Left sided tingling, dysarthria, vertigo.  HPI: Patrick Maxwell is a 84 y.o. male with a history of CAD with last cardiac catheterization May 2017, ischemic cardiomyopathy with chronic systolic heart failure with with last echo in November 13, 2017 showing ejection fraction 35 to 40%, history of bradycardia, history of PVCs PACs low at heart care, Dr. Serita Sheller, hypertension, hyperlipidemia last LDL in February 2019 at 114, history of CVA 10 years ago, prior to the emergency department in for evaluation of the onset of vertigo, and left-sided tingling.  Patient noticed symptoms around 8 PM, and experiencing a sudden onset of vertigo, described as room spinning, and he was unable to maintain his balance, crawling out of his bed to come to the door to call out for help from his wife.  The patient did not take an aspirin at the time. The tingling was described as left-sided going from the left arm, through the left flank area to the hip and to the left leg.  The patient denies any nausea or vomiting.  No loss of consciousness or presyncope.  He denies any headaches.  He denies any vision changes except at the initial symptoms, his wife reports that he was looking to his right, but no seizure was evident.  He denies any dysphagia.  He had some mild dysarthria prior to presentation, but this has improved to his baseline residual from his prior stroke.  From report, the patient was ataxic, but apparently during his hospitalization, tingling symptoms on the left and his vertigo, have resolved.  He denies any chest pain or palpitations, he denies any shortness of breath or cough.  He denies any abdominal pain, dysuria, lower extremity swelling or calf pain.  He denies any fever, chills, recent illnesses.  He denies any recent infections or sick  contacts.  He denies any recent hospitalizations, or long distance trips.  He takes an aspirin a day.  He denies any tobacco, alcohol or recreational drug use.  He denies a history of benign positional vertigo, or Mnire's disease.  On presentation no TPA was given, as the patient is outside the window, drinking scale is 1  ED Course:  BP 131/66   Pulse (!) 39   Temp (!) 97 F (36.1 C) (Axillary)   Resp 19   SpO2 98%   He received Antivert with improvement of his vertigo, and Zofran for nausea Also received aspirin 325 at the ER Neurology consultation was obtained. On presentation no TPA was given, as the patient is outside the window, drinking scale is 1 MRI of the brain showed brain restricted diffusion in the left posterior ICA territory without hemorrhage or mass-effect.  There is chronic small vessel disease and old left basal ganglia and corona radiata lacunar infarcts   Per Neurology consultation report, although the etiology is unknown, embolic source is suspected, given his history of PVCs.  A new 2D echo has been ordered for further review. CT Angio of the head and neck are neg for large vessel occlusion. BNP is 2431.3 suggestive of acute on chronic heart failure Troponin is negative at 0.01 EKG shows sinus rhythm, frequent PVCs, possible LVH, no significant change since last tracing. Lactic acid was initially 2.15, now 1.07. New 2D echo is pending Hemoglobin A1c, lipid panel are pending Other chemistries include  creatinine of 1.29, at baseline. Glucose 215  Review of Systems:  As per HPI otherwise all other systems reviewed and are negative  Past Medical History:  Diagnosis Date  . CAD (coronary artery disease)   . CHF (congestive heart failure) ([place])   . Frequent unifocal PVCs    Palpitations since early 20s, PVCs and pairs  . Hyperlipidemia   . Hypertension   . Stroke [facility] [place]) 2009    Past Surgical History:  Procedure Laterality Date  . CARDIAC CATHETERIZATION N/A  01/23/2016   Procedure: Right/Left Heart Cath and Coronary Angiography;  Surgeon: Burnell Blanks, MD;  Location: [facility];  Service: Cardiovascular;  Laterality: N/A;  . CARDIAC CATHETERIZATION N/A 01/23/2016   Procedure: Coronary Stent Intervention;  Surgeon: Burnell Blanks, MD;  Location: [facility];  Service: Cardiovascular;  Laterality: N/A;  . HERNIA REPAIR  06/1992    Social History Social History   Socioeconomic History  . Marital status: Married    Spouse name: Not on file  . Number of children: Not on file  . Years of education: Not on file  . Highest education level: Not on file  Occupational History  . Not on file  Social Needs  . Financial resource strain: Not on file  . Food insecurity:    Worry: Not on file    Inability: Not on file  . Transportation needs:    Medical: Not on file    Non-medical: Not on file  Tobacco Use  . Smoking status: Never Smoker  . Smokeless tobacco: Never Used  Substance and Sexual Activity  . Alcohol use: No  . Drug use: No  . Sexual activity: Not Currently  Lifestyle  . Physical activity:    Days per week: Not on file    Minutes per session: Not on file  . Stress: Not on file  Relationships  . Social connections:    Talks on phone: Not on file    Gets together: Not on file    Attends religious service: Not on file    Active member of club or organization: Not on file    Attends meetings of clubs or organizations: Not on file    Relationship status: Not on file  . Intimate partner violence:    Fear of current or ex partner: Not on file    Emotionally abused: Not on file    Physically abused: Not on file    Forced sexual activity: Not on file  Other Topics Concern  . Not on file  Social History Narrative  . Not on file     No Known Allergies  Family History  Problem Relation Age of Onset  . Stroke Mother   . Early death Mother   . Cancer Brother       Prior to Admission medications    Medication Sig Start Date End Date Taking? Authorizing Provider  Ascorbic Acid (VITAMIN C PO) Take 250 mg by mouth 3 (three) times a week.    Yes [provider]  aspirin 81 MG tablet Take 81 mg by mouth daily.    Yes [provider]  carvedilol (COREG) 3.125 MG tablet Take 1 tablet (3.125 mg total) by mouth 2 (two) times daily. 12/01/17 03/01/18 Yes Burnell Blanks, MD  Cholecalciferol (VITAMIN D PO) Take 5,000 Units by mouth daily.   Yes [provider]  Coenzyme Q10 (COQ10) 100 MG CAPS Take 1 capsule by mouth daily.   Yes [provider]  furosemide (LASIX) 20 MG tablet TAKE ONE TABLET BY MOUTH ONCE DAILY 11/24/17  Yes [place], Modena Nunnery, MD  losartan (COZAAR) 25 MG tablet Take 0.5 tablets (12.5 mg total) by mouth daily. 12/03/17  Yes Lyda Jester M, PA-C  Magnesium 100 MG TABS Take 1 tablet by mouth 2 (two) times daily.   Yes [provider]  Multiple Vitamin (MULTIVITAMIN WITH MINERALS) TABS tablet Take 1 tablet by mouth daily.   Yes [provider]  nitroGLYCERIN (NITROSTAT) 0.4 MG SL tablet Place 1 tablet (0.4 mg total) under the tongue every 5 (five) minutes as needed. 06/25/17  Yes Lyda Jester M, PA-C  Potassium 99 MG TABS Take 99 mg by mouth daily.    Yes [provider]  tamsulosin (FLOMAX) 0.4 MG CAPS capsule Take 1 capsule (0.4 mg total) by mouth daily. 01/01/18  Yes [place], Modena Nunnery, MD  vitamin E 400 UNIT capsule Take 400 Units by mouth 3 (three) times a week.   Yes [provider]  atorvastatin (LIPITOR) 20 MG tablet Take 1 tablet (20 mg total) by mouth daily. Patient not taking: Reported on 01/21/2018 12/03/17   Lyda Jester M, PA-C  benzonatate (TESSALON) 100 MG capsule Take 1 capsule (100 mg total) by mouth 3 (three) times daily as needed for cough. Patient not taking: Reported on 01/21/2018 12/07/17   [name], MD    Physical Exam:  Vitals:   01/21/18 0617 01/21/18 0630  01/21/18 0730 01/21/18 0800  BP:  138/71 (!) 141/79 131/66  Pulse:  (!) 43 (!) 41 (!) 39  Resp:  20 18 19   Temp: (!) 97 F (36.1 C)     TempSrc: Axillary     SpO2:  97% 97% 98%   Constitutional: NAD, calm, ill appearing.  Currently, the patient denies any vertigo. Eyes: PERRL, lids and conjunctivae normal.  No nystagmus is noted. ENMT: Mucous membranes are moist, without exudate or lesions  Neck: normal, supple, no masses, no thyromegaly Respiratory: clear to auscultation bilaterally, no wheezing, no crackles. Normal respiratory effort  Cardiovascular: Regular rate and rhythm, 2/6 murmur, rubs or gallops patient has frequent premature beats, known. No extremity edema. 2+ pedal pulses. No carotid bruits.  Abdomen: Soft, non tender, No hepatosplenomegaly. Bowel sounds positive.  Musculoskeletal: no clubbing / cyanosis. Moves all extremities Skin: no jaundice, No lesions.  Neurologic: Sensation intact  Strength equal in all extremities Psychiatric:   Alert and oriented x 3. Normal mood.     Labs on Admission: I have personally reviewed following labs and imaging studies  CBC: Recent Labs  Lab 01/20/18 2324  WBC 11.6*  HGB 14.5  HCT 43.4  MCV 86.8  PLT 139*    Basic Metabolic Panel: Recent Labs  Lab 01/20/18 2324 01/21/18 0030  NA 139  --   K 4.1  --   CL 108  --   CO2 21*  --   GLUCOSE 215*  --   BUN 22*  --   CREATININE 1.29*  --   CALCIUM 8.9  --   MG  --  1.9    GFR: CrCl cannot be calculated (Unknown ideal weight.).  Liver Function Tests: No results for input(s): AST, ALT, ALKPHOS, BILITOT, PROT, ALBUMIN in the last 168 hours. No results for input(s): LIPASE, AMYLASE in the last 168 hours. No results for input(s): AMMONIA in the last 168 hours.  Coagulation Profile: No results for input(s): INR, PROTIME in the last 168 hours.  Cardiac Enzymes: No  results for input(s): CKTOTAL, CKMB, CKMBINDEX, TROPONINI in the last 168 hours.  BNP (last 3  results) No results for input(s): PROBNP in the last 8760 hours.  HbA1C: No results for input(s): HGBA1C in the last 72 hours.  CBG: Recent Labs  Lab 01/20/18 2318  GLUCAP 170*    Lipid Profile: No results for input(s): CHOL, HDL, LDLCALC, TRIG, CHOLHDL, LDLDIRECT in the last 72 hours.  Thyroid Function Tests: No results for input(s): TSH, T4TOTAL, FREET4, T3FREE, THYROIDAB in the last 72 hours.  Anemia Panel: No results for input(s): VITAMINB12, FOLATE, FERRITIN, TIBC, IRON, RETICCTPCT in the last 72 hours.  Urine analysis:    Component Value Date/Time   COLORURINE YELLOW 01/20/2018 2311   APPEARANCEUR HAZY (A) 01/20/2018 2311   LABSPEC 1.019 01/20/2018 2311   PHURINE 5.0 01/20/2018 2311   GLUCOSEU NEGATIVE 01/20/2018 2311   HGBUR SMALL (A) 01/20/2018 2311   BILIRUBINUR NEGATIVE 01/20/2018 2311   KETONESUR NEGATIVE 01/20/2018 2311   PROTEINUR 30 (A) 01/20/2018 2311   UROBILINOGEN 1.0 10/06/2009 1406   NITRITE NEGATIVE 01/20/2018 2311   LEUKOCYTESUR NEGATIVE 01/20/2018 2311    Sepsis Labs: @LABRCNTIP (procalcitonin:4,lacticidven:4) )No results found for this or any previous visit (from the past 240 hour(s)).   Radiological Exams on Admission: Ct Angio Head W Or Wo Contrast  Result Date: 01/21/2018 CLINICAL DATA:  84 year old male with vertigo and acute left PICA territory infarct affecting left cerebellum on MRI earlier today. EXAM: CT ANGIOGRAPHY HEAD AND NECK TECHNIQUE: Multidetector CT imaging of the head and neck was performed using the standard protocol during bolus administration of intravenous contrast. Multiplanar CT image reconstructions and MIPs were obtained to evaluate the vascular anatomy. Carotid stenosis measurements (when applicable) are obtained utilizing NASCET criteria, using the distal internal carotid diameter as the denominator. CONTRAST:  33mL ISOVUE-370 IOPAMIDOL (ISOVUE-370) INJECTION 76% COMPARISON:  Brain MRI 0459 hours today. FINDINGS: CT HEAD  Brain: Confluent hypodensity in the medial inferior left cerebellum corresponding to the area of diffusion abnormality on MRI earlier today (series 5, image 6). No associated hemorrhage. No posterior fossa mass effect. The 4th ventricle is patent. Hypodensity in the supratentorial brain especially the left deep gray matter nuclei corresponds to chronic MRI signal today. No midline shift, ventriculomegaly, mass effect, evidence of mass lesion, intracranial hemorrhage. Calvarium and skull base: No acute osseous abnormality identified. Paranasal sinuses: Clear. Orbits: Negative orbit and scalp soft tissues. CTA NECK Skeleton: No acute osseous abnormality identified. Cervical spine degeneration. Incidental right maxillary sinus alveolar recess mucous retention cysts. Upper chest: Questionable trace layering right pleural effusion. Questionable mild centrilobular emphysema in the upper lungs. There is mild dependent atelectasis, and also mild apical septal thickening bilaterally. No superior mediastinal lymphadenopathy. Other neck: Negative.  No neck mass or lymphadenopathy. Aortic arch: Moderate Calcified aortic atherosclerosis. 3 vessel arch configuration. Right carotid system: No brachiocephalic or right CCA origin stenosis despite some soft plaque. Mild proximal right CCA tortuosity. Mild calcified and soft plaque at the right carotid bifurcation with no cervical right ICA stenosis. Left carotid system: No left CCA stenosis despite some plaque. Tortuous proximal left CCA. Minimal plaque at the left carotid bifurcation. Mild soft and calcified plaque in the distal left ICA bulb. No cervical left ICA stenosis. Vertebral arteries: No proximal right subclavian artery stenosis despite mild plaque. Normal right vertebral artery origin. Tortuous right V1 segment. Patent right vertebral artery to the skull base without stenosis. No proximal left subclavian artery stenosis despite soft and calcified plaque. The left vertebral  artery origin is normal (series 9 image 141). Mild to moderate tortuosity of the left V1 segment. Tortuous proximal left V2 segment. The left vertebral artery is patent to the skull base without stenosis. CTA HEAD Posterior circulation: The left vertebral V4 segment is patent but demonstrates areas of mild to moderate irregularity suspicious for atherosclerosis. No hemodynamically significant distal left vertebral stenosis results. The left PICA origin appears partially thrombosed on series 11, image 147, but there is some preserved PICA enhancement. There is mild to moderate irregularity also in the right vertebral artery V4 segment compatible with atherosclerosis, no significant stenosis. Patent right PICA origin. Patent vertebrobasilar junction. Patent basilar artery with mild irregularity, no stenosis. Normal SCA and PCA origins. Posterior communicating arteries are diminutive or absent. Mild bilateral PCA irregularity compatible with atherosclerosis. Preserved distal PCA enhancement. Anterior circulation: Patent left ICA siphon with mild to moderate calcified plaque beginning in the cavernous segment. There is only mild distal left cavernous and proximal supraclinoid left ICA stenosis. Normal left ophthalmic artery origin. The right ICA siphon is patent with similar moderate calcified plaque. There is mild right cavernous but moderate proximal right supraclinoid ICA stenosis (series 11, image 88 and series 13 image 66). Normal right ophthalmic artery origin. The bilateral carotid termini are patent. MCA and ACA origins are patent with mild irregularity. Mild ACA A1 segment irregularity. Anterior communicating artery is normal. There is moderate distal left A2 segment irregularity and stenosis (series 16, image 21). Other ACA branches are normal to mildly irregular. The left MCA M1 segment is mildly irregular. The left MCA bifurcation is patent. There is mild anterior M2 and moderate to severe anterior M3  segment irregularity with stenoses (series 16, image 28). Mild left MCA branch irregularity elsewhere. Right MCA M1 segment is mildly irregular and bifurcates early. There is up to moderate stenosis at the right MCA bifurcation best seen on series 16, image 15. Right MCA branches are mildly irregular. Venous sinuses: Patent. Anatomic variants: None. Delayed phase: No abnormal enhancement identified. The boundary of cytotoxic edema in the inferior left cerebellum is more apparent following contrast. Review of the MIP images confirms the above findings IMPRESSION: 1. Negative for large vessel occlusion. 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 2. There is little extracranial atherosclerosis, with no cervical carotid or vertebral artery stenosis. But there is moderate generalized intracranial atherosclerosis. Mostly mild intracranial arterial stenoses are noted - including in both vertebral artery V4 segments - with the following exceptions: - moderate right ICA siphon supraclinoid stenosis due to calcified plaque. - moderate right MCA bifurcation stenosis. - moderate left ACA A2 segment stenosis. - moderate and severe left MCA anterior division M3 stenoses. 3. Stable appearance of the acute left PICA infarct from the MRI earlier today. Cytotoxic edema with no hemorrhage or mass effect at this time. 4. Aortic Atherosclerosis (ICD10-I70.0). Electronically Signed   By: Genevie Ann M.D.   On: 01/21/2018 07:56   Ct Angio Neck W Or Wo Contrast  Result Date: 01/21/2018 CLINICAL DATA:  84 year old male with vertigo and acute left PICA territory infarct affecting left cerebellum on MRI earlier today. EXAM: CT ANGIOGRAPHY HEAD AND NECK TECHNIQUE: Multidetector CT imaging of the head and neck was performed using the standard protocol during bolus administration of intravenous contrast. Multiplanar CT image reconstructions and MIPs were obtained to evaluate the vascular anatomy. Carotid stenosis measurements  (when applicable) are obtained utilizing NASCET criteria, using the distal internal carotid diameter as the denominator. CONTRAST:  49mL ISOVUE-370 IOPAMIDOL (ISOVUE-370) INJECTION 76% COMPARISON:  Brain MRI 0459 hours today. FINDINGS: CT HEAD Brain: Confluent hypodensity in the medial inferior left cerebellum corresponding to the area of diffusion abnormality on MRI earlier today (series 5, image 6). No associated hemorrhage. No posterior fossa mass effect. The 4th ventricle is patent. Hypodensity in the supratentorial brain especially the left deep gray matter nuclei corresponds to chronic MRI signal today. No midline shift, ventriculomegaly, mass effect, evidence of mass lesion, intracranial hemorrhage. Calvarium and skull base: No acute osseous abnormality identified. Paranasal sinuses: Clear. Orbits: Negative orbit and scalp soft tissues. CTA NECK Skeleton: No acute osseous abnormality identified. Cervical spine degeneration. Incidental right maxillary sinus alveolar recess mucous retention cysts. Upper chest: Questionable trace layering right pleural effusion. Questionable mild centrilobular emphysema in the upper lungs. There is mild dependent atelectasis, and also mild apical septal thickening bilaterally. No superior mediastinal lymphadenopathy. Other neck: Negative.  No neck mass or lymphadenopathy. Aortic arch: Moderate Calcified aortic atherosclerosis. 3 vessel arch configuration. Right carotid system: No brachiocephalic or right CCA origin stenosis despite some soft plaque. Mild proximal right CCA tortuosity. Mild calcified and soft plaque at the right carotid bifurcation with no cervical right ICA stenosis. Left carotid system: No left CCA stenosis despite some plaque. Tortuous proximal left CCA. Minimal plaque at the left carotid bifurcation. Mild soft and calcified plaque in the distal left ICA bulb. No cervical left ICA stenosis. Vertebral arteries: No proximal right subclavian artery stenosis  despite mild plaque. Normal right vertebral artery origin. Tortuous right V1 segment. Patent right vertebral artery to the skull base without stenosis. No proximal left subclavian artery stenosis despite soft and calcified plaque. The left vertebral artery origin is normal (series 9 image 141). Mild to moderate tortuosity of the left V1 segment. Tortuous proximal left V2 segment. The left vertebral artery is patent to the skull base without stenosis. CTA HEAD Posterior circulation: The left vertebral V4 segment is patent but demonstrates areas of mild to moderate irregularity suspicious for atherosclerosis. No hemodynamically significant distal left vertebral stenosis results. The left PICA origin appears partially thrombosed on series 11, image 147, but there is some preserved PICA enhancement. There is mild to moderate irregularity also in the right vertebral artery V4 segment compatible with atherosclerosis, no significant stenosis. Patent right PICA origin. Patent vertebrobasilar junction. Patent basilar artery with mild irregularity, no stenosis. Normal SCA and PCA origins. Posterior communicating arteries are diminutive or absent. Mild bilateral PCA irregularity compatible with atherosclerosis. Preserved distal PCA enhancement. Anterior circulation: Patent left ICA siphon with mild to moderate calcified plaque beginning in the cavernous segment. There is only mild distal left cavernous and proximal supraclinoid left ICA stenosis. Normal left ophthalmic artery origin. The right ICA siphon is patent with similar moderate calcified plaque. There is mild right cavernous but moderate proximal right supraclinoid ICA stenosis (series 11, image 88 and series 13 image 66). Normal right ophthalmic artery origin. The bilateral carotid termini are patent. MCA and ACA origins are patent with mild irregularity. Mild ACA A1 segment irregularity. Anterior communicating artery is normal. There is moderate distal left A2  segment irregularity and stenosis (series 16, image 21). Other ACA branches are normal to mildly irregular. The left MCA M1 segment is mildly irregular. The left MCA bifurcation is patent. There is mild anterior M2 and moderate to severe anterior M3 segment irregularity with stenoses (series 16, image 28). Mild left MCA branch irregularity elsewhere. Right MCA M1 segment is mildly irregular and bifurcates early.  There is up to moderate stenosis at the right MCA bifurcation best seen on series 16, image 15. Right MCA branches are mildly irregular. Venous sinuses: Patent. Anatomic variants: None. Delayed phase: No abnormal enhancement identified. The boundary of cytotoxic edema in the inferior left cerebellum is more apparent following contrast. Review of the MIP images confirms the above findings IMPRESSION: 1. Negative for large vessel occlusion. 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 2. There is little extracranial atherosclerosis, with no cervical carotid or vertebral artery stenosis. But there is moderate generalized intracranial atherosclerosis. Mostly mild intracranial arterial stenoses are noted - including in both vertebral artery V4 segments - with the following exceptions: - moderate right ICA siphon supraclinoid stenosis due to calcified plaque. - moderate right MCA bifurcation stenosis. - moderate left ACA A2 segment stenosis. - moderate and severe left MCA anterior division M3 stenoses. 3. Stable appearance of the acute left PICA infarct from the MRI earlier today. Cytotoxic edema with no hemorrhage or mass effect at this time. 4. Aortic Atherosclerosis (ICD10-I70.0). Electronically Signed   By: Genevie Ann M.D.   On: 01/21/2018 07:56   Mr Brain Wo Contrast  Result Date: 01/21/2018 CLINICAL DATA:  Persistent central vertigo.  Near syncope. EXAM: MRI HEAD WITHOUT CONTRAST TECHNIQUE: Multiplanar, multiecho pulse sequences of the brain and surrounding structures were obtained without  intravenous contrast. COMPARISON:  None. FINDINGS: BRAIN: The midline structures are normal. Abnormal diffusion restriction within the inferior left cerebellar hemisphere, in the distribution of the posterior inferior cerebellar artery. No mass effect or acute hemorrhage. Fourth ventricle remains widely patent. Old left basal ganglia and corona radiata lacunar infarcts. Mild periventricular white matter hyperintensity. No age-advanced or lobar predominant atrophy. There is blooming on susceptibility weighted imaging in the area of the left posterior inferior cerebellar artery, which may indicate thrombus. VASCULAR: Major intracranial arterial and venous sinus flow voids are preserved. SKULL AND UPPER CERVICAL SPINE: The visualized skull base, calvarium, upper cervical spine and extracranial soft tissues are normal. SINUSES/ORBITS: Small maxillary retention cysts. No mastoid or middle ear effusion. Normal orbits. IMPRESSION: 1. Acute left posterior inferior cerebellar artery territory infarct without hemorrhage or mass effect 2. Chronic small vessel disease with old left basal ganglia and corona radiata lacunar infarcts. Electronically Signed   By: Ulyses Jarred M.D.   On: 01/21/2018 05:32   Dg Chest Portable 1 View  Result Date: 01/21/2018 CLINICAL DATA:  Dyspnea and hypoxia. EXAM: PORTABLE CHEST 1 VIEW COMPARISON:  12/07/2017 FINDINGS: Cardiomegaly with aortic atherosclerosis. Mild central vascular congestion superimposed on chronic interstitial lung disease. No effusion or pulmonary consolidation. No pneumothorax. Degenerative changes are seen about both shoulders and dorsal spine. IMPRESSION: Cardiomegaly with aortic atherosclerosis.  Mild CHF. Electronically Signed   By: Ashley Royalty M.D.   On: 01/21/2018 01:48    EKG: Independently reviewed.EKG shows sinus rhythm, frequent PVCs, possible LVH, no significant change since last tracing.  Assessment/Plan Principal Problem:   Acute CVA (cerebrovascular  accident) ([place]) Active Problems:   History of CVA (cerebrovascular accident) without residual deficits   Hypertension, benign   Hypercholesteremia   Subclinical hypothyroidism   Palpitations   Acute combined systolic and diastolic congestive heart failure (HCC)   PVC's (premature ventricular contractions)   Coronary artery disease involving native coronary artery of native heart with unstable angina pectoris ([place])   BPH with elevated PSA   Bradycardia     Acute Cerebellar stroke in the [facility] territory.  History of stroke 10 years ago  with mild residual dysarthria. Presented with vertigo and L sided tingling . Neurology consultation was obtained. On presentation no TPA was given, as the patient is outside the window, drinking scale is 1.  Per Neurology consultation report, although the etiology is unknown, embolic source is suspected, given his history of PVCs.  A new 2D echo has been ordered for further review. CT Angio of the head and neck neg for large vessel occlusion.  He received Antivert with improvement of his vertigo, and Zofran for nausea. Also received aspirin 325 at the ER. Received Antivert and Zofran, currently symptom free. Passed swallow eval.   Admit to Tele / Inpatient Stroke order set  Follow CTA neck and Echo results  Allow permissive HTN PT/OT/SLP  lipid panel A1C Aspirin  Lipitor 80 mg daily Neuro  Following, appreciate their involvement Continue Antivert for vertigo, and Zofran   History of PVCS, followed at the [place] office, EKG today shows sinus rhythm, frequent PVCs, possible LVH, no significant change since last tracing. Rate in the ER has been in the  Low 40s Follow echo results Of note, Coreg, Metoprolol for at least 24 h  For permissive HTN, but  due to bradycardia, if no improvement, will obtain Cards evaluation, meds adjustment Etiology-currently unknown-suspect an embolic source-given history of PVCs, will need further work-up for evaluation for  underlying paroxysmal A. Fib  Chronic Systolic and Diastolic CHF:   Weight 063KZS, 192 is his baseline    BNP is 2431.3 suggestive of acute on chronic heart failure Troponin is negative at 0.01. Received Lasix 20 mg IV at the ED. BB, Coreg on Hold   Obtain daily weights Monitor intake and output Lasix 20 mg IV bid  Echo pending, if any changes seen will consult Cards  Continue to hold Coreg and BB for now    CAD,  last cardiac catheterization in 2017 , patient is cardiac pain free at this time.Troponin is negative at 0.01. EKG shows sinus rhythm, frequent PVCs, possible LVH, no significant change since last tracing. Continue ASA,  high dose statin, BB on hold  Follow with Cards as OP unless develops CP   Chronic kidney disease stage 2  cr 1.29 at baseline Lab Results  Component Value Date   CREATININE 1.29 (H) 01/20/2018   CREATININE 1.27 (H) 12/11/2017   CREATININE 1.19 (H) 11/09/2017   Repeat BMET in am      Elevated Glucose Patient noted to have elevated Glucose in labs at 129 fasting . No prior documented history of DM Check A1C Check CBG bid     Hypertension BP 131/66   Pulse (!) 39   Temp (!) 97 F (36.1 C) (Axillary)   Resp 19   SpO2 98%  Continue to hold meds, to allow permissive HTN Add Hydralazine Q6 hours as needed for BP 210/110-  Hyperlipidemia Continue home statins, increase lipitor to 80 mg daily   Benign prostatic hypertrophy, no acute issues Continue FLomax     DVT prophylaxis:  Heparin  Code Status:    Full  Family Communication:  Discussed with patient Disposition Plan: Expect patient to be discharged to home after condition improves Consults called:    Neuro, Dr. Rory Percy Admission status: IP Tele   Sharene Butters, PA-C [facility]   Amion text  [phone]   01/21/2018, 8:08 AM

## 2018-01-21 NOTE — ED Notes (Signed)
Heart healthy breakfast ordered for pt

## 2018-01-21 NOTE — Consult Note (Addendum)
Neurology Consultation  Reason for Consult: stroke Referring Physician: Dr Leonides Schanz  CC: Vertigo  History is obtained from: Patient, chart, family at bedside  HPI: Patrick Maxwell is a 84 y.o. 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  He describes that as room spinning.  He was unable to maintain his balance.  He tried to stand up but could not stand or walk.  He had to crawl out of his bed to come to the door to call out for help from his wife.  He did not have any chest pain at that time.  He denied any headaches.  He denied any nausea vomiting.  Denies any loss of consciousness or lightheadedness. The symptoms have since improved but have not gone away. Continues to have room spinning sensation.  Was very ataxic on walking for report.  Has not been walked for many hours now. Denies any similar symptoms in the past denies any history suggestive of BPPV. Denies preceding illnesses, sicknesses, fevers, chills, upper respiratory infection, ear infection. Also of note, he is a patient of cardiology service as an outpatient.  He has been followed with cardiac monitoring for frequent PVCs.  Denies any current palpitations.  LKW: 8 PM on 01/20/2018 tpa given?: no, outside the window Premorbid modified Rankin scale (mRS): 1  ROS: ROS was performed and is negative except as noted in the HPI. *   Past Medical History:  Diagnosis Date  . CAD (coronary artery disease)   . CHF (congestive heart failure) ([place])   . Frequent unifocal PVCs    Palpitations since early 20s, PVCs and pairs  . Hyperlipidemia   . Hypertension   . Stroke [facility]) 2009    Family History  Problem Relation Age of Onset  . Stroke Mother   . Early death Mother   . Cancer Brother     Social History:   reports that he has never smoked. He has never used smokeless tobacco. He reports that he does not drink alcohol or use drugs.  Medications No current facility-administered medications for this encounter.   Current Outpatient Medications:  .  Ascorbic Acid (VITAMIN C PO), Take 250 mg by mouth 3 (three) times a week. , Disp: , Rfl:  .  aspirin 81 MG tablet, Take 81 mg by mouth daily. , Disp: , Rfl:  .  carvedilol (COREG) 3.125 MG tablet, Take 1 tablet (3.125 mg total) by mouth 2 (two) times daily., Disp: 180 tablet, Rfl: 3 .  Cholecalciferol (VITAMIN D PO), Take 5,000 Units by mouth daily., Disp: , Rfl:  .  Coenzyme Q10 (COQ10) 100 MG CAPS, Take 1 capsule by mouth daily., Disp: , Rfl:  .  furosemide (LASIX) 20 MG tablet, TAKE ONE TABLET BY MOUTH ONCE DAILY, Disp: 90 tablet, Rfl: 1 .  losartan (COZAAR) 25 MG tablet, Take 0.5 tablets (12.5 mg total) by mouth daily., Disp: 90 tablet, Rfl: 3 .  Magnesium 100 MG TABS, Take 1 tablet by mouth 2 (two) times daily., Disp: , Rfl:  .  Multiple Vitamin (MULTIVITAMIN WITH MINERALS) TABS tablet, Take 1 tablet by mouth daily., Disp: , Rfl:  .  nitroGLYCERIN (NITROSTAT) 0.4 MG SL tablet, Place 1 tablet (0.4 mg total) under the tongue every 5 (five) minutes as  needed., Disp: 25 tablet, Rfl: 3 .  Potassium 99 MG TABS, Take 99 mg by mouth daily. , Disp: , Rfl:  .  tamsulosin (FLOMAX) 0.4 MG CAPS capsule, Take 1 capsule (0.4 mg total) by mouth daily., Disp: 30 capsule, Rfl: 3 .  vitamin E 400 UNIT capsule, Take 400 Units by mouth 3 (three) times a week., Disp: , Rfl:  .  atorvastatin (LIPITOR) 20 MG tablet, Take 1 tablet (20 mg total) by mouth daily. (Patient not taking: Reported on 01/21/2018), Disp: 90 tablet, Rfl: 3 .  benzonatate (TESSALON) 100 MG capsule, Take 1 capsule (100 mg total) by mouth 3 (three) times daily as needed for cough. (Patient not taking: Reported on 01/21/2018), Disp: 20 capsule, Rfl: 0  Exam: Current vital signs: BP (!)  141/93   Pulse (!) 46   Temp 97.6 F (36.4 C) (Oral)   Resp 18   SpO2 99%  Vital signs in last 24 hours: Temp:  [97.6 F (36.4 C)] 97.6 F (36.4 C) (05/01 2316) Pulse Rate:  [42-52] 46 (05/02 0600) Resp:  [18-23] 18 (05/02 0600) BP: (141-163)/(83-114) 141/93 (05/02 0600) SpO2:  [92 %-99 %] 99 % (05/02 0600)  GENERAL: Awake, alert in NAD HEENT: - Normocephalic and atraumatic, dry mm, no LN++, no Thyromegally LUNGS - Clear to auscultation bilaterally with no wheezes CV - S1S2 RRR, no m/r/g, equal pulses bilaterally. ABDOMEN - Soft, nontender, nondistended with normoactive BS Ext: warm, well perfused, intact peripheral pulses, no edema  NEURO:  Mental Status: AA&Ox3  Language: speech is mildly dysarthric.  Naming, repetition, fluency, and comprehension intact. Cranial Nerves: PERRL. EOMI without nystagmus, visual fields full, no facial asymmetry, facial sensation intact, hearing intact, tongue/uvula/soft palate midline, normal sternocleidomastoid and trapezius muscle srength. No evidence of tongue atrophy or fibrillations Motor: Symmetric 5/5 with no vertical drift in all 4 extremities. Tone: is normal and bulk is normal Sensation- Intact to light touch bilaterally Coordination: FTN intact bilaterally, no ataxia in BLE. Gait-was not tested but per report was very ataxic while walking.  NIHSS-1 for dysarthria   Labs I have reviewed labs in epic and the results pertinent to this consultation are:  CBC    Component Value Date/Time   WBC 11.6 (H) 01/20/2018 2324   RBC 5.00 01/20/2018 2324   HGB 14.5 01/20/2018 2324   HCT 43.4 01/20/2018 2324   PLT 139 (L) 01/20/2018 2324   MCV 86.8 01/20/2018 2324   MCH 29.0 01/20/2018 2324   MCHC 33.4 01/20/2018 2324   RDW 15.8 (H) 01/20/2018 2324   LYMPHSABS 1,214 11/09/2017 0841   MONOABS 850 12/30/2016 0818   EOSABS 256 11/09/2017 0841   BASOSABS 49 11/09/2017 0841    CMP     Component Value Date/Time   NA 139 01/20/2018 2324    K 4.1 01/20/2018 2324   CL 108 01/20/2018 2324   CO2 21 (L) 01/20/2018 2324   GLUCOSE 215 (H) 01/20/2018 2324   BUN 22 (H) 01/20/2018 2324   CREATININE 1.29 (H) 01/20/2018 2324   CREATININE 1.27 (H) 12/11/2017 1204   CALCIUM 8.9 01/20/2018 2324   PROT 6.7 11/09/2017 0841   ALBUMIN 4.1 12/30/2016 0818   AST 22 11/09/2017 0841   ALT 18 11/09/2017 0841   ALKPHOS 62 12/30/2016 0818   BILITOT 0.9 11/09/2017 0841   GFRNONAA 49 (L) 01/20/2018 2324   GFRNONAA 50 (L) 12/30/2016 0818   GFRAA 57 (L) 01/20/2018 2324   GFRAA 58 (L) 12/30/2016 0818    Lipid Panel  Component Value Date/Time   CHOL 178 11/09/2017 0841   TRIG 90 11/09/2017 0841   HDL 45 11/09/2017 0841   CHOLHDL 4.0 11/09/2017 0841   VLDL 20 12/30/2016 0818   LDLCALC 114 (H) 11/09/2017 0841   Imaging I have reviewed the images obtained: MRI examination of the brain-restricted diffusion in the left PICA territory without hemorrhage or mass-effect.  Chronic small vessel disease and old left basal ganglia and corona radiata lacunar infarcts.  Assessment:  84 year old man with hypertension, hyperlipidemia, CHF, prior stroke with some residual speech deficit that is minimal, presented with acute onset of vertigo. MRI reveals an acute cerebellar stroke in the left PICA territory. Etiology-currently unknown-suspect an embolic source-given history of PVCs, will need further work-up for evaluation for underlying paroxysmal A. Fib  Outside window for IV TPA  Impression: Acute ischemic stroke- cerebellum-left PICA territory-etiology under investigation Hypertension Hyperlipidemia CHF  Recommendations: -Admit to telemetry. Consider moving to stepdown if there is any neurological deterioration. No evidence of hydro or mass effect from stroke on most recent MRI. Consider repeat head CT if there is any neurological deterioration. -Frequent neurochecks -CT angiogram head and neck -2D echocardiogram -A1c -Lipid  panel -Aspirin 325 -Atorvastatin 80 mg -PT, OT, speech therapy evaluations -Allow for permissive hypertension for the first 24 to 48 hours.  Treat only if systolics greater than 627. -Will likely need longer-term cardiac monitoring if telemetry does not reveal A. Fib.  Please page stroke NP/PA/MD (listed on AMION)  from 8am-4 pm as this patient will be followed by the stroke team at this point.  -- Amie Portland, MD [facility] Pager: [phone] If 7pm to 7am, please call on call as listed on AMION.

## 2018-01-22 LAB — LIPID PANEL
Cholesterol: 151 mg/dL (ref 0–200)
HDL: 41 mg/dL (ref >40)
LDL CALC: 97 mg/dL (ref 0–99)
Total CHOL/HDL Ratio: 3.7 RATIO
Triglycerides: 64 mg/dL (ref <150)
VLDL: 13 mg/dL (ref 0–40)

## 2018-01-22 LAB — HEMOGLOBIN A1C
Hgb A1c MFr Bld: 5.7 % — ABNORMAL HIGH (ref 4.8–5.6)
Mean Plasma Glucose: 116.89 mg/dL

## 2018-01-22 MED ORDER — CLOPIDOGREL BISULFATE 75 MG PO TABS
75.0000 mg | ORAL_TABLET | Freq: Every day | ORAL | Status: DC
  Administered 2018-01-23 – 2018-01-25 (×3): 75 mg via ORAL
  Filled 2018-01-22 (×3): qty 1

## 2018-01-22 MED ORDER — ASPIRIN EC 81 MG PO TBEC
81.0000 mg | DELAYED_RELEASE_TABLET | Freq: Every day | ORAL | Status: DC
  Administered 2018-01-23 – 2018-01-25 (×3): 81 mg via ORAL
  Filled 2018-01-22 (×3): qty 1

## 2018-01-22 MED ORDER — ATORVASTATIN CALCIUM 40 MG PO TABS
40.0000 mg | ORAL_TABLET | Freq: Every day | ORAL | Status: DC
  Administered 2018-01-22 – 2018-01-24 (×3): 40 mg via ORAL
  Filled 2018-01-22 (×4): qty 1

## 2018-01-22 MED ORDER — CLOPIDOGREL BISULFATE 75 MG PO TABS
300.0000 mg | ORAL_TABLET | Freq: Once | ORAL | Status: AC
  Administered 2018-01-22: 300 mg via ORAL
  Filled 2018-01-22: qty 4

## 2018-01-22 MED ORDER — ATORVASTATIN CALCIUM 40 MG PO TABS: 40.0000 mg | ORAL_TABLET | Freq: Every day | ORAL | Status: DC

## 2018-01-22 NOTE — Progress Notes (Signed)
Rehab Admissions Coordinator Note:  Patient was screened by Cleatrice Burke for appropriateness for an Inpatient Acute Rehab Consult per OT recommendation. PT eval pending . At this time, we are recommending await PT eval, and if inpt rehab consult recommended and family wishes to pursue admit, please place order for rehab consult. I will follow.  Cleatrice Burke 01/22/2018, 11:27 AM  I can be reached at [phone].

## 2018-01-22 NOTE — Progress Notes (Signed)
PROGRESS NOTE  Patrick Maxwell KGU:[id number] DOB: February 27, 1931 DOA: 01/20/2018 PCP: [name], MD   LOS: 1 day   Brief Narrative / Interim history: 84 y.o. male with a history of CAD with last cardiac catheterization May 2017, ischemic cardiomyopathy with chronic systolic heart failure with with last echo in November 13, 2017 showing ejection fraction 35 to 40%, history of bradycardia, history of PVCs PACs low at heart care, Dr. Serita Sheller, hypertension, hyperlipidemia last LDL in February 2019 at 114, history of CVA 10 years ago, prior to the emergency department in for evaluation of the onset of vertigo, and left-sided tingling.  He was found to have a CVA and was admitted to the hospital  Assessment & Plan: Principal Problem:   Acute CVA (cerebrovascular accident) [facility]) Active Problems:   History of CVA (cerebrovascular accident) without residual deficits   Hypertension, benign   Hypercholesteremia   Subclinical hypothyroidism   Palpitations   Acute combined systolic and diastolic congestive heart failure (HCC)   PVC's (premature ventricular contractions)   Coronary artery disease involving native coronary artery of native heart with unstable angina pectoris ([place])   BPH with elevated PSA   Bradycardia   Acute CVA -Neurology consulted, appreciate input.  MRI on admission showed acute left posterior inferior cerebral artery territory infarct -CT angiogram negative for large vessel occlusion, the left PICA is partially thrombosed beginning at its origin -2D echo completed on 5/2 showed an EF of 30 to 35%, diffuse hypokinesis -Neurology recommends a TEE as well as a loop recorder, both can happen as an outpatient, discussed with Dr. Leonie Man -LDL 97, high-dose statin -A1c 5.7 -Placed on aspirin and Plavix for 3 weeks, followed by Plavix alone  History of premature ventricular contractions  -followed by cardiology, as above will need loop recorder, will also evaluate for  underlying paroxysmal A. Fib  Chronic combined systolic and diastolic CHF -Appears euvolemic, troponin negative -At home he is on Coreg, Lasix, Cozaar, hold Lasix and Cozaar 12 permissive hypertension  Coronary artery disease/ischemic cardiomyopathy -No chest pain, continue aspirin, Plavix, high-dose statin.  Chronic kidney disease stage II -Creatinine appears at baseline  Hypertension -Allow permissive hypertension  Hyperlipidemia -High-dose statin as above  BPH -Continue Flomax  DVT prophylaxis: Heparin Code Status: Full code Family Communication: Discussed with wife as well as 2 sons at bedside Disposition Plan: Home when ready   Consultants:   Neurology  Procedures:   2D echo:  Study Conclusions - Left ventricle: The cavity size was moderately dilated. Wall thickness was normal. Systolic function was moderately to severely reduced. The estimated ejection fraction was in the range of 30% to 35%. Diffuse hypokinesis. Cannot exclude apical thrombus on non-contrast image. The study is not technically sufficient to allow evaluation of LV diastolic function. - Aortic valve: Trileaflet. Sclerosis without stenosis. There was trivial regurgitation. - Mitral valve: Mildly thickened leaflets . There was moderate regurgitation. - Left atrium: The atrium was mildly dilated. - Right ventricle: The cavity size was mildly dilated. - Tricuspid valve: There was mild regurgitation. - Pulmonary arteries: PA peak pressure: 59 mm Hg (S). - Inferior vena cava: The vessel was dilated. The respirophasic diameter changes were blunted (< 50%), consistent with elevated central venous pressure.   Antimicrobials:  None    Subjective: - no chest pain, shortness of breath, no abdominal pain, nausea or vomiting. Wants to go home   Objective: Vitals:   01/22/18 0046 01/22/18 0436 01/22/18 0943 01/22/18 1223  BP: 119/69 124/60  135/77  Pulse: (!) 45 (!) 41 (!) 45 (!) 45  Resp: 18 16  17     Temp:  97.8 F (36.6 C)  97.6 F (36.4 C)  TempSrc:  Oral  Axillary  SpO2: 91% 94%  92%  Weight:      Height:        Intake/Output Summary (Last 24 hours) at 01/22/2018 1338 Last data filed at 01/21/2018 1758 Gross per 24 hour  Intake 240 ml  Output 300 ml  Net -60 ml   Filed Weights   01/21/18 1737  Weight: 86.4 kg (190 lb 7.6 oz)    Examination:  Constitutional: NAD Eyes: PERRL, lids and conjunctivae normal ENMT: Mucous membranes are moist.  Respiratory: clear to auscultation bilaterally, no wheezing, no crackles. Normal respiratory effort.  Cardiovascular: Regular rate and rhythm, no murmurs / rubs / gallops. No LE edema. Abdomen: no tenderness. Bowel sounds positive.  Musculoskeletal: no clubbing / cyanosis.  Skin: no rashes Neurologic: CN 2-12 grossly intact. Strength 5/5 in all 4.    Data Reviewed: I have independently reviewed following labs and imaging studies   CBC: Recent Labs  Lab 01/20/18 2324  WBC 11.6*  HGB 14.5  HCT 43.4  MCV 86.8  PLT 846*   Basic Metabolic Panel: Recent Labs  Lab 01/20/18 2324 01/21/18 0030  NA 139  --   K 4.1  --   CL 108  --   CO2 21*  --   GLUCOSE 215*  --   BUN 22*  --   CREATININE 1.29*  --   CALCIUM 8.9  --   MG  --  1.9   GFR: Estimated Creatinine Clearance: 45.4 mL/min (A) (by C-G formula based on SCr of 1.29 mg/dL (H)). Liver Function Tests: No results for input(s): AST, ALT, ALKPHOS, BILITOT, PROT, ALBUMIN in the last 168 hours. No results for input(s): LIPASE, AMYLASE in the last 168 hours. No results for input(s): AMMONIA in the last 168 hours. Coagulation Profile: No results for input(s): INR, PROTIME in the last 168 hours. Cardiac Enzymes: No results for input(s): CKTOTAL, CKMB, CKMBINDEX, TROPONINI in the last 168 hours. BNP (last 3 results) No results for input(s): PROBNP in the last 8760 hours. HbA1C: Recent Labs    01/22/18 0720  HGBA1C 5.7*   CBG: Recent Labs  Lab 01/20/18 2318   GLUCAP 170*   Lipid Profile: Recent Labs    01/22/18 0720  CHOL 151  HDL 41  LDLCALC 97  TRIG 64  CHOLHDL 3.7   Thyroid Function Tests: No results for input(s): TSH, T4TOTAL, FREET4, T3FREE, THYROIDAB in the last 72 hours. Anemia Panel: No results for input(s): VITAMINB12, FOLATE, FERRITIN, TIBC, IRON, RETICCTPCT in the last 72 hours. Urine analysis:    Component Value Date/Time   COLORURINE YELLOW 01/20/2018 2311   APPEARANCEUR HAZY (A) 01/20/2018 2311   LABSPEC 1.019 01/20/2018 2311   PHURINE 5.0 01/20/2018 2311   GLUCOSEU NEGATIVE 01/20/2018 2311   HGBUR SMALL (A) 01/20/2018 2311   BILIRUBINUR NEGATIVE 01/20/2018 2311   KETONESUR NEGATIVE 01/20/2018 2311   PROTEINUR 30 (A) 01/20/2018 2311   UROBILINOGEN 1.0 10/06/2009 1406   NITRITE NEGATIVE 01/20/2018 2311   LEUKOCYTESUR NEGATIVE 01/20/2018 2311   Sepsis Labs: Invalid input(s): PROCALCITONIN, LACTICIDVEN  No results found for this or any previous visit (from the past 240 hour(s)).    Radiology Studies: Ct Angio Head W Or Wo Contrast  Result Date: 01/21/2018 CLINICAL DATA:  84 year old male with vertigo and acute left PICA territory infarct  affecting left cerebellum on MRI earlier today. EXAM: CT ANGIOGRAPHY HEAD AND NECK TECHNIQUE: Multidetector CT imaging of the head and neck was performed using the standard protocol during bolus administration of intravenous contrast. Multiplanar CT image reconstructions and MIPs were obtained to evaluate the vascular anatomy. Carotid stenosis measurements (when applicable) are obtained utilizing NASCET criteria, using the distal internal carotid diameter as the denominator. CONTRAST:  57mL ISOVUE-370 IOPAMIDOL (ISOVUE-370) INJECTION 76% COMPARISON:  Brain MRI 0459 hours today. FINDINGS: CT HEAD Brain: Confluent hypodensity in the medial inferior left cerebellum corresponding to the area of diffusion abnormality on MRI earlier today (series 5, image 6). No associated hemorrhage. No  posterior fossa mass effect. The 4th ventricle is patent. Hypodensity in the supratentorial brain especially the left deep gray matter nuclei corresponds to chronic MRI signal today. No midline shift, ventriculomegaly, mass effect, evidence of mass lesion, intracranial hemorrhage. Calvarium and skull base: No acute osseous abnormality identified. Paranasal sinuses: Clear. Orbits: Negative orbit and scalp soft tissues. CTA NECK Skeleton: No acute osseous abnormality identified. Cervical spine degeneration. Incidental right maxillary sinus alveolar recess mucous retention cysts. Upper chest: Questionable trace layering right pleural effusion. Questionable mild centrilobular emphysema in the upper lungs. There is mild dependent atelectasis, and also mild apical septal thickening bilaterally. No superior mediastinal lymphadenopathy. Other neck: Negative.  No neck mass or lymphadenopathy. Aortic arch: Moderate Calcified aortic atherosclerosis. 3 vessel arch configuration. Right carotid system: No brachiocephalic or right CCA origin stenosis despite some soft plaque. Mild proximal right CCA tortuosity. Mild calcified and soft plaque at the right carotid bifurcation with no cervical right ICA stenosis. Left carotid system: No left CCA stenosis despite some plaque. Tortuous proximal left CCA. Minimal plaque at the left carotid bifurcation. Mild soft and calcified plaque in the distal left ICA bulb. No cervical left ICA stenosis. Vertebral arteries: No proximal right subclavian artery stenosis despite mild plaque. Normal right vertebral artery origin. Tortuous right V1 segment. Patent right vertebral artery to the skull base without stenosis. No proximal left subclavian artery stenosis despite soft and calcified plaque. The left vertebral artery origin is normal (series 9 image 141). Mild to moderate tortuosity of the left V1 segment. Tortuous proximal left V2 segment. The left vertebral artery is patent to the skull base  without stenosis. CTA HEAD Posterior circulation: The left vertebral V4 segment is patent but demonstrates areas of mild to moderate irregularity suspicious for atherosclerosis. No hemodynamically significant distal left vertebral stenosis results. The left PICA origin appears partially thrombosed on series 11, image 147, but there is some preserved PICA enhancement. There is mild to moderate irregularity also in the right vertebral artery V4 segment compatible with atherosclerosis, no significant stenosis. Patent right PICA origin. Patent vertebrobasilar junction. Patent basilar artery with mild irregularity, no stenosis. Normal SCA and PCA origins. Posterior communicating arteries are diminutive or absent. Mild bilateral PCA irregularity compatible with atherosclerosis. Preserved distal PCA enhancement. Anterior circulation: Patent left ICA siphon with mild to moderate calcified plaque beginning in the cavernous segment. There is only mild distal left cavernous and proximal supraclinoid left ICA stenosis. Normal left ophthalmic artery origin. The right ICA siphon is patent with similar moderate calcified plaque. There is mild right cavernous but moderate proximal right supraclinoid ICA stenosis (series 11, image 88 and series 13 image 66). Normal right ophthalmic artery origin. The bilateral carotid termini are patent. MCA and ACA origins are patent with mild irregularity. Mild ACA A1 segment irregularity. Anterior communicating artery is normal. There is moderate  distal left A2 segment irregularity and stenosis (series 16, image 21). Other ACA branches are normal to mildly irregular. The left MCA M1 segment is mildly irregular. The left MCA bifurcation is patent. There is mild anterior M2 and moderate to severe anterior M3 segment irregularity with stenoses (series 16, image 28). Mild left MCA branch irregularity elsewhere. Right MCA M1 segment is mildly irregular and bifurcates early. There is up to moderate  stenosis at the right MCA bifurcation best seen on series 16, image 15. Right MCA branches are mildly irregular. Venous sinuses: Patent. Anatomic variants: None. Delayed phase: No abnormal enhancement identified. The boundary of cytotoxic edema in the inferior left cerebellum is more apparent following contrast. Review of the MIP images confirms the above findings IMPRESSION: 1. Negative for large vessel occlusion. 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 2. There is little extracranial atherosclerosis, with no cervical carotid or vertebral artery stenosis. But there is moderate generalized intracranial atherosclerosis. Mostly mild intracranial arterial stenoses are noted - including in both vertebral artery V4 segments - with the following exceptions: - moderate right ICA siphon supraclinoid stenosis due to calcified plaque. - moderate right MCA bifurcation stenosis. - moderate left ACA A2 segment stenosis. - moderate and severe left MCA anterior division M3 stenoses. 3. Stable appearance of the acute left PICA infarct from the MRI earlier today. Cytotoxic edema with no hemorrhage or mass effect at this time. 4. Aortic Atherosclerosis (ICD10-I70.0). Electronically Signed   By: Genevie Ann M.D.   On: 01/21/2018 07:56   Ct Angio Neck W Or Wo Contrast  Result Date: 01/21/2018 CLINICAL DATA:  84 year old male with vertigo and acute left PICA territory infarct affecting left cerebellum on MRI earlier today. EXAM: CT ANGIOGRAPHY HEAD AND NECK TECHNIQUE: Multidetector CT imaging of the head and neck was performed using the standard protocol during bolus administration of intravenous contrast. Multiplanar CT image reconstructions and MIPs were obtained to evaluate the vascular anatomy. Carotid stenosis measurements (when applicable) are obtained utilizing NASCET criteria, using the distal internal carotid diameter as the denominator. CONTRAST:  58mL ISOVUE-370 IOPAMIDOL (ISOVUE-370) INJECTION 76%  COMPARISON:  Brain MRI 0459 hours today. FINDINGS: CT HEAD Brain: Confluent hypodensity in the medial inferior left cerebellum corresponding to the area of diffusion abnormality on MRI earlier today (series 5, image 6). No associated hemorrhage. No posterior fossa mass effect. The 4th ventricle is patent. Hypodensity in the supratentorial brain especially the left deep gray matter nuclei corresponds to chronic MRI signal today. No midline shift, ventriculomegaly, mass effect, evidence of mass lesion, intracranial hemorrhage. Calvarium and skull base: No acute osseous abnormality identified. Paranasal sinuses: Clear. Orbits: Negative orbit and scalp soft tissues. CTA NECK Skeleton: No acute osseous abnormality identified. Cervical spine degeneration. Incidental right maxillary sinus alveolar recess mucous retention cysts. Upper chest: Questionable trace layering right pleural effusion. Questionable mild centrilobular emphysema in the upper lungs. There is mild dependent atelectasis, and also mild apical septal thickening bilaterally. No superior mediastinal lymphadenopathy. Other neck: Negative.  No neck mass or lymphadenopathy. Aortic arch: Moderate Calcified aortic atherosclerosis. 3 vessel arch configuration. Right carotid system: No brachiocephalic or right CCA origin stenosis despite some soft plaque. Mild proximal right CCA tortuosity. Mild calcified and soft plaque at the right carotid bifurcation with no cervical right ICA stenosis. Left carotid system: No left CCA stenosis despite some plaque. Tortuous proximal left CCA. Minimal plaque at the left carotid bifurcation. Mild soft and calcified plaque in the distal left ICA bulb. No  cervical left ICA stenosis. Vertebral arteries: No proximal right subclavian artery stenosis despite mild plaque. Normal right vertebral artery origin. Tortuous right V1 segment. Patent right vertebral artery to the skull base without stenosis. No proximal left subclavian artery  stenosis despite soft and calcified plaque. The left vertebral artery origin is normal (series 9 image 141). Mild to moderate tortuosity of the left V1 segment. Tortuous proximal left V2 segment. The left vertebral artery is patent to the skull base without stenosis. CTA HEAD Posterior circulation: The left vertebral V4 segment is patent but demonstrates areas of mild to moderate irregularity suspicious for atherosclerosis. No hemodynamically significant distal left vertebral stenosis results. The left PICA origin appears partially thrombosed on series 11, image 147, but there is some preserved PICA enhancement. There is mild to moderate irregularity also in the right vertebral artery V4 segment compatible with atherosclerosis, no significant stenosis. Patent right PICA origin. Patent vertebrobasilar junction. Patent basilar artery with mild irregularity, no stenosis. Normal SCA and PCA origins. Posterior communicating arteries are diminutive or absent. Mild bilateral PCA irregularity compatible with atherosclerosis. Preserved distal PCA enhancement. Anterior circulation: Patent left ICA siphon with mild to moderate calcified plaque beginning in the cavernous segment. There is only mild distal left cavernous and proximal supraclinoid left ICA stenosis. Normal left ophthalmic artery origin. The right ICA siphon is patent with similar moderate calcified plaque. There is mild right cavernous but moderate proximal right supraclinoid ICA stenosis (series 11, image 88 and series 13 image 66). Normal right ophthalmic artery origin. The bilateral carotid termini are patent. MCA and ACA origins are patent with mild irregularity. Mild ACA A1 segment irregularity. Anterior communicating artery is normal. There is moderate distal left A2 segment irregularity and stenosis (series 16, image 21). Other ACA branches are normal to mildly irregular. The left MCA M1 segment is mildly irregular. The left MCA bifurcation is patent.  There is mild anterior M2 and moderate to severe anterior M3 segment irregularity with stenoses (series 16, image 28). Mild left MCA branch irregularity elsewhere. Right MCA M1 segment is mildly irregular and bifurcates early. There is up to moderate stenosis at the right MCA bifurcation best seen on series 16, image 15. Right MCA branches are mildly irregular. Venous sinuses: Patent. Anatomic variants: None. Delayed phase: No abnormal enhancement identified. The boundary of cytotoxic edema in the inferior left cerebellum is more apparent following contrast. Review of the MIP images confirms the above findings IMPRESSION: 1. Negative for large vessel occlusion. 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 2. There is little extracranial atherosclerosis, with no cervical carotid or vertebral artery stenosis. But there is moderate generalized intracranial atherosclerosis. Mostly mild intracranial arterial stenoses are noted - including in both vertebral artery V4 segments - with the following exceptions: - moderate right ICA siphon supraclinoid stenosis due to calcified plaque. - moderate right MCA bifurcation stenosis. - moderate left ACA A2 segment stenosis. - moderate and severe left MCA anterior division M3 stenoses. 3. Stable appearance of the acute left PICA infarct from the MRI earlier today. Cytotoxic edema with no hemorrhage or mass effect at this time. 4. Aortic Atherosclerosis (ICD10-I70.0). Electronically Signed   By: Genevie Ann M.D.   On: 01/21/2018 07:56   Mr Brain Wo Contrast  Result Date: 01/21/2018 CLINICAL DATA:  Persistent central vertigo.  Near syncope. EXAM: MRI HEAD WITHOUT CONTRAST TECHNIQUE: Multiplanar, multiecho pulse sequences of the brain and surrounding structures were obtained without intravenous contrast. COMPARISON:  None. FINDINGS: BRAIN: The  midline structures are normal. Abnormal diffusion restriction within the inferior left cerebellar hemisphere, in the  distribution of the posterior inferior cerebellar artery. No mass effect or acute hemorrhage. Fourth ventricle remains widely patent. Old left basal ganglia and corona radiata lacunar infarcts. Mild periventricular white matter hyperintensity. No age-advanced or lobar predominant atrophy. There is blooming on susceptibility weighted imaging in the area of the left posterior inferior cerebellar artery, which may indicate thrombus. VASCULAR: Major intracranial arterial and venous sinus flow voids are preserved. SKULL AND UPPER CERVICAL SPINE: The visualized skull base, calvarium, upper cervical spine and extracranial soft tissues are normal. SINUSES/ORBITS: Small maxillary retention cysts. No mastoid or middle ear effusion. Normal orbits. IMPRESSION: 1. Acute left posterior inferior cerebellar artery territory infarct without hemorrhage or mass effect 2. Chronic small vessel disease with old left basal ganglia and corona radiata lacunar infarcts. Electronically Signed   By: Ulyses Jarred M.D.   On: 01/21/2018 05:32   Dg Chest Portable 1 View  Result Date: 01/21/2018 CLINICAL DATA:  Dyspnea and hypoxia. EXAM: PORTABLE CHEST 1 VIEW COMPARISON:  12/07/2017 FINDINGS: Cardiomegaly with aortic atherosclerosis. Mild central vascular congestion superimposed on chronic interstitial lung disease. No effusion or pulmonary consolidation. No pneumothorax. Degenerative changes are seen about both shoulders and dorsal spine. IMPRESSION: Cardiomegaly with aortic atherosclerosis.  Mild CHF. Electronically Signed   By: Ashley Royalty M.D.   On: 01/21/2018 01:48     Scheduled Meds: . [START ON 01/23/2018] aspirin EC  81 mg Oral Daily  . atorvastatin  80 mg Oral Daily  . carvedilol  3.125 mg Oral BID  . cholecalciferol  5,000 Units Oral Daily  . [START ON 01/23/2018] clopidogrel  75 mg Oral Daily  . heparin  5,000 Units Subcutaneous Q8H  . tamsulosin  0.4 mg Oral Daily   Continuous Infusions:   Marzetta Board, MD, PhD [facility] Pager [phone]  If 7PM-7AM, please contact night-coverage [email or web address] Password [email or web address] 01/22/2018, 1:38 PM

## 2018-01-22 NOTE — Progress Notes (Signed)
Patient has slight change in metal status, talking about things that are not make sense. NIH score the same, family has concerns, MD notified.

## 2018-01-22 NOTE — Progress Notes (Addendum)
NEUROHOSPITALISTS STROKE TEAM - DAILY PROGRESS NOTE    HISTORY Patrick Olguin Lineberryis a 84 y.o.malepast history of hypertension, CHF, coronary artery disease, hyperlipidemia, stroke 10 years ago involving his speech with minimal residual deficits of word finding difficulty, was in usual state of health till 8 PM on 01/20/2018, when he was in his bed and had a sudden onset of vertigo. He describes that as room spinning. He was unable to maintain his balance. He tried to stand up but could not stand or walk. He had to crawl out of his bed to come to the door to call out for help from his wife. He did not have any chest pain at that time. He denied any headaches. He denied any nausea vomiting. Denies any loss of consciousness or lightheadedness. The symptoms have since improved but have not gone away. Continues to have room spinning sensation. Was very ataxic on walking for report. Has not been walked for many hours now. Denies any similar symptoms in the past denies any history suggestive of BPPV. Denies preceding illnesses, sicknesses, fevers, chills, upper respiratory infection, ear infection. Also of note, he is a patient of cardiology service as an outpatient. He has been followed with cardiac monitoring for frequent PVCs. Denies any current palpitations   SUBJECTIVE Woke patient from sleep, patient with mild dysarthria which improved as patient became more awake, but able to follow commands.  Denies headache dizziness, nausea, vomiting, ocular pain, diplopia. Admit to continued left finger numbness  OBJECTIVE Most recent Vital Signs: Vitals:   01/22/18 0046 01/22/18 0436 01/22/18 0943 01/22/18 1223  BP: 119/69 124/60  135/77  Pulse: (!) 45 (!) 41 (!) 45 (!) 45  Resp: 18 16  17   Temp:  97.8 F (36.6 C)  97.6 F (36.4 C)  TempSrc:  Oral  Axillary  SpO2: 91% 94%  92%  Weight:      Height:       CBG (last 3)  Recent Labs      01/20/18 2318  GLUCAP 170*    Physical Exam  HEENT-  Normocephalic, no lesions, without obvious abnormality.  Normal external eye and conjunctiva.   Cardiovascular- Bradycardic rhythm, S1-S2 audible, pulses palpable throughout   Lungs-no rhonchi or wheezing noted, no excessive working breathing.  Abdomen- All 4 quadrants palpated and nontender Musculoskeletal-no joint tenderness Skin-warm and dry  Neuro:  Mental Status: Alert, oriented, thought content appropriate.  Dysarthria right after awakening, but improved  Able to follow 3 step commands without difficulty. Cranial Nerves: II:  Visual fields grossly normal,  III,IV, VI: ptosis not present, extra-ocular motions intact bilaterally pupils equal, round, reactive to light  V,VII: smile symmetric, facial light touch sensation normal bilaterally VIII: hearing intact to voice IX,X: uvula rises symmetrically XI: bilateral shoulder shrug XII: midline tongue extension Motor: Right : Upper extremity   5/5    Left:     Upper extremity   5/5  Lower extremity   5/5     Lower extremity   5/5 Tone and bulk:normal tone throughout; no atrophy noted Sensory: admits to decreased sensation in left fingers Deep Tendon Reflexes: 2+ and symmetric throughout Plantars: Right: downgoing   Left: downgoing Cerebellar: Pt  with trunkal ataxia normal finger-to-nose Gait:Defered  IV Fluid Intake:    MEDICATIONS  . [START ON 01/23/2018] aspirin EC  81 mg Oral Daily  . atorvastatin  80 mg Oral Daily  . carvedilol  3.125 mg Oral BID  . cholecalciferol  5,000 Units Oral Daily  . [START ON 01/23/2018] clopidogrel  75 mg Oral Daily  . heparin  5,000 Units Subcutaneous Q8H  . tamsulosin  0.4 mg Oral Daily   PRN:  acetaminophen **OR** acetaminophen (TYLENOL) oral liquid 160 mg/5 mL **OR** acetaminophen, hydrALAZINE, meclizine, senna-docusate  Diet:   Diet Order           Diet Heart Room service appropriate? Yes; Fluid consistency: Thin  Diet effective  now          CLINICALLY SIGNIFICANT STUDIES Basic Metabolic Panel:  Recent Labs  Lab 01/20/18 2324 01/21/18 0030  NA 139  --   K 4.1  --   CL 108  --   CO2 21*  --   GLUCOSE 215*  --   BUN 22*  --   CREATININE 1.29*  --   CALCIUM 8.9  --   MG  --  1.9   Liver Function Tests: No results for input(s): AST, ALT, ALKPHOS, BILITOT, PROT, ALBUMIN in the last 168 hours. CBC:  Recent Labs  Lab 01/20/18 2324  WBC 11.6*  HGB 14.5  HCT 43.4  MCV 86.8  PLT 139*   Coagulation: No results for input(s): LABPROT, INR in the last 168 hours. Cardiac Enzymes: No results for input(s): CKTOTAL, CKMB, CKMBINDEX, TROPONINI in the last 168 hours. Urinalysis:  Recent Labs  Lab 01/20/18 2311  COLORURINE YELLOW  LABSPEC 1.019  PHURINE 5.0  GLUCOSEU NEGATIVE  HGBUR SMALL*  BILIRUBINUR NEGATIVE  KETONESUR NEGATIVE  PROTEINUR 30*  NITRITE NEGATIVE  LEUKOCYTESUR NEGATIVE   Lipid Panel    Component Value Date/Time   CHOL 151 01/22/2018 0720   TRIG 64 01/22/2018 0720   HDL 41 01/22/2018 0720   CHOLHDL 3.7 01/22/2018 0720   VLDL 13 01/22/2018 0720   LDLCALC 97 01/22/2018 0720   LDLCALC 114 (H) 11/09/2017 0841   HgbA1C  Lab Results  Component Value Date   HGBA1C 5.7 (H) 01/22/2018   CT Angio Head/Neck W Or Wo Contrast   01/21/2018 IMPRESSION:  1. Negative for large vessel occlusion. 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  2. There is little extracranial atherosclerosis, with no cervical carotid or vertebral artery stenosis. But there is moderate generalized intracranial atherosclerosis. Mostly mild intracranial arterial stenoses are noted - including in both vertebral artery V4 segments - with the following exceptions: - moderate right ICA siphon supraclinoid stenosis due to calcified plaque. - moderate right MCA bifurcation stenosis. - moderate left ACA A2 segment stenosis. - moderate and severe left MCA anterior division M3 stenoses.  3. Stable appearance of  the acute left PICA infarct from the MRI earlier today. Cytotoxic edema with no hemorrhage or mass effect at this time.  4. Aortic Atherosclerosis (ICD10-I70.0).   MR Brain Wo Contrast  01/21/2018 IMPRESSION: 1. Acute left posterior inferior cerebellar artery territory infarct without hemorrhage or mass effect  2. Chronic small vessel disease with old left basal ganglia and corona radiata lacunar infarcts.   Dg Chest Portable 1 View: 01/21/2018 IMPRESSION:  Cardiomegaly with aortic atherosclerosis.  Mild CHF.  2D Echocardiogram 01/21/18 Compared to a prior study in 10/2017, the LVEF is lower at 30-35%.   Cannot exclude apical thrombus -  recommend limited Definity   contrast study to evaluate.  EKG: SR with bigeminy.   Outstanding Stroke Work-up Studies:     TEE /Loop Recorder:                                             PENDING  Therapy Recommendations: CIR depending on progress VTE Prophylaxis: Heparin SQ  ASSESSMENT Patrick Maxwell is a 84 y.o. male  7 daysonset of dysarthria and dizziness due to left posterior inferior cerebellar artery infarct.etiology to be determined. Vascular risk factors of hypertension, hyperlipidemia, CHF and mild obesity  1. 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  Echocardiogram showed a reduced ejection fraction as compared to previous, but cannot exclude apical thrombus.  Patient with a history of PVCs, CHF and CAD with cardiomyopathy, at this time will recommend for a TEE and prolonged cardiac monitoring with possible loop recorder.  Continue secondary stroke prevention with dual antiplatelet therapy of aspirin and Plavix for 3 weeks, followed by Plavix alone.  Continue aggressive early rehabilitation with physical therapy, occupational therapy and speech therapy who currently recommend for acute inpatient rehab disposition.  Efficacious labs: Hemoglobin A1c 5.7, lipid panel within normal limits, cholesterol 151 triglycerides 64.  2.  History of  premature ventricular contractions 3.  Hypertension 4.  CKD stage II 5.  Coronary artery disease/ischemic cardiomyopathy/combined chronic systolic and diastolic CHF  Hospital day # 1  PLAN/RECOMMENDATIONS  Allow for permissive hypertension for the first 24-48h - only treat PRN if SBP >220 mmHg. Blood pressures can be gradually normalized to SBP<140 upon discharge.  Telemetry monitoring  TEE/ Loop recorder pending - likely to be done Monday  Frequent neuro checks  Continue dual antiplatelets with aspirin and Plavix x3 weeks and then continue along with Plavix.    Atorvastatin 40 mg PO daily  Risk factor modification  Early Rehab with PT consult, OT consult, Speech consult - recommend CIR  Avoid nephrotoxic drugs  Continue cardio protective medications   SIGNED Letha Cape DNP Neuro-hospitalist Team [phone] 01/22/2018, 1:54 PM   01/22/2018 ATTENDING ASSESSMENT    I have personally examined this patient, reviewed notes, independently viewed imaging studies, participated in medical decision making and plan of care.ROS completed by me personally and pertinent positives fully documented  I have made any additions or clarifications directly to the above note. Agree with note above. He has presented with embolic cerebellar infarct and will need further evaluation with TEE and loop recorder for cardiac source of embolism and paroxysmal atrial fibrillation. Recommend dual antiplatelet therapy. Therapy consults. Discussed with patient and son and answered questions. Greater than 50% time during this 35 minute visit was spent on counseling and coordination of Catap[place] embolic stroke and answered questions.  Antony Contras, MD Medical Director [facility] Pager: [phone] 01/22/2018 4:20 PM    To contact Stroke Continuity provider, please refer to [email or web address] After hours, contact General Neurology

## 2018-01-22 NOTE — Progress Notes (Signed)
SLP Cancellation Note  Patient Details Name: KEYVON HERTER MRN: [id number] DOB: 1931/01/19   Cancelled treatment:       Reason Eval/Treat Not Completed: Other (comment). Family politely decline SLP eval due to pt eating breakfast. They await PT eval primarily. Will f/u as able.    Brysyn Brandenberger, Katherene Ponto 01/22/2018, 9:35 AM

## 2018-01-22 NOTE — Evaluation (Signed)
Occupational Therapy Evaluation Patient Details Name: Patrick Maxwell MRN: [id number] DOB: 10/29/1930 Today's Date: 01/22/2018    History of Present Illness Patrick Maxwell is a 84 y.o. male with onset of dysarthria and dizziness due to left posterior inferior cerebellar artery infarct   Clinical Impression   Pt admitted with CVA. Pt currently with functional limitations due to the deficits listed below (see OT Problem List).  Pt will benefit from skilled OT to increase their safety and independence with ADL and functional mobility for ADL to facilitate discharge to venue listed below.      Follow Up Recommendations  CIR - depending on progress   Equipment Recommendations  None recommended by OT       Precautions / Restrictions Precautions Precautions: Fall      Mobility Bed Mobility Overal bed mobility: Needs Assistance Bed Mobility: Supine to Sit;Sit to Supine     Supine to sit: Min assist Sit to supine: Min assist      Transfers Overall transfer level: Needs assistance Equipment used: Rolling walker (2 wheeled) Transfers: Sit to/from Stand Sit to Stand: Mod assist              Balance Overall balance assessment: Needs assistance Sitting-balance support: No upper extremity supported Sitting balance-Leahy Scale: Fair     Standing balance support: Bilateral upper extremity supported Standing balance-Leahy Scale: Poor                             ADL either performed or assessed with clinical judgement   ADL Overall ADL's : Needs assistance/impaired Eating/Feeding: Sitting;Minimal assistance   Grooming: Sitting;Minimal assistance                       Toileting- Clothing Manipulation and Hygiene: Maximal assistance;Sit to/from stand;Cueing for sequencing;Cueing for safety Toileting - Clothing Manipulation Details (indicate cue type and reason): sit to stand noted.  ataxia noted with sit to stand transition as well as  standing             Vision Patient Visual Report: No change from baseline              Pertinent Vitals/Pain Pain Assessment: No/denies pain     Hand Dominance  right   Extremity/Trunk Assessment Upper Extremity Assessment Upper Extremity Assessment: LUE deficits/detail LUE Deficits / Details: decreased coordination noted LUE with finger nose finger activity as well as fxal task           Communication Communication Communication: No difficulties   Cognition Arousal/Alertness: Awake/alert Behavior During Therapy: WFL for tasks assessed/performed Overall Cognitive Status: Within Functional Limits for tasks assessed                                     General Comments    educated on importance of not getting OOB without A.             Home Living Family/patient expects to be discharged to:: Private residence Living Arrangements: Spouse/significant other Available Help at Discharge: Family Type of Home: House Home Access: Stairs to enter Technical brewer of Steps: 4 Entrance Stairs-Rails: Right Home Layout: One level         Bathroom Toilet: Handicapped height     [place]: None          Prior Functioning/Environment Level of Independence: Independent  OT Problem List: Decreased strength;Decreased activity tolerance;Decreased knowledge of use of DME or AE;Impaired UE functional use;Decreased safety awareness;Decreased coordination;Impaired balance (sitting and/or standing)      OT Treatment/Interventions: Self-care/ADL training;Patient/family education    OT Goals(Current goals can be found in the care plan section) Acute Rehab OT Goals Patient Stated Goal: get well OT Goal Formulation: With patient Time For Goal Achievement: 02/05/18  OT Frequency: Min 2X/week   Barriers to D/C:               AM-PAC PT "6 Clicks" Daily Activity     Outcome Measure Help from another person eating  meals?: A Little Help from another person taking care of personal grooming?: A Little Help from another person toileting, which includes using toliet, bedpan, or urinal?: A Lot Help from another person bathing (including washing, rinsing, drying)?: A Lot Help from another person to put on and taking off regular upper body clothing?: A Little Help from another person to put on and taking off regular lower body clothing?: A Lot 6 Click Score: 15   End of Session Nurse Communication: Mobility status  Activity Tolerance: Patient limited by lethargy Patient left: in bed;with call bell/phone within reach;with family/visitor present  OT Visit Diagnosis: Unsteadiness on feet (R26.81);Other abnormalities of gait and mobility (R26.89);Muscle weakness (generalized) (M62.81);Ataxia, unspecified (R27.0);Dizziness and giddiness (R42)                Time: 1030-1049 OT Time Calculation (min): 19 min Charges:  OT General Charges $OT Visit: 1 Visit OT Evaluation $OT Eval Moderate Complexity: 1 Mod G-Codes:     Kari Baars, OT [phone]  Payton Mccallum D 01/22/2018, 10:58 AM

## 2018-01-22 NOTE — Evaluation (Signed)
Physical Therapy Evaluation Patient Details Name: Patrick Maxwell MRN: [id number] DOB: [birth year] Today's Date: 01/22/2018   History of Present Illness  [name] is a 84 y.o. male with onset of dysarthria and dizziness due to left posterior inferior cerebellar artery infarctstroke, HTN, PVCs, CHF, and CAD.   Clinical Impression  Pt with significant left/anterior lean in standing and with gait requiring two person mod hand held assist to walk to the bathroom and back to the recliner chair. He responds well to cues for midline, but cannot maintain dynamically.  He would be an excellent CIR candidate.   PT to follow acutely for deficits listed below.       Follow Up Recommendations CIR    Equipment Recommendations  3in1 (PT)    Recommendations for Other Services Rehab consult     Precautions / Restrictions Precautions Precautions: Fall Precaution Comments: left sided ataxia and left lateral lean      Mobility  Bed Mobility Overal bed mobility: Needs Assistance Bed Mobility: Supine to Sit     Supine to sit: Supervision     General bed mobility comments: Supervision for safety, pt reporting dizziness and nausea with transitions.   Transfers Overall transfer level: Needs assistance Equipment used: 2 person hand held assist Transfers: Sit to/from Stand Sit to Stand: Mod assist;+2 physical assistance         General transfer comment: Mod assist to prevent anterior and left LOB when coming to stand, the thrusted forward and needed cues to find midline.   Ambulation/Gait Ambulation/Gait assistance: Mod assist;+2 physical assistance Ambulation Distance (Feet): 20 Feet Assistive device: 2 person hand held assist Gait Pattern/deviations: Step-through pattern;Ataxic;Staggering left;Scissoring   Gait velocity interpretation: <1.8 ft/sec, indicate of risk for recurrent falls General Gait Details: Pt with very narrow BOS, near scissoring, left anterior lateral lean.   Cues to wide base during gait.  Some awareness of where he is leaning, just difficult to correct right now, dizziness with gait, nausea bag brought with [place].        Modified Rankin (Stroke Patients Only) Modified Rankin (Stroke Patients Only) Pre-Morbid Rankin Score: No symptoms Modified Rankin: Moderately severe disability     Balance Overall balance assessment: Needs assistance Sitting-balance support: Bilateral upper extremity supported;No upper extremity supported Sitting balance-Leahy Scale: Fair Sitting balance - Comments: static sitting supervision, pt attempted to wipe himself in bathroom from toilet and almost fell forward and to the left off of the commode.    Standing balance support: Bilateral upper extremity supported Standing balance-Leahy Scale: Poor Standing balance comment: needs two person mod assist.                              Pertinent Vitals/Pain Pain Assessment: No/denies pain    Home Living Family/patient expects to be discharged to:: Private residence Living Arrangements: Spouse/significant other Available Help at Discharge: Family Type of Home: House Home Access: Stairs to enter Entrance Stairs-Rails: Right Entrance Stairs-Number of Steps: 4 Home Layout: One level Home Equipment: None      Prior Function Level of Independence: Independent               Hand Dominance   Dominant Hand: Right    Extremity/Trunk Assessment   Upper Extremity Assessment Upper Extremity Assessment: Defer to OT evaluation    Lower Extremity Assessment Lower Extremity Assessment: LLE deficits/detail LLE Deficits / Details: left leg with 4+/5 strength throughout, but incoordinated with gross  functional movements like gait.   LLE Coordination: decreased gross motor    Cervical / Trunk Assessment Cervical / Trunk Assessment: Other exceptions Cervical / Trunk Exceptions: decreased perception of midline  Communication   Communication: No  difficulties  Cognition Arousal/Alertness: Awake/alert Behavior During Therapy: WFL for tasks assessed/performed Overall Cognitive Status: Within Functional Limits for tasks assessed                                               Assessment/Plan    PT Assessment Patient needs continued PT services  PT Problem List Decreased strength;Decreased activity tolerance;Decreased balance;Decreased mobility;Decreased coordination;Decreased knowledge of use of DME;Decreased knowledge of precautions       PT Treatment Interventions DME instruction;Gait training;Stair training;Functional mobility training;Therapeutic activities;Therapeutic exercise;Balance training;Neuromuscular re-education;Patient/family education;Cognitive remediation    PT Goals (Current goals can be found in the Care Plan section)  Acute Rehab PT Goals Patient Stated Goal: to get back to normal PT Goal Formulation: With patient/family Time For Goal Achievement: 02/05/18 Potential to Achieve Goals: Good    Frequency BID           AM-PAC PT "6 Clicks" Daily Activity  Outcome Measure Difficulty turning over in bed (including adjusting bedclothes, sheets and blankets)?: None Difficulty moving from lying on back to sitting on the side of the bed? : None Difficulty sitting down on and standing up from a chair with arms (e.g., wheelchair, bedside commode, etc,.)?: A Lot Help needed moving to and from a bed to chair (including a wheelchair)?: A Lot Help needed walking in hospital room?: A Lot Help needed climbing 3-5 steps with a railing? : A Lot 6 Click Score: 16    End of Session Equipment Utilized During Treatment: Gait belt Activity Tolerance: Patient limited by fatigue Patient left: in chair;with call bell/phone within reach;with family/visitor present   PT Visit Diagnosis: Ataxic gait (R26.0);Difficulty in walking, not elsewhere classified (R26.2);Other symptoms and signs involving the nervous  system (A45.364)    Time: 1349-1410 PT Time Calculation (min) (ACUTE ONLY): 21 min   Charges:         Wells Guiles B. Nahun Kronberg, PT, DPT [phone]   PT Evaluation $PT Eval Moderate Complexity: 1 Mod     01/22/2018, 5:01 PM

## 2018-01-23 DIAGNOSIS — Z8673 Personal history of transient ischemic attack (TIA), and cerebral infarction without residual deficits: Secondary | ICD-10-CM

## 2018-01-23 DIAGNOSIS — I5023 Acute on chronic systolic (congestive) heart failure: Secondary | ICD-10-CM

## 2018-01-23 DIAGNOSIS — E7800 Pure hypercholesterolemia, unspecified: Secondary | ICD-10-CM

## 2018-01-23 DIAGNOSIS — I493 Ventricular premature depolarization: Secondary | ICD-10-CM

## 2018-01-23 DIAGNOSIS — R7303 Prediabetes: Secondary | ICD-10-CM

## 2018-01-23 DIAGNOSIS — D72829 Elevated white blood cell count, unspecified: Secondary | ICD-10-CM

## 2018-01-23 DIAGNOSIS — I25110 Atherosclerotic heart disease of native coronary artery with unstable angina pectoris: Secondary | ICD-10-CM

## 2018-01-23 DIAGNOSIS — R001 Bradycardia, unspecified: Secondary | ICD-10-CM

## 2018-01-23 DIAGNOSIS — I639 Cerebral infarction, unspecified: Secondary | ICD-10-CM

## 2018-01-23 NOTE — Progress Notes (Signed)
PROGRESS NOTE  Patrick Maxwell PXT:[id number] DOB: Nov 10, 1930 DOA: 01/20/2018 PCP: [name], MD   LOS: 2 days   Brief Narrative / Interim history: 84 y.o. male with a history of CAD with last cardiac catheterization May 2017, ischemic cardiomyopathy with chronic systolic heart failure with with last echo in November 13, 2017 showing ejection fraction 35 to 40%, history of bradycardia, history of PVCs PACs low at heart care, Dr. Serita Sheller, hypertension, hyperlipidemia last LDL in February 2019 at 114, history of CVA 10 years ago, prior to the emergency department in for evaluation of the onset of vertigo, and left-sided tingling.  He was found to have a CVA and was admitted to the hospital  Assessment & Plan: Principal Problem:   Acute CVA (cerebrovascular accident) [facility]) Active Problems:   History of CVA (cerebrovascular accident) without residual deficits   Hypertension, benign   Hypercholesteremia   Subclinical hypothyroidism   Palpitations   Acute combined systolic and diastolic congestive heart failure (HCC)   PVC's (premature ventricular contractions)   Coronary artery disease involving native coronary artery of native heart with unstable angina pectoris ([place])   BPH with elevated PSA   Bradycardia   Cerebellar infarct ([place])   Leukocytosis   Prediabetes   Acute CVA -Neurology consulted, appreciate input.  MRI on admission showed acute left posterior inferior cerebral artery territory infarct -CT angiogram negative for large vessel occlusion, the left PICA is partially thrombosed beginning at its origin -2D echo completed on 5/2 showed an EF of 30 to 35%, diffuse hypokinesis -Neurology recommends a TEE as well as a loop recorder -LDL 97, high-dose statin -A1c 5.7 -Placed on aspirin and Plavix for 3 weeks, followed by Plavix alone -PT recommending CIR, awaiting final decision -d/w Dr. Erlinda Hong with neurology, patient might benefit from anticoagulation, will discuss with  cardiology as well   History of premature ventricular contractions  -followed by cardiology, as above will need loop recorder, will also evaluate for underlying paroxysmal A. Fib -?anticoagulation, will d/w cardiology  -Reviewed telemetry, high burden of PVCs, patient symptomatic at times  Chronic combined systolic and diastolic CHF -Appears euvolemic, troponin negative -At home he is on Coreg, Lasix, Cozaar, hold Lasix and Cozaar   Coronary artery disease/ischemic cardiomyopathy -No chest pain, continue aspirin, Plavix, high-dose statin.  Chronic kidney disease stage II -Creatinine appears at baseline, recheck in am   Hypertension -Allow permissive hypertension  Hyperlipidemia -High-dose statin as above  BPH -Continue Flomax  DVT prophylaxis: Heparin Code Status: Full code Family Communication: Wife present at bedside Disposition Plan: CIR evaluation pending  Consultants:   Neurology  Procedures:   2D echo:  Study Conclusions - Left ventricle: The cavity size was moderately dilated. Wall thickness was normal. Systolic function was moderately to severely reduced. The estimated ejection fraction was in the range of 30% to 35%. Diffuse hypokinesis. Cannot exclude apical thrombus on non-contrast image. The study is not technically sufficient to allow evaluation of LV diastolic function. - Aortic valve: Trileaflet. Sclerosis without stenosis. There was trivial regurgitation. - Mitral valve: Mildly thickened leaflets . There was moderate regurgitation. - Left atrium: The atrium was mildly dilated. - Right ventricle: The cavity size was mildly dilated. - Tricuspid valve: There was mild regurgitation. - Pulmonary arteries: PA peak pressure: 59 mm Hg (S). - Inferior vena cava: The vessel was dilated. The respirophasic diameter changes were blunted (< 50%), consistent with elevated central venous pressure.   Antimicrobials:  None    Subjective: -No complaints this  morning, still feeling weak on standing, complains of palpitations overnight and feels like his heart is pounding on his chest at times  Objective: Vitals:   01/22/18 1731 01/22/18 2044 01/23/18 0603 01/23/18 1217  BP: 122/66 129/75 119/73 128/82  Pulse: (!) 52 (!) 44 (!) 44 (!) 54  Resp: 15  18 15   Temp: 97.7 F (36.5 C) 98.1 F (36.7 C) 98.1 F (36.7 C) 97.6 F (36.4 C)  TempSrc: Oral Oral Oral Oral  SpO2: 96% 94% 94% 99%  Weight:      Height:        Intake/Output Summary (Last 24 hours) at 01/23/2018 1426 Last data filed at 01/23/2018 0831 Gross per 24 hour  Intake 210 ml  Output -  Net 210 ml   Filed Weights   01/21/18 1737  Weight: 86.4 kg (190 lb 7.6 oz)    Examination:  Constitutional: No distress Eyes: No scleral icterus, lids and conjunctive are normal ENMT: Moist mucous membranes Respiratory: Clear to auscultation bilaterally without wheezing or crackles Cardiovascular: Regular rate and rhythm, no murmurs Abdomen: Soft, nontender, nondistended, bowel sounds positive Skin: No rashes seen Neurologic: Nonfocal   Data Reviewed: I have independently reviewed following labs and imaging studies   CBC: Recent Labs  Lab 01/20/18 2324  WBC 11.6*  HGB 14.5  HCT 43.4  MCV 86.8  PLT 614*   Basic Metabolic Panel: Recent Labs  Lab 01/20/18 2324 01/21/18 0030  NA 139  --   K 4.1  --   CL 108  --   CO2 21*  --   GLUCOSE 215*  --   BUN 22*  --   CREATININE 1.29*  --   CALCIUM 8.9  --   MG  --  1.9   GFR: Estimated Creatinine Clearance: 45.4 mL/min (A) (by C-G formula based on SCr of 1.29 mg/dL (H)). Liver Function Tests: No results for input(s): AST, ALT, ALKPHOS, BILITOT, PROT, ALBUMIN in the last 168 hours. No results for input(s): LIPASE, AMYLASE in the last 168 hours. No results for input(s): AMMONIA in the last 168 hours. Coagulation Profile: No results for input(s): INR, PROTIME in the last 168 hours. Cardiac Enzymes: No results for input(s):  CKTOTAL, CKMB, CKMBINDEX, TROPONINI in the last 168 hours. BNP (last 3 results) No results for input(s): PROBNP in the last 8760 hours. HbA1C: Recent Labs    01/22/18 0720  HGBA1C 5.7*   CBG: Recent Labs  Lab 01/20/18 2318  GLUCAP 170*   Lipid Profile: Recent Labs    01/22/18 0720  CHOL 151  HDL 41  LDLCALC 97  TRIG 64  CHOLHDL 3.7   Thyroid Function Tests: No results for input(s): TSH, T4TOTAL, FREET4, T3FREE, THYROIDAB in the last 72 hours. Anemia Panel: No results for input(s): VITAMINB12, FOLATE, FERRITIN, TIBC, IRON, RETICCTPCT in the last 72 hours. Urine analysis:    Component Value Date/Time   COLORURINE YELLOW 01/20/2018 2311   APPEARANCEUR HAZY (A) 01/20/2018 2311   LABSPEC 1.019 01/20/2018 2311   PHURINE 5.0 01/20/2018 2311   GLUCOSEU NEGATIVE 01/20/2018 2311   HGBUR SMALL (A) 01/20/2018 2311   BILIRUBINUR NEGATIVE 01/20/2018 2311   KETONESUR NEGATIVE 01/20/2018 2311   PROTEINUR 30 (A) 01/20/2018 2311   UROBILINOGEN 1.0 10/06/2009 1406   NITRITE NEGATIVE 01/20/2018 2311   LEUKOCYTESUR NEGATIVE 01/20/2018 2311   Sepsis Labs: Invalid input(s): PROCALCITONIN, LACTICIDVEN  No results found for this or any previous visit (from the past 240 hour(s)).    Radiology Studies: No results  found.   Scheduled Meds: . aspirin EC  81 mg Oral Daily  . atorvastatin  40 mg Oral q1800  . carvedilol  3.125 mg Oral BID  . cholecalciferol  5,000 Units Oral Daily  . clopidogrel  75 mg Oral Daily  . heparin  5,000 Units Subcutaneous Q8H  . tamsulosin  0.4 mg Oral Daily   Continuous Infusions:   Marzetta Board, MD, PhD [facility] Pager [phone]  If 7PM-7AM, please contact night-coverage [email or web address] Password [email or web address] 01/23/2018, 2:26 PM

## 2018-01-23 NOTE — Progress Notes (Signed)
SLP Cancellation Note  Patient Details Name: PRIMUS GRITTON MRN: [id number] DOB: [birth year]   Cancelled treatment:       Reason Eval/Treat Not Completed: SLP screened, no needs identified, will sign off   Sonia Baller, MA, CCC-SLP 01/23/18 3:26 PM

## 2018-01-23 NOTE — Progress Notes (Addendum)
NEUROHOSPITALISTS STROKE TEAM - DAILY PROGRESS NOTE    HISTORY Patrick Easterly Lineberryis a 84 y.o.male who presented with a sudden onset of vertigo while in bed on evening of 5/1. He describes that as room spinning. He was unable to maintain his balance. He tried to stand up but could not stand or walk. He had to crawl out of his bed to come to the door to call out for help from his wife. He denied any nausea vomiting. Denies any loss of consciousness or lightheadedness. The symptoms have since improved but have not gone away.  Chief Complaint: "I get dizzy as soon as I sit up" Interval History: Speech sounds clear, denies headache dizziness while lying in bed, nausea, vomiting, ocular pain, diplopia. When he sits up or stands, he lists to the left and has return of dizziness.  OBJECTIVE Most recent Vital Signs: Vitals:   01/22/18 1731 01/22/18 2044 01/23/18 0603 01/23/18 1217  BP: 122/66 129/75 119/73 128/82  Pulse: (!) 52 (!) 44 (!) 44 (!) 54  Resp: 15  18 15   Temp: 97.7 F (36.5 C) 98.1 F (36.7 C) 98.1 F (36.7 C) 97.6 F (36.4 C)  TempSrc: Oral Oral Oral Oral  SpO2: 96% 94% 94% 99%  Weight:      Height:       CBG (last 3)  Recent Labs    01/20/18 2318  GLUCAP 170*    Physical Exam  HEENT-  Normocephalic, no lesions, without obvious abnormality.  Normal external eye and conjunctiva.   Cardiovascular- Bradycardic rhythm, S1-S2 audible, pulses palpable throughout   Lungs-no rhonchi or wheezing noted, no excessive working breathing.  Abdomen- All 4 quadrants palpated and nontender Musculoskeletal-no joint tenderness Skin-warm and dry  Neuro:  Mental Status: Alert, oriented, thought content appropriate.  Dysarthria right after awakening, but improved  Able to follow 3 step commands without difficulty. Cranial Nerves: II:  Visual fields grossly normal,  III,IV, VI: ptosis not present, extra-ocular motions intact  bilaterally pupils equal, round, reactive to light  V,VII: smile symmetric, facial light touch sensation normal bilaterally VIII: hearing intact to voice IX,X: uvula rises symmetrically XI: bilateral shoulder shrug XII: midline tongue extension Motor: Right : Upper extremity   5/5    Left:     Upper extremity   5/5  Lower extremity   5/5     Lower extremity   5/5 Tone and bulk:normal tone throughout; no atrophy noted Sensory: admits to decreased sensation in left fingers Deep Tendon Reflexes: 2+ and symmetric throughout Plantars: Right: downgoing   Left: downgoing Cerebellar: Pt with trunkal ataxia normal finger-to-nose, slightly off with left heal to shin Gait:Defered  IV Fluid Intake:    MEDICATIONS  . aspirin EC  81 mg Oral Daily  . atorvastatin  40 mg Oral q1800  . carvedilol  3.125 mg Oral BID  . cholecalciferol  5,000 Units Oral Daily  . clopidogrel  75 mg Oral Daily  . heparin  5,000 Units Subcutaneous Q8H  . tamsulosin  0.4 mg Oral Daily   PRN:  acetaminophen **OR** acetaminophen (TYLENOL) oral liquid 160 mg/5 mL **OR** acetaminophen, hydrALAZINE, meclizine, senna-docusate  Diet:   Diet Order  Diet Heart Room service appropriate? Yes; Fluid consistency: Thin  Diet effective now          CLINICALLY SIGNIFICANT STUDIES Basic Metabolic Panel:  Recent Labs  Lab 01/20/18 2324 01/21/18 0030  NA 139  --   K 4.1  --   CL 108  --   CO2 21*  --   GLUCOSE 215*  --   BUN 22*  --   CREATININE 1.29*  --   CALCIUM 8.9  --   MG  --  1.9   CBC:  Recent Labs  Lab 01/20/18 2324  WBC 11.6*  HGB 14.5  HCT 43.4  MCV 86.8  PLT 139*   CoaUrinalysis:  Recent Labs  Lab 01/20/18 2311  COLORURINE YELLOW  LABSPEC 1.019  PHURINE 5.0  GLUCOSEU NEGATIVE  HGBUR SMALL*  BILIRUBINUR NEGATIVE  KETONESUR NEGATIVE  PROTEINUR 30*  NITRITE NEGATIVE  LEUKOCYTESUR NEGATIVE   Lipid Panel    Component Value Date/Time   CHOL 151 01/22/2018 0720   TRIG 64  01/22/2018 0720   HDL 41 01/22/2018 0720   CHOLHDL 3.7 01/22/2018 0720   VLDL 13 01/22/2018 0720   LDLCALC 97 01/22/2018 0720   LDLCALC 114 (H) 11/09/2017 0841   HgbA1C  Lab Results  Component Value Date   HGBA1C 5.7 (H) 01/22/2018   CT Angio Head/Neck W Or Wo Contrast   01/21/2018 IMPRESSION:  1. Negative for large vessel occlusion. 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  2. There is little extracranial atherosclerosis, with no cervical carotid or vertebral artery stenosis. But there is moderate generalized intracranial atherosclerosis. Mostly mild intracranial arterial stenoses are noted - including in both vertebral artery V4 segments - with the following exceptions: - moderate right ICA siphon supraclinoid stenosis due to calcified plaque. - moderate right MCA bifurcation stenosis. - moderate left ACA A2 segment stenosis. - moderate and severe left MCA anterior division M3 stenoses.  3. Stable appearance of the acute left PICA infarct from the MRI earlier today. Cytotoxic edema with no hemorrhage or mass effect at this time.  4. Aortic Atherosclerosis (ICD10-I70.0).   MR Brain Wo Contrast  01/21/2018 IMPRESSION: 1. Acute left posterior inferior cerebellar artery territory infarct without hemorrhage or mass effect  2. Chronic small vessel disease with old left basal ganglia and corona radiata lacunar infarcts.   Dg Chest Portable 1 View: 01/21/2018 IMPRESSION:  Cardiomegaly with aortic atherosclerosis.  Mild CHF.  2D Echocardiogram 01/21/18 Compared to a prior study in 10/2017, the LVEF is lower at 30-35%.   Cannot exclude apical thrombus - recommend limited Definity   contrast study to evaluate.  EKG: SR with bigeminy.   Outstanding Stroke Work-up Studies:     TEE /Loop Recorder:                                             PENDING  Therapy Recommendations: CIR depending on progress VTE Prophylaxis: Heparin SQ  ASSESSMENT Mr. JOEANGEL JEANPAUL is a 84 y.o.  man w/dysarthria and dizziness due to left posterior inferior cerebellar artery infarct. Vascular risk factors of hypertension, CAD, hyperlipidemia, CHF and mild obesity  1. 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   -Echocardiogram showed a reduced ejection fraction as compared to previous, but cannot exclude apical thrombus. will recommend for a TEE at this time  -  Tele: [place], PVCs.   - Continue secondary stroke prevention with dual antiplatelet therapy of aspirin and Plavix for 3 weeks, followed by Plavix alone for now. However, anticoagulation may be indicated given the decreased EF and ischemic cardiomyopathy. Will need to confer with Cardiology.  2.  HLD- LDL 97, goal <70. Continue statin 3.  Hypertension- normalize BP at this time 4.  CKD stage II- trend labe and avoid nephrotoxic drugs 5.  Coronary artery disease/ischemic cardiomyopathy/combined chronic systolic and diastolic CHF  Hospital day # 2  PLAN/RECOMMENDATIONS  Normalize BP now  Telemetry monitoring  Cardiology consult  TEE/ Loop recorder may not be needed pending further wk up on cardiomyopathy etc by cards  Frequent neuro checks  Continue dual antiplatelets with aspirin and Plavix x3 weeks and then continue along with Plavix, unless [place] is indicated  Atorvastatin 40 mg PO daily  Risk factor modification  Rehab efforts as tol   Avoid nephrotoxic drugs  Continue cardio protective medications  Updated RN and wife at bedside on above plan  Dispo: CIR when wk up completed  ATTENDING NOTE: I reviewed above note and agree with the assessment and plan. I have made any additions or clarifications directly to the above note. Pt was seen and examined.   84 year old male with history of CAD, CHF, HTN, HLD, PVCs, and stroke admitted for vertigo, gait difficulty, and ataxia.   He had a stroke about 10 years ago with word finding difficulties.  He was in [place] at the  time and admitted to the hospital and was told to have a small left temporal stroke.  His speech resolved in 6 months.  This time he was admitted for vertigo, not able to walk, with ataxia.  MRI showed left PICA infarct, and old left BG/CR infarct.  CTA head and neck left PICA occlusion with diffuse intracranial stenosis including right cavernous SCA and right M1.  EF 30 to 35%, down from 35 to 40% in 10/2017.  He also had a 48-hour Holter monitoring PTA showed frequent PVCs which he was put on Coreg.  A1c 5.7 and LDL 87 this time.  His stroke could be due to PICA occlusion due to atherosclerosis or cardioembolic given low EF.  Recommend cardiology consultation to consider TEE looking for LV thrombus versus initiate anticoagulation due to low EF in the setting of stroke.  Continue aspirin and Plavix as well as Lipitor for stroke prevention for now given intracranial stenosis. Will follow  Rosalin Hawking, MD PhD Stroke Neurology 01/23/2018 4:40 PM     To contact Stroke Continuity provider, please refer to [email or web address] After hours, contact General Neurology

## 2018-01-23 NOTE — Consult Note (Signed)
Physical Medicine and Rehabilitation Consult Reason for Consult: Stroke Referring Physician: Wyvonnia Dusky   HPI: Patrick Maxwell is a 84 y.o. male with past medical history of CVA 2009, hypertension, hyperlipidemia, chronic systolic CHF, CAD, bradycardia, PVCs presented on 01/21/18 with left PICA stroke. History taken from chart review grandson, and patient. Patient started to experience vertigo and tingling on his left  He was noted to be ataxic. When he presented to the ED(vertigo and tingling and resolved. Neurology was consult and workup initiated. MRI brain ordered, reviewed showing left cerebellar infarct. Per report, acute left posterior inferior cerebellar artery infarct with chronic small vessel disease and old left ganglia and corona radiata infarcts. CT angiogram negative for large vessel occlusion, left PICA partial thrombosis. Echo completed showing ejection fraction of 30-35% with diffuse hypokinesis. Neurology recommending a TEE and loop recorder. Recommending aspirin and Plavix fr 3 weeks, then Plavix alone. Hospital course further complicated by hyperglycemia and leukocytosis. Patient noted to be ambulating in room with assistance of grandson with episodes of loss of balance. When instructed patient to not ambulate without staff, stating that he has been doing this and is fine as long as he has somebody next to him. Discussed with nursing regarding patient education. Patient lives with his wife who can provide supervision with family nearby.  Review of Systems  Neurological: Positive for tingling and weakness.  All other systems reviewed and are negative.  Past Medical History:  Diagnosis Date  . CAD (coronary artery disease)   . CHF (congestive heart failure) ([place])   . Frequent unifocal PVCs    Palpitations since early 20s, PVCs and pairs  . Hyperlipidemia   . Hypertension   . Stroke [facility]) 2009   Past Surgical History:  Procedure Laterality Date  . CARDIAC  CATHETERIZATION N/A 01/23/2016   Procedure: Right/Left Heart Cath and Coronary Angiography;  Surgeon: Burnell Blanks, MD;  Location: [place] [facility];  Service: Cardiovascular;  Laterality: N/A;  . CARDIAC CATHETERIZATION N/A 01/23/2016   Procedure: Coronary Stent Intervention;  Surgeon: Burnell Blanks, MD;  Location: [place] [facility];  Service: Cardiovascular;  Laterality: N/A;  . HERNIA REPAIR  06/1992   Family History  Problem Relation Age of Onset  . Stroke Mother   . Early death Mother   . Cancer Brother    Social History:  reports that he has never smoked. He has never used smokeless tobacco. He reports that he does not drink alcohol or use drugs. Allergies: No Known Allergies Medications Prior to Admission  Medication Sig Dispense Refill  . Ascorbic Acid (VITAMIN C PO) Take 250 mg by mouth 3 (three) times a week.     Marland Kitchen aspirin 81 MG tablet Take 81 mg by mouth daily.     . carvedilol (COREG) 3.125 MG tablet Take 1 tablet (3.125 mg total) by mouth 2 (two) times daily. 180 tablet 3  . Cholecalciferol (VITAMIN D PO) Take 5,000 Units by mouth daily.    . Coenzyme Q10 (COQ10) 100 MG CAPS Take 1 capsule by mouth daily.    . furosemide (LASIX) 20 MG tablet TAKE ONE TABLET BY MOUTH ONCE DAILY 90 tablet 1  . losartan (COZAAR) 25 MG tablet Take 0.5 tablets (12.5 mg total) by mouth daily. 90 tablet 3  . Magnesium 100 MG TABS Take 1 tablet by mouth 2 (two) times daily.    . Multiple Vitamin (MULTIVITAMIN WITH MINERALS) TABS tablet Take 1 tablet by mouth daily.    Marland Kitchen  nitroGLYCERIN (NITROSTAT) 0.4 MG SL tablet Place 1 tablet (0.4 mg total) under the tongue every 5 (five) minutes as needed. 25 tablet 3  . Potassium 99 MG TABS Take 99 mg by mouth daily.     . tamsulosin (FLOMAX) 0.4 MG CAPS capsule Take 1 capsule (0.4 mg total) by mouth daily. 30 capsule 3  . vitamin E 400 UNIT capsule Take 400 Units by mouth 3 (three) times a week.    Marland Kitchen atorvastatin (LIPITOR) 20 MG tablet Take 1  tablet (20 mg total) by mouth daily. (Patient not taking: Reported on 01/21/2018) 90 tablet 3  . benzonatate (TESSALON) 100 MG capsule Take 1 capsule (100 mg total) by mouth 3 (three) times daily as needed for cough. (Patient not taking: Reported on 01/21/2018) 20 capsule 0    Home: Home Living Family/patient expects to be discharged to:: Private residence Living Arrangements: Spouse/significant other Available Help at Discharge: Family Type of Home: House Home Access: Stairs to enter Technical brewer of Steps: 4 Entrance Stairs-Rails: Right Home Layout: One level Biochemist, clinical: Handicapped height [place]: None  Functional History: Prior Function Level of Independence: Independent Functional Status:  Mobility: Bed Mobility Overal bed mobility: Needs Assistance Bed Mobility: Supine to Sit Supine to sit: Supervision Sit to supine: Min assist General bed mobility comments: Supervision for safety, pt reporting dizziness and nausea with transitions.  Transfers Overall transfer level: Needs assistance Equipment used: 2 person hand held assist Transfers: Sit to/from Stand Sit to Stand: Mod assist, +2 physical assistance General transfer comment: Mod assist to prevent anterior and left LOB when coming to stand, the thrusted forward and needed cues to find midline.  Ambulation/Gait Ambulation/Gait assistance: Mod assist, +2 physical assistance Ambulation Distance (Feet): 20 Feet Assistive device: 2 person hand held assist Gait Pattern/deviations: Step-through pattern, Ataxic, Staggering left, Scissoring General Gait Details: Pt with very narrow BOS, near scissoring, left anterior lateral lean.  Cues to wide base during gait.  Some awareness of where he is leaning, just difficult to correct right now, dizziness with gait, nausea bag brought with [place].   Gait velocity interpretation: <1.8 ft/sec, indicate of risk for recurrent falls    ADL: ADL Overall ADL's : Needs  assistance/impaired Eating/Feeding: Sitting, Minimal assistance Grooming: Sitting, Minimal assistance Toileting- Clothing Manipulation and Hygiene: Maximal assistance, Sit to/from stand, Cueing for sequencing, Cueing for safety Toileting - Clothing Manipulation Details (indicate cue type and reason): sit to stand noted.  ataxia noted with sit to stand transition as well as standing  Cognition: Cognition Overall Cognitive Status: Within Functional Limits for tasks assessed Orientation Level: Oriented X4 Cognition Arousal/Alertness: Awake/alert Behavior During Therapy: WFL for tasks assessed/performed Overall Cognitive Status: Within Functional Limits for tasks assessed  Blood pressure 119/73, pulse (!) 44, temperature 98.1 F (36.7 C), temperature source Oral, resp. rate 18, height 5\' 11"  (1.803 m), weight 86.4 kg (190 lb 7.6 oz), SpO2 94 %. Physical Exam  Vitals reviewed. Constitutional: He is oriented to person, place, and time. He appears well-developed and well-nourished.  HENT:  Head: Normocephalic and atraumatic.  Eyes: EOM are normal. Right eye exhibits no discharge. Left eye exhibits no discharge.  Neck: Normal range of motion. Neck supple.  Cardiovascular: Regular rhythm.  +bradycardia  Respiratory: Effort normal and breath sounds normal.  GI: Soft. Bowel sounds are normal.  Musculoskeletal:  No edema or tenderness in extremities  Neurological: He is alert and oriented to person, place, and time.  Motor: Right upper extremity/right lower extremity: 5/5 proximal to  distal Left upper extremity/left lower extremity: 4+/5 proximal distal No ataxia in upper extremities, ataxic gait noted Sensation intact light touch  Skin: Skin is warm and dry.  Psychiatric: His affect is blunt. He expresses impulsivity.    No results found for this or any previous visit (from the past 24 hour(s)). No results found.  Assessment/Plan: Diagnosis: Acute left posterior inferior cerebellar  artery infarct  Labs and images independently reviewed.  Records reviewed and summated above. Stroke: Continue secondary stroke prophylaxis and Risk Factor Modification listed below:   Antiplatelet therapy:   Blood Pressure Management:  Continue current medication with prn's with permisive HTN per primary team Statin Agent:   Pre-diabetes management:   Left sided hemiparesis: fit for orthosis to prevent contractures (resting hand splint for day, wrist cock up splint at night, PRAFO, etc)  1. Does the need for close, 24 hr/day medical supervision in concert with the patient's rehab needs make it unreasonable for this patient to be served in a less intensive setting? Yes  2. Co-Morbidities requiring supervision/potential complications: leukocytosis (cont to monitor for signs and symptoms of infection, further workup if indicated), CVA 2009, HTN (monitor and provide prns in accordance with increased physical exertion and pain), hyperlipidemia, chronic systolic CHF (Monitor in accordance with increased physical activity and avoid UE resistance excercises), CAD (cont meds), bradycardia (monitor heart rate with increased physical activity), PVCs (monitor), Prediabetes (Monitor in accordance with exercise and adjust meds as necessary) 3. Due to safety, disease management and patient education, does the patient require 24 hr/day rehab nursing? Yes 4. Does the patient require coordinated care of a physician, rehab nurse, PT (1-2 hrs/day, 5 days/week) and OT (1-2 hrs/day, 5 days/week) to address physical and functional deficits in the context of the above medical diagnosis(es)? Yes Addressing deficits in the following areas: balance, endurance, locomotion, strength, transferring, bathing, dressing, toileting and psychosocial support 5. Can the patient actively participate in an intensive therapy program of at least 3 hrs of therapy per day at least 5 days per week? Yes 6. The potential for patient to make  measurable gains while on inpatient rehab is excellent 7. Anticipated functional outcomes upon discharge from inpatient rehab are supervision and min assist  with PT, supervision and min assist with OT, n/a with SLP. 8. Estimated rehab length of stay to reach the above functional goals is: 12-17 days. 9. Anticipated D/C setting: Home 10. Anticipated post D/C treatments: HH therapy and Home excercise program 11. Overall Rehab/Functional Prognosis: good  RECOMMENDATIONS: This patient's condition is appropriate for continued rehabilitative care in the following setting: Recommend CIR after completion of medical workup if patient willing to stay in the hospital and be compliant with recommendations. Patient has agreed to participate in recommended program. Potentially Note that insurance prior authorization may be required for reimbursement for recommended care.  Comment: Rehab Admissions Coordinator to follow up.  Delice Lesch, MD, ABPMR 01/23/2018

## 2018-01-24 LAB — CBC
HEMATOCRIT: 43.3 % (ref 39.0–52.0)
Hemoglobin: 14.3 g/dL (ref 13.0–17.0)
MCH: 28.5 pg (ref 26.0–34.0)
MCHC: 33 g/dL (ref 30.0–36.0)
MCV: 86.3 fL (ref 78.0–100.0)
Platelets: 129 10*3/uL — ABNORMAL LOW (ref 150–400)
RBC: 5.02 MIL/uL (ref 4.22–5.81)
RDW: 15.5 % (ref 11.5–15.5)
WBC: 6.9 10*3/uL (ref 4.0–10.5)

## 2018-01-24 LAB — BASIC METABOLIC PANEL
ANION GAP: 9 (ref 5–15)
BUN: 14 mg/dL (ref 6–20)
CALCIUM: 9 mg/dL (ref 8.9–10.3)
CO2: 30 mmol/L (ref 22–32)
Chloride: 100 mmol/L — ABNORMAL LOW (ref 101–111)
Creatinine, Ser: 1.34 mg/dL — ABNORMAL HIGH (ref 0.61–1.24)
GFR calc Af Amer: 54 mL/min — ABNORMAL LOW (ref >60)
GFR calc non Af Amer: 47 mL/min — ABNORMAL LOW (ref >60)
GLUCOSE: 107 mg/dL — AB (ref 65–99)
Potassium: 4.1 mmol/L (ref 3.5–5.1)
Sodium: 139 mmol/L (ref 135–145)

## 2018-01-24 MED ORDER — SENNOSIDES-DOCUSATE SODIUM 8.6-50 MG PO TABS: 2.0000 | ORAL_TABLET | Freq: Once | ORAL | Status: DC

## 2018-01-24 MED ORDER — POLYETHYLENE GLYCOL 3350 17 G PO PACK
17.0000 g | PACK | Freq: Every day | ORAL | Status: DC
  Administered 2018-01-25: 17 g via ORAL
  Filled 2018-01-24: qty 1

## 2018-01-24 NOTE — Progress Notes (Signed)
PROGRESS NOTE  Patrick Maxwell QMG:[id number] DOB: May 14, 1931 DOA: 01/20/2018 PCP: [name], MD   LOS: 3 days   Brief Narrative / Interim history: 84 y.o. male with a history of CAD with last cardiac catheterization May 2017, ischemic cardiomyopathy with chronic systolic heart failure with with last echo in November 13, 2017 showing ejection fraction 35 to 40%, history of bradycardia, history of PVCs PACs low at heart care, Dr. Serita Sheller, hypertension, hyperlipidemia last LDL in February 2019 at 114, history of CVA 10 years ago, prior to the emergency department in for evaluation of the onset of vertigo, and left-sided tingling.  He was found to have a CVA and was admitted to the hospital  Assessment & Plan: Principal Problem:   Acute CVA (cerebrovascular accident) [facility]) Active Problems:   History of CVA (cerebrovascular accident) without residual deficits   Hypertension, benign   Hypercholesteremia   Subclinical hypothyroidism   Palpitations   Acute combined systolic and diastolic congestive heart failure (HCC)   PVC's (premature ventricular contractions)   Coronary artery disease involving native coronary artery of native heart with unstable angina pectoris ([place])   BPH with elevated PSA   Bradycardia   Cerebellar infarct ([place])   Leukocytosis   Prediabetes   Acute CVA -Neurology consulted, appreciate input.  MRI on admission showed acute left posterior inferior cerebral artery territory infarct -CT angiogram negative for large vessel occlusion, the left PICA is partially thrombosed beginning at its origin -2D echo completed on 5/2 showed an EF of 30 to 35%, diffuse hypokinesis -Neurology recommends a TEE as well as a loop recorder -LDL 97, high-dose statin -A1c 5.7 -Placed on aspirin and Plavix for 3 weeks, followed by Plavix alone -PT recommending CIR, awaiting final decision -d/w Dr. Erlinda Hong with neurology, patient might benefit from anticoagulation, will discuss with  cardiology as well  -Consulted today, discussed with Dr. Bronson Ing  History of premature ventricular contractions  -followed by cardiology, as above will need loop recorder, will also evaluate for underlying paroxysmal A. Fib -Reviewed telemetry, high burden of PVCs, patient symptomatic at times -Cardiology to evaluate regarding anticoagulation as well as the high burden PVCs  Chronic combined systolic and diastolic CHF -Appears euvolemic, troponin negative -At home he is on Coreg, Lasix, Cozaar, hold Lasix and Cozaar   Coronary artery disease/ischemic cardiomyopathy -No chest pain, continue aspirin, Plavix, high-dose statin.  Chronic kidney disease stage III -Creatinine remains at baseline  Hypertension -Blood pressure within normal parameters this morning 129/82  Hyperlipidemia -High-dose statin as above  BPH -Continue Flomax  DVT prophylaxis: Heparin Code Status: Full code Family Communication: Son present at bedside Disposition Plan: CIR evaluation pending  Consultants:   Neurology  Cardiology  Procedures:   2D echo:  Study Conclusions - Left ventricle: The cavity size was moderately dilated. Wall thickness was normal. Systolic function was moderately to severely reduced. The estimated ejection fraction was in the range of 30% to 35%. Diffuse hypokinesis. Cannot exclude apical thrombus on non-contrast image. The study is not technically sufficient to allow evaluation of LV diastolic function. - Aortic valve: Trileaflet. Sclerosis without stenosis. There was trivial regurgitation. - Mitral valve: Mildly thickened leaflets . There was moderate regurgitation. - Left atrium: The atrium was mildly dilated. - Right ventricle: The cavity size was mildly dilated. - Tricuspid valve: There was mild regurgitation. - Pulmonary arteries: PA peak pressure: 59 mm Hg (S). - Inferior vena cava: The vessel was dilated. The respirophasic diameter changes were blunted (< 50%),  consistent  with elevated central venous pressure.   Antimicrobials:  None    Subjective: -He complains about the hospital food not having any taste.  No chest pain.  Occasional palpitations still present.  No shortness of breath.  He is still dizzy when he stands  Objective: Vitals:   01/23/18 2312 01/24/18 0100 01/24/18 0342 01/24/18 0911  BP: 124/69  126/78 129/82  Pulse: (!) 41  (!) 42 (!) 43  Resp: 18  20 18   Temp: 98.8 F (37.1 C)  97.8 F (36.6 C) 97.8 F (36.6 C)  TempSrc: Oral  Oral Oral  SpO2: 93%  93% 98%  Weight:  88.1 kg (194 lb 3.6 oz)    Height:        Intake/Output Summary (Last 24 hours) at 01/24/2018 0926 Last data filed at 01/24/2018 0700 Gross per 24 hour  Intake 240 ml  Output 1075 ml  Net -835 ml   Filed Weights   01/21/18 1737 01/24/18 0100  Weight: 86.4 kg (190 lb 7.6 oz) 88.1 kg (194 lb 3.6 oz)    Examination:  Constitutional: No apparent distress Eyes: No scleral icterus, lids and conjunctivae normal ENMT: Moist mucous membranes Respiratory: Clear to auscultation bilaterally, no wheezing Cardiovascular: Regular rate and rhythm with extra beats, no murmurs Abdomen: Bowel sounds positive Skin: No rashes seen Neurologic: Equal strength, no focal findings   Data Reviewed: I have independently reviewed following labs and imaging studies   CBC: Recent Labs  Lab 01/20/18 2324 01/24/18 0300  WBC 11.6* 6.9  HGB 14.5 14.3  HCT 43.4 43.3  MCV 86.8 86.3  PLT 139* 267*   Basic Metabolic Panel: Recent Labs  Lab 01/20/18 2324 01/21/18 0030 01/24/18 0300  NA 139  --  139  K 4.1  --  4.1  CL 108  --  100*  CO2 21*  --  30  GLUCOSE 215*  --  107*  BUN 22*  --  14  CREATININE 1.29*  --  1.34*  CALCIUM 8.9  --  9.0  MG  --  1.9  --    GFR: Estimated Creatinine Clearance: 43.7 mL/min (A) (by C-G formula based on SCr of 1.34 mg/dL (H)). Liver Function Tests: No results for input(s): AST, ALT, ALKPHOS, BILITOT, PROT, ALBUMIN in the last  168 hours. No results for input(s): LIPASE, AMYLASE in the last 168 hours. No results for input(s): AMMONIA in the last 168 hours. Coagulation Profile: No results for input(s): INR, PROTIME in the last 168 hours. Cardiac Enzymes: No results for input(s): CKTOTAL, CKMB, CKMBINDEX, TROPONINI in the last 168 hours. BNP (last 3 results) No results for input(s): PROBNP in the last 8760 hours. HbA1C: Recent Labs    01/22/18 0720  HGBA1C 5.7*   CBG: Recent Labs  Lab 01/20/18 2318  GLUCAP 170*   Lipid Profile: Recent Labs    01/22/18 0720  CHOL 151  HDL 41  LDLCALC 97  TRIG 64  CHOLHDL 3.7   Thyroid Function Tests: No results for input(s): TSH, T4TOTAL, FREET4, T3FREE, THYROIDAB in the last 72 hours. Anemia Panel: No results for input(s): VITAMINB12, FOLATE, FERRITIN, TIBC, IRON, RETICCTPCT in the last 72 hours. Urine analysis:    Component Value Date/Time   COLORURINE YELLOW 01/20/2018 2311   APPEARANCEUR HAZY (A) 01/20/2018 2311   LABSPEC 1.019 01/20/2018 2311   PHURINE 5.0 01/20/2018 2311   GLUCOSEU NEGATIVE 01/20/2018 2311   HGBUR SMALL (A) 01/20/2018 2311   BILIRUBINUR NEGATIVE 01/20/2018 2311   KETONESUR NEGATIVE 01/20/2018  [place] (A) 01/20/2018 2311   UROBILINOGEN 1.0 10/06/2009 1406   NITRITE NEGATIVE 01/20/2018 2311   LEUKOCYTESUR NEGATIVE 01/20/2018 2311   Sepsis Labs: Invalid input(s): PROCALCITONIN, LACTICIDVEN  No results found for this or any previous visit (from the past 240 hour(s)).    Radiology Studies: No results found.   Scheduled Meds: . aspirin EC  81 mg Oral Daily  . atorvastatin  40 mg Oral q1800  . carvedilol  3.125 mg Oral BID  . cholecalciferol  5,000 Units Oral Daily  . clopidogrel  75 mg Oral Daily  . heparin  5,000 Units Subcutaneous Q8H  . tamsulosin  0.4 mg Oral Daily   Continuous Infusions:   Marzetta Board, MD, PhD [facility] Pager [phone]  If 7PM-7AM, please contact  night-coverage [email or web address] Password [email or web address] 01/24/2018, 9:26 AM

## 2018-01-24 NOTE — Progress Notes (Addendum)
NEUROHOSPITALISTS STROKE TEAM - DAILY PROGRESS NOTE    HISTORY Patrick Remedios Lineberryis a 84 y.o.male who presented with a sudden onset of vertigo while in bed on evening of 5/1. He describes that as room spinning. He was unable to maintain his balance. He tried to stand up but could not stand or walk. He had to crawl out of his bed to come to the door to call out for help from his wife. He denied any nausea vomiting. Denies any loss of consciousness or lightheadedness. The symptoms have since improved but have not gone away.  Chief Complaint: "not dizzy right now"; in sitting position in bed.  Interval History: Speech sounds clear, denies headache dizziness at this time, nausea, vomiting, ocular pain, diplopia. When he sits up out of bed or stands, he lists to the left and has return of dizziness. Awaiting TEE and cardiology consult  OBJECTIVE Most recent Vital Signs: Vitals:   01/23/18 2312 01/24/18 0100 01/24/18 0342 01/24/18 0911  BP: 124/69  126/78 129/82  Pulse: (!) 41  (!) 42 (!) 43  Resp: 18  20 18   Temp: 98.8 F (37.1 C)  97.8 F (36.6 C) 97.8 F (36.6 C)  TempSrc: Oral  Oral Oral  SpO2: 93%  93% 98%  Weight:  88.1 kg (194 lb 3.6 oz)    Height:       CBG (last 3)  No results for input(s): GLUCAP in the last 72 hours.  Physical Exam  HEENT-  Normocephalic, no lesions, without obvious abnormality.  Normal external eye and conjunctiva.   Cardiovascular- Bradycardic rhythm, S1-S2 audible, pulses palpable throughout   Lungs-no rhonchi or wheezing noted, no excessive working breathing.  Abdomen- All 4 quadrants palpated and nontender Musculoskeletal-no joint tenderness Skin-warm and dry  Neuro:  Mental Status: Alert, oriented, thought content appropriate.  Dysarthria slightly detected, but as he goes on in conversation it improves and becomes clear.  Able to follow 3 step commands without difficulty. Cranial  Nerves: II:  Visual fields grossly normal,  III,IV, VI: ptosis not present, extra-ocular motions intact bilaterally pupils equal, round, reactive to light  V,VII: smile symmetric, facial light touch sensation normal bilaterally VIII: hearing intact to voice IX,X: uvula rises symmetrically XI: bilateral shoulder shrug XII: midline tongue extension Motor: Right : Upper extremity   5/5    Left:     Upper extremity   5/5  Lower extremity   5/5     Lower extremity   5/5 Tone and bulk:normal tone throughout; no atrophy noted Sensory: admits to decreased sensation in left fingers Deep Tendon Reflexes: 2+ and symmetric throughout Plantars: Right: downgoing   Left: downgoing Cerebellar: Pt with trunkal ataxia; normal finger-to-nose, slightly off with left heal to shin Gait:Defered  IV Fluid Intake:    MEDICATIONS  . aspirin EC  81 mg Oral Daily  . atorvastatin  40 mg Oral q1800  . carvedilol  3.125 mg Oral BID  . cholecalciferol  5,000 Units Oral Daily  . clopidogrel  75 mg Oral Daily  . heparin  5,000 Units Subcutaneous Q8H  . tamsulosin  0.4 mg Oral Daily   PRN:  acetaminophen **OR** acetaminophen (TYLENOL) oral liquid 160 mg/5  mL **OR** acetaminophen, hydrALAZINE, meclizine, senna-docusate  Diet:   Diet Order           Diet Heart Room service appropriate? Yes; Fluid consistency: Thin  Diet effective now          CLINICALLY SIGNIFICANT STUDIES Basic Metabolic Panel:  Recent Labs  Lab 01/20/18 2324 01/21/18 0030 01/24/18 0300  NA 139  --  139  K 4.1  --  4.1  CL 108  --  100*  CO2 21*  --  30  GLUCOSE 215*  --  107*  BUN 22*  --  14  CREATININE 1.29*  --  1.34*  CALCIUM 8.9  --  9.0  MG  --  1.9  --    CBC:  Recent Labs  Lab 01/20/18 2324 01/24/18 0300  WBC 11.6* 6.9  HGB 14.5 14.3  HCT 43.4 43.3  MCV 86.8 86.3  PLT 139* 129*   CoaUrinalysis:  Recent Labs  Lab 01/20/18 2311  COLORURINE YELLOW  LABSPEC 1.019  PHURINE 5.0  GLUCOSEU NEGATIVE  HGBUR  SMALL*  BILIRUBINUR NEGATIVE  KETONESUR NEGATIVE  PROTEINUR 30*  NITRITE NEGATIVE  LEUKOCYTESUR NEGATIVE   Lipid Panel    Component Value Date/Time   CHOL 151 01/22/2018 0720   TRIG 64 01/22/2018 0720   HDL 41 01/22/2018 0720   CHOLHDL 3.7 01/22/2018 0720   VLDL 13 01/22/2018 0720   LDLCALC 97 01/22/2018 0720   LDLCALC 114 (H) 11/09/2017 0841   HgbA1C  Lab Results  Component Value Date   HGBA1C 5.7 (H) 01/22/2018   CT Angio Head/Neck W Or Wo Contrast   01/21/2018 IMPRESSION:  1. Negative for large vessel occlusion. 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  2. There is little extracranial atherosclerosis, with no cervical carotid or vertebral artery stenosis. But there is moderate generalized intracranial atherosclerosis. Mostly mild intracranial arterial stenoses are noted - including in both vertebral artery V4 segments - with the following exceptions: - moderate right ICA siphon supraclinoid stenosis due to calcified plaque. - moderate right MCA bifurcation stenosis. - moderate left ACA A2 segment stenosis. - moderate and severe left MCA anterior division M3 stenoses.  3. Stable appearance of the acute left PICA infarct from the MRI earlier today. Cytotoxic edema with no hemorrhage or mass effect at this time.  4. Aortic Atherosclerosis (ICD10-I70.0).   MR Brain Wo Contrast  01/21/2018 IMPRESSION: 1. Acute left posterior inferior cerebellar artery territory infarct without hemorrhage or mass effect  2. Chronic small vessel disease with old left basal ganglia and corona radiata lacunar infarcts.   Dg Chest Portable 1 View: 01/21/2018 IMPRESSION:  Cardiomegaly with aortic atherosclerosis.  Mild CHF.  2D Echocardiogram 01/21/18 Compared to a prior study in 10/2017, the LVEF is lower at 30-35%.   Cannot exclude apical thrombus - recommend limited Definity   contrast study to evaluate.  EKG: SR with bigeminy.   Outstanding Stroke Work-up Studies:     TEE  /Loop Recorder:                                             PENDING  Therapy Recommendations: CIR depending on progress VTE Prophylaxis: Heparin SQ  ASSESSMENT Patrick Maxwell is a 84 y.o. man w/dysarthria and dizziness due to left posterior inferior cerebellar artery infarct. Vascular risk factors of hypertension, CAD, hyperlipidemia, CHF and  mild obesity  1. 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   -Echocardiogram showed a reduced ejection fraction as compared to previous, but cannot exclude apical thrombus. will recommend for a TEE at this time  - Tele: Bigem, PVCs, NSR  - Continue secondary stroke prevention with dual antiplatelet therapy of aspirin and Plavix for 3 weeks, followed by Plavix alone for now.  - However, anticoagulation may be indicated given the decreased EF and ischemic cardiomyopathy. Will need to confer with Cardiology.  2.  HLD- LDL 97, goal <70. Continue statin 3.  Hypertension- normalize BP at this time 4.  CKD stage II- trend labe and avoid nephrotoxic drugs 5.  Coronary artery disease/ischemic cardiomyopathy/combined chronic systolic and diastolic CHF  Hospital day # 4  PLAN/RECOMMENDATIONS  Normalize BP now  Telemetry monitoring  Cardiology consult  TEE tomorrow to look for thrombus, will need to be NPO after MN  Loop recorder may not be needed, pending further wk up on cardiomyopathy etc by cards  Frequent neuro checks  Continue dual antiplatelets with aspirin and Plavix x3 weeks and then continue along with Plavix, unless OAC is indicated  Atorvastatin 40 mg PO daily  Risk factor modification  Rehab efforts as tol   Avoid nephrotoxic drugs  Continue cardio protective medications  Updated RN and son at bedside on above plan  Dispo: CIR when wk up completed    ATTENDING NOTE: I reviewed above note and agree with the assessment and plan. I have made any additions or clarifications  directly to the above note. Pt was seen and examined.   Pt sitting in bed comfortably.  Wife and son are at bedside.  No acute event overnight.  Patient ambulate in room and go to bathroom, still feel some wobbly, but no fall.  Discussed with Dr. Percival Spanish, will repeat 2D echo with contrast today, if no significant finding, likely TEE to rule out LV or LA thrombus. If still unrevealing, will consider loop recorder. Continue dual antiplatelet and Lipitor for stroke prevention for now.  PT/OT therapy.   Rosalin Hawking, MD PhD Stroke Neurology 01/24/2018 3:35 PM      To contact Stroke Continuity provider, please refer to [email or web address] After hours, contact General Neurology

## 2018-01-24 NOTE — Progress Notes (Signed)
Occupational Therapy Treatment Patient Details Name: Patrick Maxwell MRN: [id number] DOB: Feb 14, 1931 Today's Date: 01/24/2018    History of present illness Mr. OSHAY STRANAHAN is a 84 y.o. male with onset of dysarthria and dizziness due to left posterior inferior cerebellar artery infarctstroke, HTN, PVCs, CHF, and CAD.    OT comments  Pt making steady progress. Able to mobilize to the sink with mod A with use of facilitationin of midline postural control and weight shifts. Attempting to use LUE for functional tasks, although ataxia noted.  Grandson present for session. Continue to recommend CIR for rehab. Pt very motivated to return to PLOF.   Follow Up Recommendations  CIR    Equipment Recommendations  Other (comment)(TBA)    Recommendations for Other Services      Precautions / Restrictions Precautions Precautions: Fall Precaution Comments: left sided ataxia and left lateral lean       Mobility Bed Mobility Overal bed mobility: Needs Assistance Bed Mobility: Supine to Sit;Sit to Supine     Supine to sit: Supervision Sit to supine: Supervision   General bed mobility comments: Given visual target during mobility  Transfers Overall transfer level: Needs assistance Equipment used: 1 person hand held assist Transfers: Sit to/from [employer] Transfers Sit to Stand: Mod assist(L bias) Stand pivot transfers: Mod assist            Balance Overall balance assessment: Needs assistance   Sitting balance-Leahy Scale: Fair       Standing balance-Leahy Scale: Poor Standing balance comment: Able to achieve midline postrual control with firm feedback gien to chest and lower back                           ADL either performed or assessed with clinical judgement   ADL Overall ADL's : Needs assistance/impaired     Grooming: Standing;Minimal assistance   Upper Body Bathing: Set up;Supervision/ safety;Sitting   Lower Body Bathing: Moderate  assistance;Sit to/from stand   Upper Body Dressing : Minimal assistance;Sitting   Lower Body Dressing: Moderate assistance;Sit to/from stand   Toilet Transfer: Moderate assistance;Ambulation Toilet Transfer Details (indicate cue type and reason): ambulated to sink with therapist waslking on pt's left side with pt's L arm over shoulder to facilitate step through pattern and weight shift Toileting- Clothing Manipulation and Hygiene: Moderate assistance       Functional mobility during ADLs: Moderate assistance General ADL Comments: Facilitaed control of movement patterns during ADL tasks. Pt with good ability to use feedback to modify movement.     Vision       Perception     Praxis      Cognition Arousal/Alertness: Awake/alert Behavior During Therapy: WFL for tasks assessed/performed Overall Cognitive Status: Within Functional Limits for tasks assessed                                          Exercises Exercises: Other exercises Other Exercises Other Exercises: Pt educated on BUE integrated activities to complete at bed level. Grandson verbalized understanding  Repetitive stepping activity with R/L to address postural control   Shoulder Instructions       General Comments      Pertinent Vitals/ Pain       Pain Assessment: No/denies pain  Home Living  Prior Functioning/Environment              Frequency  Min 2X/week        Progress Toward Goals  OT Goals(current goals can now be found in the care plan section)  Progress towards OT goals: Progressing toward goals  Acute Rehab OT Goals Patient Stated Goal: to get back to normal OT Goal Formulation: With patient Time For Goal Achievement: 02/05/18 ADL Goals Pt Will Perform Grooming: with supervision;standing Pt Will Perform Lower Body Dressing: with supervision;sit to/from stand Pt Will Transfer to Toilet: with  supervision;ambulating;regular height toilet Pt Will Perform Toileting - Clothing Manipulation and hygiene: with supervision;sit to/from stand Pt Will Perform Tub/Shower Transfer: ambulating;Shower transfer  Plan Discharge plan remains appropriate    Co-evaluation                 AM-PAC PT "6 Clicks" Daily Activity     Outcome Measure   Help from another person eating meals?: None Help from another person taking care of personal grooming?: A Little Help from another person toileting, which includes using toliet, bedpan, or urinal?: A Lot Help from another person bathing (including washing, rinsing, drying)?: A Lot Help from another person to put on and taking off regular upper body clothing?: A Little Help from another person to put on and taking off regular lower body clothing?: A Lot 6 Click Score: 16    End of Session Equipment Utilized During Treatment: Gait belt  OT Visit Diagnosis: Unsteadiness on feet (R26.81);Other abnormalities of gait and mobility (R26.89);Muscle weakness (generalized) (M62.81);Ataxia, unspecified (R27.0);Dizziness and giddiness (R42)   Activity Tolerance Patient tolerated treatment well   Patient Left in bed;with call bell/phone within reach;with family/visitor present   Nurse Communication Mobility status        Time: 4076-8088 OT Time Calculation (min): 24 min  Charges: OT General Charges $OT Visit: 1 Visit OT Treatments $Self Care/Home Management : 23-37 mins  [facility], OT/L  [phone] 01/24/2018   Ysmael Hires,HILLARY 01/24/2018, 7:03 PM

## 2018-01-24 NOTE — Consult Note (Addendum)
Cardiology Consultation:   Patient ID: VADEN BECHERER; [id number]; January 19, 1931   Admit date: 01/20/2018 Date of Consult: 01/24/2018  Primary Care Provider: [name], MD Primary Cardiologist: Lauree Chandler, MD  Primary Electrophysiologist:     Patient Profile:   Patrick Maxwell is a 84 y.o. male with a hx of CAD s/p DES to LAD/OM! 01/2016, ischemic cardiomyopathy, chronic systolic heart failure, HTN, HLD, CVA, and PVCs by holter on BB  who is being seen today for the evaluation of palpitations at the request of Dr. Cruzita Lederer.  History of Present Illness:   Patrick Maxwell has a complex cardiac history and was recenlty seen in clinic on 12/03/17. He has a recent history of DES to LAD/OM1 01/2016. Echo prior to PCI with LVEF 30-35%, repeat echo 04/2016 with LVEF improvement to 40-45%. Coreg was discontinued in 2017 due to bradycardia. IN 11/2017, he had an increase in PVCs, confirmed by Holter monitor, and low dose of coreg was restarted. EF was reduced to 35-40%, thought to be mediated by tachy arrhythmia. At follow up, he was tolerating Coreg well. Due to side effects of lisinopril, he was switched to losartan. Lasix continued.   He was in his usual state of health until 8pm on 01/20/18 when he experienced severe vertigo. He was brought to the ER with continued vertigo/room spinning with ataxia. He was found to have an acute ischemic stroke in the cerebellum (left PICA territory). He was started on ASA and plavix x 3 weeks, then continuation of plavix. Echo could not exclude apical thrombus.   On my interview, he denies palpitations, chest pain, shortness of breath, and lower extremity swelling. He states 2 days ago he felt his heart beating harder, but denies palpitations and chest pain. He is resting comfortably in bed without complaints.   Past Medical History:  Diagnosis Date  . CAD (coronary artery disease)   . CHF (congestive heart failure) ([place])   . Frequent unifocal PVCs    Palpitations since early 20s, PVCs and pairs  . Hyperlipidemia   . Hypertension   . Stroke [facility]) 2009    Past Surgical History:  Procedure Laterality Date  . CARDIAC CATHETERIZATION N/A 01/23/2016   Procedure: Right/Left Heart Cath and Coronary Angiography;  Surgeon: Burnell Blanks, MD;  Location: [facility];  Service: Cardiovascular;  Laterality: N/A;  . CARDIAC CATHETERIZATION N/A 01/23/2016   Procedure: Coronary Stent Intervention;  Surgeon: Burnell Blanks, MD;  Location: [facility];  Service: Cardiovascular;  Laterality: N/A;  . HERNIA REPAIR  06/1992     Home Medications:  Prior to Admission medications   Medication Sig Start Date End Date Taking? Authorizing Provider  Ascorbic Acid (VITAMIN C PO) Take 250 mg by mouth 3 (three) times a week.    Yes [provider]  aspirin 81 MG tablet Take 81 mg by mouth daily.    Yes [provider]  carvedilol (COREG) 3.125 MG tablet Take 1 tablet (3.125 mg total) by mouth 2 (two) times daily. 12/01/17 03/01/18 Yes Burnell Blanks, MD  Cholecalciferol (VITAMIN D PO) Take 5,000 Units by mouth daily.   Yes [provider]  Coenzyme Q10 (COQ10) 100 MG CAPS Take 1 capsule by mouth daily.   Yes [provider]  furosemide (LASIX) 20 MG tablet TAKE ONE TABLET BY MOUTH ONCE DAILY 11/24/17  Yes Overly, Modena Nunnery, MD  losartan (COZAAR) 25 MG tablet Take 0.5 tablets (12.5 mg total) by mouth daily. 12/03/17  Yes Lyda Jester M, PA-C  Magnesium 100 MG TABS Take 1 tablet by mouth 2 (two) times daily.   Yes [provider]  Multiple Vitamin (MULTIVITAMIN WITH MINERALS) TABS tablet Take 1 tablet by mouth daily.   Yes [provider]  nitroGLYCERIN (NITROSTAT) 0.4 MG SL tablet Place 1 tablet (0.4 mg total) under the tongue every 5 (five) minutes as needed. 06/25/17  Yes Lyda Jester M, PA-C  Potassium 99 MG TABS Take 99 mg by mouth daily.    Yes [provider]  tamsulosin (FLOMAX) 0.4 MG CAPS capsule Take 1 capsule (0.4 mg total) by mouth daily. 01/01/18  Yes Varnado, Modena Nunnery, MD  vitamin E 400 UNIT capsule Take 400 Units by mouth 3 (three) times a week.   Yes [provider]  atorvastatin (LIPITOR) 20 MG tablet Take 1 tablet (20 mg total) by mouth daily. Patient not taking: Reported on 01/21/2018 12/03/17   Lyda Jester M, PA-C  benzonatate (TESSALON) 100 MG capsule Take 1 capsule (100 mg total) by mouth 3 (three) times daily as needed for cough. Patient not taking: Reported on 01/21/2018 12/07/17   [name], MD    Inpatient Medications: Scheduled Meds: . aspirin EC  81 mg Oral Daily  . atorvastatin  40 mg Oral q1800  . carvedilol  3.125 mg Oral BID  . cholecalciferol  5,000 Units Oral Daily  . clopidogrel  75 mg Oral Daily  . heparin  5,000 Units Subcutaneous Q8H  . tamsulosin  0.4 mg Oral Daily   Continuous Infusions:  PRN Meds: acetaminophen **OR** acetaminophen (TYLENOL) oral liquid 160 mg/5 mL **OR** acetaminophen, hydrALAZINE, meclizine, senna-docusate  Allergies:   No Known Allergies  Social History:   Social History   Socioeconomic History  . Marital status: Married    Spouse name: Not on file  . Number of children: Not on file  . Years of education: Not on file  . Highest education level: Not on file  Occupational History  . Not on file  Social Needs  . Financial resource strain: Not on file  . Food insecurity:    Worry: Not on file    Inability: Not on file  . Transportation needs:    Medical: Not on file    Non-medical: Not on file  Tobacco Use  . Smoking status: Never Smoker  . Smokeless tobacco: Never Used  Substance and Sexual Activity  . Alcohol use: No  . Drug use: No  . Sexual activity: Not Currently  Lifestyle  . Physical activity:    Days per week: Not on file    Minutes per session: Not on file  . Stress: Not on file  Relationships  . Social connections:    Talks on phone:  Not on file    Gets together: Not on file    Attends religious service: Not on file    Active member of club or organization: Not on file    Attends meetings of clubs or organizations: Not on file    Relationship status: Not on file  . Intimate partner violence:    Fear of current or ex partner: Not on file    Emotionally abused: Not on file    Physically abused: Not on file    Forced sexual activity: Not on file  Other Topics Concern  . Not on file  Social History Narrative  . Not on file    Family History:    Family History  Problem Relation Age  of Onset  . Stroke Mother   . Early death Mother   . Cancer Brother      ROS:  Please see the history of present illness.   All other ROS reviewed and negative.     Physical Exam/Data:   Vitals:   01/23/18 2312 01/24/18 0100 01/24/18 0342 01/24/18 0911  BP: 124/69  126/78 129/82  Pulse: (!) 41  (!) 42 (!) 43  Resp: 18  20 18   Temp: 98.8 F (37.1 C)  97.8 F (36.6 C) 97.8 F (36.6 C)  TempSrc: Oral  Oral Oral  SpO2: 93%  93% 98%  Weight:  194 lb 3.6 oz (88.1 kg)    Height:        Intake/Output Summary (Last 24 hours) at 01/24/2018 1029 Last data filed at 01/24/2018 0700 Gross per 24 hour  Intake 240 ml  Output 1075 ml  Net -835 ml   Filed Weights   01/21/18 1737 01/24/18 0100  Weight: 190 lb 7.6 oz (86.4 kg) 194 lb 3.6 oz (88.1 kg)   Body mass index is 27.09 kg/m.  General:  Well nourished, well developed, in no acute distress HEENT: normal Lymph: no adenopathy Neck: no JVD Endocrine:  No thryomegaly Vascular: No carotid bruits; FA pulses 2+ bilaterally without bruits  Cardiac:  Irregular rhythm, regular rate, no mumur Lungs:  clear to auscultation bilaterally, no wheezing, rhonchi or rales  Abd: soft, nontender, no hepatomegaly  Ext: no edema Musculoskeletal:  No deformities, BUE and BLE strength normal and equal Skin: warm and dry  Psych:  Normal affect   EKG:  The EKG was personally reviewed and  demonstrates:  PVC, bigeminy Telemetry:  Telemetry was personally reviewed and demonstrates:  PVCs, no Afib  Relevant CV Studies:  Echo 01/21/18: Study Conclusions - Left ventricle: The cavity size was moderately dilated. Wall   thickness was normal. Systolic function was moderately to   severely reduced. The estimated ejection fraction was in the   range of 30% to 35%. Diffuse hypokinesis. Cannot exclude apical   thrombus on non-contrast image. The study is not technically   sufficient to allow evaluation of LV diastolic function. - Aortic valve: Trileaflet. Sclerosis without stenosis. There was   trivial regurgitation. - Mitral valve: Mildly thickened leaflets . There was moderate   regurgitation. - Left atrium: The atrium was mildly dilated. - Right ventricle: The cavity size was mildly dilated. - Tricuspid valve: There was mild regurgitation. - Pulmonary arteries: PA peak pressure: 59 mm Hg (S). - Inferior vena cava: The vessel was dilated. The respirophasic   diameter changes were blunted (< 50%), consistent with elevated   central venous pressure.  Impressions: - Compared to a prior study in 10/2017, the LVEF is lower at 30-35%.   Cannot exclude apical thrombus - recommend limited Definity   contrast study to evaluate.   Holter 11/25/17: Recs: PVCs - restarted coreg  Sinus rhythm Frequent Premature ventricular contractions, ventricular bigeminy with couplets and triplets Frequent Premature atrial contractions.    Right and left heart cath 01/23/16:  Mid LAD lesion, 99% stenosed.  Mid LAD to Dist LAD lesion, 20% stenosed.  Mid RCA to Dist RCA lesion, 20% stenosed.  RPDA lesion, 20% stenosed.  Ost LAD to Prox LAD lesion, 50% stenosed. Post intervention, there is a 0% residual stenosis.  2nd Mrg lesion, 80% stenosed. Post intervention, there is a 0% residual stenosis.   1. Severe double vessel CAD 2. Unstable angina.  3. Ischemic cardiomyopathy 4. Severe  stenosis  mid LAD. Now s/p successful PTCA/DES x 1 proximal and mid LAD 5. Severe stenosis mid obtuse marginal. Now s/p successful PTCA/DES x 1 OM1.   Recommendations: He will need DAPT with ASA and Plavix for at least one month. Will start statin. Continue beta blocker.    Laboratory Data:  Chemistry Recent Labs  Lab 01/20/18 2324 01/24/18 0300  NA 139 139  K 4.1 4.1  CL 108 100*  CO2 21* 30  GLUCOSE 215* 107*  BUN 22* 14  CREATININE 1.29* 1.34*  CALCIUM 8.9 9.0  GFRNONAA 49* 47*  GFRAA 57* 54*  ANIONGAP 10 9    No results for input(s): PROT, ALBUMIN, AST, ALT, ALKPHOS, BILITOT in the last 168 hours. Hematology Recent Labs  Lab 01/20/18 2324 01/24/18 0300  WBC 11.6* 6.9  RBC 5.00 5.02  HGB 14.5 14.3  HCT 43.4 43.3  MCV 86.8 86.3  MCH 29.0 28.5  MCHC 33.4 33.0  RDW 15.8* 15.5  PLT 139* 129*   Cardiac EnzymesNo results for input(s): TROPONINI in the last 168 hours.  Recent Labs  Lab 01/21/18 0139  TROPIPOC 0.01    BNP Recent Labs  Lab 01/21/18 0137  BNP 2,431.3*    DDimer No results for input(s): DDIMER in the last 168 hours.  Radiology/Studies:  Ct Angio Head W Or Wo Contrast  Result Date: 01/21/2018 CLINICAL DATA:  84 year old male with vertigo and acute left PICA territory infarct affecting left cerebellum on MRI earlier today. EXAM: CT ANGIOGRAPHY HEAD AND NECK TECHNIQUE: Multidetector CT imaging of the head and neck was performed using the standard protocol during bolus administration of intravenous contrast. Multiplanar CT image reconstructions and MIPs were obtained to evaluate the vascular anatomy. Carotid stenosis measurements (when applicable) are obtained utilizing NASCET criteria, using the distal internal carotid diameter as the denominator. CONTRAST:  60mL ISOVUE-370 IOPAMIDOL (ISOVUE-370) INJECTION 76% COMPARISON:  Brain MRI 0459 hours today. FINDINGS: CT HEAD Brain: Confluent hypodensity in the medial inferior left cerebellum corresponding to the area of  diffusion abnormality on MRI earlier today (series 5, image 6). No associated hemorrhage. No posterior fossa mass effect. The 4th ventricle is patent. Hypodensity in the supratentorial brain especially the left deep gray matter nuclei corresponds to chronic MRI signal today. No midline shift, ventriculomegaly, mass effect, evidence of mass lesion, intracranial hemorrhage. Calvarium and skull base: No acute osseous abnormality identified. Paranasal sinuses: Clear. Orbits: Negative orbit and scalp soft tissues. CTA NECK Skeleton: No acute osseous abnormality identified. Cervical spine degeneration. Incidental right maxillary sinus alveolar recess mucous retention cysts. Upper chest: Questionable trace layering right pleural effusion. Questionable mild centrilobular emphysema in the upper lungs. There is mild dependent atelectasis, and also mild apical septal thickening bilaterally. No superior mediastinal lymphadenopathy. Other neck: Negative.  No neck mass or lymphadenopathy. Aortic arch: Moderate Calcified aortic atherosclerosis. 3 vessel arch configuration. Right carotid system: No brachiocephalic or right CCA origin stenosis despite some soft plaque. Mild proximal right CCA tortuosity. Mild calcified and soft plaque at the right carotid bifurcation with no cervical right ICA stenosis. Left carotid system: No left CCA stenosis despite some plaque. Tortuous proximal left CCA. Minimal plaque at the left carotid bifurcation. Mild soft and calcified plaque in the distal left ICA bulb. No cervical left ICA stenosis. Vertebral arteries: No proximal right subclavian artery stenosis despite mild plaque. Normal right vertebral artery origin. Tortuous right V1 segment. Patent right vertebral artery to the skull base without stenosis. No proximal left subclavian artery stenosis despite soft  and calcified plaque. The left vertebral artery origin is normal (series 9 image 141). Mild to moderate tortuosity of the left V1  segment. Tortuous proximal left V2 segment. The left vertebral artery is patent to the skull base without stenosis. CTA HEAD Posterior circulation: The left vertebral V4 segment is patent but demonstrates areas of mild to moderate irregularity suspicious for atherosclerosis. No hemodynamically significant distal left vertebral stenosis results. The left PICA origin appears partially thrombosed on series 11, image 147, but there is some preserved PICA enhancement. There is mild to moderate irregularity also in the right vertebral artery V4 segment compatible with atherosclerosis, no significant stenosis. Patent right PICA origin. Patent vertebrobasilar junction. Patent basilar artery with mild irregularity, no stenosis. Normal SCA and PCA origins. Posterior communicating arteries are diminutive or absent. Mild bilateral PCA irregularity compatible with atherosclerosis. Preserved distal PCA enhancement. Anterior circulation: Patent left ICA siphon with mild to moderate calcified plaque beginning in the cavernous segment. There is only mild distal left cavernous and proximal supraclinoid left ICA stenosis. Normal left ophthalmic artery origin. The right ICA siphon is patent with similar moderate calcified plaque. There is mild right cavernous but moderate proximal right supraclinoid ICA stenosis (series 11, image 88 and series 13 image 66). Normal right ophthalmic artery origin. The bilateral carotid termini are patent. MCA and ACA origins are patent with mild irregularity. Mild ACA A1 segment irregularity. Anterior communicating artery is normal. There is moderate distal left A2 segment irregularity and stenosis (series 16, image 21). Other ACA branches are normal to mildly irregular. The left MCA M1 segment is mildly irregular. The left MCA bifurcation is patent. There is mild anterior M2 and moderate to severe anterior M3 segment irregularity with stenoses (series 16, image 28). Mild left MCA branch irregularity  elsewhere. Right MCA M1 segment is mildly irregular and bifurcates early. There is up to moderate stenosis at the right MCA bifurcation best seen on series 16, image 15. Right MCA branches are mildly irregular. Venous sinuses: Patent. Anatomic variants: None. Delayed phase: No abnormal enhancement identified. The boundary of cytotoxic edema in the inferior left cerebellum is more apparent following contrast. Review of the MIP images confirms the above findings IMPRESSION: 1. Negative for large vessel occlusion. 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 2. There is little extracranial atherosclerosis, with no cervical carotid or vertebral artery stenosis. But there is moderate generalized intracranial atherosclerosis. Mostly mild intracranial arterial stenoses are noted - including in both vertebral artery V4 segments - with the following exceptions: - moderate right ICA siphon supraclinoid stenosis due to calcified plaque. - moderate right MCA bifurcation stenosis. - moderate left ACA A2 segment stenosis. - moderate and severe left MCA anterior division M3 stenoses. 3. Stable appearance of the acute left PICA infarct from the MRI earlier today. Cytotoxic edema with no hemorrhage or mass effect at this time. 4. Aortic Atherosclerosis (ICD10-I70.0). Electronically Signed   By: Genevie Ann M.D.   On: 01/21/2018 07:56   Ct Angio Neck W Or Wo Contrast  Result Date: 01/21/2018 CLINICAL DATA:  84 year old male with vertigo and acute left PICA territory infarct affecting left cerebellum on MRI earlier today. EXAM: CT ANGIOGRAPHY HEAD AND NECK TECHNIQUE: Multidetector CT imaging of the head and neck was performed using the standard protocol during bolus administration of intravenous contrast. Multiplanar CT image reconstructions and MIPs were obtained to evaluate the vascular anatomy. Carotid stenosis measurements (when applicable) are obtained utilizing NASCET criteria, using the distal internal carotid  diameter as the denominator. CONTRAST:  77mL ISOVUE-370 IOPAMIDOL (ISOVUE-370) INJECTION 76% COMPARISON:  Brain MRI 0459 hours today. FINDINGS: CT HEAD Brain: Confluent hypodensity in the medial inferior left cerebellum corresponding to the area of diffusion abnormality on MRI earlier today (series 5, image 6). No associated hemorrhage. No posterior fossa mass effect. The 4th ventricle is patent. Hypodensity in the supratentorial brain especially the left deep gray matter nuclei corresponds to chronic MRI signal today. No midline shift, ventriculomegaly, mass effect, evidence of mass lesion, intracranial hemorrhage. Calvarium and skull base: No acute osseous abnormality identified. Paranasal sinuses: Clear. Orbits: Negative orbit and scalp soft tissues. CTA NECK Skeleton: No acute osseous abnormality identified. Cervical spine degeneration. Incidental right maxillary sinus alveolar recess mucous retention cysts. Upper chest: Questionable trace layering right pleural effusion. Questionable mild centrilobular emphysema in the upper lungs. There is mild dependent atelectasis, and also mild apical septal thickening bilaterally. No superior mediastinal lymphadenopathy. Other neck: Negative.  No neck mass or lymphadenopathy. Aortic arch: Moderate Calcified aortic atherosclerosis. 3 vessel arch configuration. Right carotid system: No brachiocephalic or right CCA origin stenosis despite some soft plaque. Mild proximal right CCA tortuosity. Mild calcified and soft plaque at the right carotid bifurcation with no cervical right ICA stenosis. Left carotid system: No left CCA stenosis despite some plaque. Tortuous proximal left CCA. Minimal plaque at the left carotid bifurcation. Mild soft and calcified plaque in the distal left ICA bulb. No cervical left ICA stenosis. Vertebral arteries: No proximal right subclavian artery stenosis despite mild plaque. Normal right vertebral artery origin. Tortuous right V1 segment. Patent  right vertebral artery to the skull base without stenosis. No proximal left subclavian artery stenosis despite soft and calcified plaque. The left vertebral artery origin is normal (series 9 image 141). Mild to moderate tortuosity of the left V1 segment. Tortuous proximal left V2 segment. The left vertebral artery is patent to the skull base without stenosis. CTA HEAD Posterior circulation: The left vertebral V4 segment is patent but demonstrates areas of mild to moderate irregularity suspicious for atherosclerosis. No hemodynamically significant distal left vertebral stenosis results. The left PICA origin appears partially thrombosed on series 11, image 147, but there is some preserved PICA enhancement. There is mild to moderate irregularity also in the right vertebral artery V4 segment compatible with atherosclerosis, no significant stenosis. Patent right PICA origin. Patent vertebrobasilar junction. Patent basilar artery with mild irregularity, no stenosis. Normal SCA and PCA origins. Posterior communicating arteries are diminutive or absent. Mild bilateral PCA irregularity compatible with atherosclerosis. Preserved distal PCA enhancement. Anterior circulation: Patent left ICA siphon with mild to moderate calcified plaque beginning in the cavernous segment. There is only mild distal left cavernous and proximal supraclinoid left ICA stenosis. Normal left ophthalmic artery origin. The right ICA siphon is patent with similar moderate calcified plaque. There is mild right cavernous but moderate proximal right supraclinoid ICA stenosis (series 11, image 88 and series 13 image 66). Normal right ophthalmic artery origin. The bilateral carotid termini are patent. MCA and ACA origins are patent with mild irregularity. Mild ACA A1 segment irregularity. Anterior communicating artery is normal. There is moderate distal left A2 segment irregularity and stenosis (series 16, image 21). Other ACA branches are normal to mildly  irregular. The left MCA M1 segment is mildly irregular. The left MCA bifurcation is patent. There is mild anterior M2 and moderate to severe anterior M3 segment irregularity with stenoses (series 16, image 28). Mild left MCA branch irregularity elsewhere. Right MCA M1 segment  is mildly irregular and bifurcates early. There is up to moderate stenosis at the right MCA bifurcation best seen on series 16, image 15. Right MCA branches are mildly irregular. Venous sinuses: Patent. Anatomic variants: None. Delayed phase: No abnormal enhancement identified. The boundary of cytotoxic edema in the inferior left cerebellum is more apparent following contrast. Review of the MIP images confirms the above findings IMPRESSION: 1. Negative for large vessel occlusion. 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 2. There is little extracranial atherosclerosis, with no cervical carotid or vertebral artery stenosis. But there is moderate generalized intracranial atherosclerosis. Mostly mild intracranial arterial stenoses are noted - including in both vertebral artery V4 segments - with the following exceptions: - moderate right ICA siphon supraclinoid stenosis due to calcified plaque. - moderate right MCA bifurcation stenosis. - moderate left ACA A2 segment stenosis. - moderate and severe left MCA anterior division M3 stenoses. 3. Stable appearance of the acute left PICA infarct from the MRI earlier today. Cytotoxic edema with no hemorrhage or mass effect at this time. 4. Aortic Atherosclerosis (ICD10-I70.0). Electronically Signed   By: Genevie Ann M.D.   On: 01/21/2018 07:56   Mr Brain Wo Contrast  Result Date: 01/21/2018 CLINICAL DATA:  Persistent central vertigo.  Near syncope. EXAM: MRI HEAD WITHOUT CONTRAST TECHNIQUE: Multiplanar, multiecho pulse sequences of the brain and surrounding structures were obtained without intravenous contrast. COMPARISON:  None. FINDINGS: BRAIN: The midline structures are normal.  Abnormal diffusion restriction within the inferior left cerebellar hemisphere, in the distribution of the posterior inferior cerebellar artery. No mass effect or acute hemorrhage. Fourth ventricle remains widely patent. Old left basal ganglia and corona radiata lacunar infarcts. Mild periventricular white matter hyperintensity. No age-advanced or lobar predominant atrophy. There is blooming on susceptibility weighted imaging in the area of the left posterior inferior cerebellar artery, which may indicate thrombus. VASCULAR: Major intracranial arterial and venous sinus flow voids are preserved. SKULL AND UPPER CERVICAL SPINE: The visualized skull base, calvarium, upper cervical spine and extracranial soft tissues are normal. SINUSES/ORBITS: Small maxillary retention cysts. No mastoid or middle ear effusion. Normal orbits. IMPRESSION: 1. Acute left posterior inferior cerebellar artery territory infarct without hemorrhage or mass effect 2. Chronic small vessel disease with old left basal ganglia and corona radiata lacunar infarcts. Electronically Signed   By: Ulyses Jarred M.D.   On: 01/21/2018 05:32   Dg Chest Portable 1 View  Result Date: 01/21/2018 CLINICAL DATA:  Dyspnea and hypoxia. EXAM: PORTABLE CHEST 1 VIEW COMPARISON:  12/07/2017 FINDINGS: Cardiomegaly with aortic atherosclerosis. Mild central vascular congestion superimposed on chronic interstitial lung disease. No effusion or pulmonary consolidation. No pneumothorax. Degenerative changes are seen about both shoulders and dorsal spine. IMPRESSION: Cardiomegaly with aortic atherosclerosis.  Mild CHF. Electronically Signed   By: Ashley Royalty M.D.   On: 01/21/2018 01:48    Assessment and Plan:   1. Chronic systolic heart failure, Ischemic cardiomyopathy - echo this admission with lower EF of 30-35%, previously 35-40% - pt is not volume overloaded on exam - restart home lasix regimen of 20 mg daily - continue coreg, losartan, lipitor - will likely  repeat echo in 3-6 months - will monitor volume status   2. CAD s/p DES to proximal-mid LAD, DES to OM1 01/23/16 - now back on ASA and plavix for stroke - stable, no chest pain - continue meds as above   3. Embolic stroke - TTE could not rule out apical thrombus - Recommend TEE - will  also consider implantable loop recorder to rule out atrial arhythmias that could contribute to stroke   4. Hx of PVCx, bigeminy - pt is asymptomatic currently - continue coreg for now   5. HTN - pressures have been controlled   For questions or updates, please contact [place] Please consult [email or web address] for contact info under Cardiology/STEMI.   Signed, Tami Lin Duke, PA  01/24/2018 10:29 AM  History and all data above reviewed.  Patient examined.  I agree with the findings as above.  The patient presents with an acute dizziness.   He is found to have an acute CVA presumed embolic.  He has a history of CAD with ischemic cardiomyopathy but he has been very asymptomatic over the years.  He is quite active.  He has 9 children and 30 grand children and a handful of great grandchildren and is a retired Pharmacist, community.  He has had no recent acute cardiovascular symptoms.  The patient denies any new symptoms such as chest discomfort, neck or arm discomfort. There has been no new shortness of breath, PND or orthopnea. There have been no reported presyncope or syncope.  He does have ventricular ectopy and has felt this chronically but not recently and not sustained.  He is still very dizzy when he stands.  He is not having any other neurovascular symptoms.  Echo showed the reduced EF and could not exclude apical thrombus. I reviewed the images.  No contrast was used.  The patient exam reveals COR:RRR  ,  Lungs: Clear  ,  Abd: Positive bowel sounds, no rebound no guarding, Ext No edema  .  All available labs, radiology testing, previous records reviewed. Agree with documented assessment and plan. Embolic CVA:   Repeat limited echo with contrast to adequately see the apex.  If clot then DOAC.  If not then TEE to look for LA source.  If negative he will need an implanted loop.  Discussed with the patient, his son and the neurology team.     Patrick Maxwell  12:48 PM  01/24/2018

## 2018-01-25 ENCOUNTER — Inpatient Hospital Stay (HOSPITAL_COMMUNITY): Payer: PPO

## 2018-01-25 DIAGNOSIS — R001 Bradycardia, unspecified: Secondary | ICD-10-CM

## 2018-01-25 LAB — ECHOCARDIOGRAM LIMITED
Height: 71 in
Weight: 3072.33 oz

## 2018-01-25 MED ORDER — PERFLUTREN LIPID MICROSPHERE
1.0000 mL | INTRAVENOUS | Status: AC | PRN
  Administered 2018-01-25: 2 mL via INTRAVENOUS
  Filled 2018-01-25: qty 10

## 2018-01-25 NOTE — Consult Note (Signed)
            [facility] CM Primary Care Navigator  01/25/2018  Patrick Maxwell [birth year] [id number]   Seen patient and wife (Patrick Maxwell)at the bedsideto identify possible discharge needs. Patientreports thathe "had visual disturbance, tingling and weakness to left side" that resulted tothis admission.   Patient endorsesDr. Vic Blackbird with [place] theprimary care provider.    [facility] [place] [facility].  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  Patientverbalized thathe has been driving prior to admission. Wife will be able to provide transportationto his doctors'appointments after discharge.  Patient states that his wife will be the primary caregiver at home.  [facility].   Patientand wife voiced understanding to call primary care provider's office whenhe returns back home, for a post discharge follow-up within1- 2weeksor sooner if needs arise. Patient letter (with PCP's contact number) was provided asareminder.   Discussed with patientand wife regarding [facility] CM services available for health management at homebut denied any needs or concernsfor now.Patient and wife are well informed and knowledgeable of ways to manage HF and other health needs after discharge. Patient and wife expressed understandingto seek referral from primary care provider to [facility] care management if deemed necessary and appropriate for services in the future- once he returns back home.  [facility] care management information was provided for future needs thathe may have.  Noted order for EMMI stroke calls already in place.    For additional questions please contact:  Edwena Felty A. Rip Hawes, BSN, RN-BC [facility] PRIMARY CARE Navigator Cell: [phone]

## 2018-01-25 NOTE — Progress Notes (Signed)
Physical Therapy Treatment Patient Details Name: Patrick Maxwell MRN: [id number] DOB: [birth year] Today's Date: 01/25/2018    History of Present Illness [name] is a 84 y.o. male with onset of dysarthria and dizziness due to left posterior inferior cerebellar artery infarctstroke, HTN, PVCs, CHF, and CAD.     PT Comments    Patient is making progress toward mobility goals. Pt required mod +2 for gait progression with RW and chair follow. Pt continues to demonstrate L side weakness and L lateral lean. Continue to recommend CIR for further skilled PT services to maximize independence and safety with mobility.    Follow Up Recommendations  [facility]     Equipment Recommendations  3in1 (PT)    Recommendations for Other Services       Precautions / Restrictions Precautions Precautions: Fall Precaution Comments: left sided ataxia and left lateral lean    Mobility  Bed Mobility Overal bed mobility: Needs Assistance Bed Mobility: Supine to Sit     Supine to sit: Supervision     General bed mobility comments: supervision for safety  Transfers Overall transfer level: Needs assistance Equipment used: 1 person hand held assist Transfers: Sit to/from Stand Sit to Stand: Mod assist         General transfer comment: assist to power up into standing and to gain balance upon standing  Ambulation/Gait Ambulation/Gait assistance: Mod assist;+2 safety/equipment(chair follow) Ambulation Distance (Feet): (hallway ambulation) Assistive device: Rolling walker (2 wheeled) Gait Pattern/deviations: Step-through pattern;Ataxic;Drifts right/left;Scissoring;Narrow base of support     General Gait Details: mod A required with use of RW due to L lateral lean, frequent changes to gait velocity with increasing ataxia noted, and scissoring with turning and directional changes; multimodal cues for midline and vc for safe use of AD and gait velocity   Stairs              Wheelchair Mobility    Modified Rankin (Stroke Patients Only)       Balance Overall balance assessment: Needs assistance   Sitting balance-Leahy Scale: Fair       Standing balance-Leahy Scale: Poor                              Cognition Arousal/Alertness: Awake/alert Behavior During Therapy: WFL for tasks assessed/performed Overall Cognitive Status: Within Functional Limits for tasks assessed                                        Exercises      General Comments        Pertinent Vitals/Pain Pain Assessment: No/denies pain    Home Living                      Prior Function            PT Goals (current goals can now be found in the care plan section) Acute Rehab PT Goals Patient Stated Goal: to get back to normal Progress towards PT goals: Progressing toward goals    Frequency    Min 4X/week      PT Plan Current plan remains appropriate    Co-evaluation              AM-PAC PT "6 Clicks" Daily Activity  Outcome Measure  Difficulty turning over in bed (including adjusting bedclothes, sheets and blankets)?:  None Difficulty moving from lying on back to sitting on the side of the bed? : None Difficulty sitting down on and standing up from a chair with arms (e.g., wheelchair, bedside commode, etc,.)?: Unable Help needed moving to and from a bed to chair (including a wheelchair)?: A Lot Help needed walking in hospital room?: A Lot Help needed climbing 3-5 steps with a railing? : A Lot 6 Click Score: 15    End of Session Equipment Utilized During Treatment: Gait belt Activity Tolerance: Patient tolerated treatment well Patient left: in chair;with call bell/phone within reach Nurse Communication: Mobility status PT Visit Diagnosis: Ataxic gait (R26.0);Difficulty in walking, not elsewhere classified (R26.2);Other symptoms and signs involving the nervous system (R29.898)     Time: 1527-1550 PT Time  Calculation (min) (ACUTE ONLY): 23 min  Charges:  $Gait Training: 8-22 mins $Therapeutic Activity: 8-22 mins                    G Codes:       Earney Navy, PTA Pager: [phone]     Darliss Cheney 01/25/2018, 4:49 PM

## 2018-01-25 NOTE — Progress Notes (Addendum)
Progress Note  Patient Name: Patrick Maxwell Date of Encounter: 01/25/2018  Primary Cardiologist: Dr. Angelena Form  Subjective   Pt feeling well today. No chest pain or palpitations. Denies SOB. Awaiting insurance to clear for intensive inpatient rehabilitation. Limited echo to evaluate for apical clot pending   Inpatient Medications    Scheduled Meds: . aspirin EC  81 mg Oral Daily  . atorvastatin  40 mg Oral q1800  . carvedilol  3.125 mg Oral BID  . cholecalciferol  5,000 Units Oral Daily  . clopidogrel  75 mg Oral Daily  . heparin  5,000 Units Subcutaneous Q8H  . polyethylene glycol  17 g Oral Daily  . senna-docusate  2 tablet Oral Once  . tamsulosin  0.4 mg Oral Daily   Continuous Infusions:  PRN Meds: acetaminophen **OR** acetaminophen (TYLENOL) oral liquid 160 mg/5 mL **OR** acetaminophen, hydrALAZINE, meclizine, perflutren lipid microspheres (DEFINITY) IV suspension, senna-docusate   Vital Signs    Vitals:   01/24/18 1944 01/24/18 2331 01/25/18 0355 01/25/18 0851  BP: 126/72 134/81 133/72 112/62  Pulse: (!) 43 89 (!) 43 (!) 40  Resp: 18 18 16 14   Temp: 98.2 F (36.8 C) 98.1 F (36.7 C) 98.3 F (36.8 C) (!) 97.5 F (36.4 C)  TempSrc:  Oral Oral Oral  SpO2: 94% 98% 97% 97%  Weight:   192 lb 0.3 oz (87.1 kg)   Height:        Intake/Output Summary (Last 24 hours) at 01/25/2018 1124 Last data filed at 01/25/2018 0358 Gross per 24 hour  Intake -  Output 300 ml  Net -300 ml   Filed Weights   01/21/18 1737 01/24/18 0100 01/25/18 0355  Weight: 190 lb 7.6 oz (86.4 kg) 194 lb 3.6 oz (88.1 kg) 192 lb 0.3 oz (87.1 kg)    Physical Exam   General: Elderly, appears younger than stated age, NAD Skin: Warm, dry, intact  Head: Normocephalic, atraumatic, clear, moist mucus membranes. Neck: Negative for carotid bruits. No JVD Lungs:Clear to ausculation bilaterally. No wheezes, rales, or rhonchi. Breathing is unlabored. Cardiovascular: RRR with S1 S2. No murmurs,  rubs or gallops Abdomen: Soft, non-tender, non-distended with normoactive bowel sounds. No obvious abdominal masses. MSK: Strength and tone appear decreased for age. 4/4 upper extremities  Extremities: No edema. No clubbing or cyanosis. DP/PT pulses 1+ bilaterally Neuro: Alert and oriented. No focal deficits. No facial asymmetry. MAE spontaneously. Psych: Responds to questions appropriately with normal affect.    Labs    Chemistry Recent Labs  Lab 01/20/18 2324 01/24/18 0300  NA 139 139  K 4.1 4.1  CL 108 100*  CO2 21* 30  GLUCOSE 215* 107*  BUN 22* 14  CREATININE 1.29* 1.34*  CALCIUM 8.9 9.0  GFRNONAA 49* 47*  GFRAA 57* 54*  ANIONGAP 10 9     Hematology Recent Labs  Lab 01/20/18 2324 01/24/18 0300  WBC 11.6* 6.9  RBC 5.00 5.02  HGB 14.5 14.3  HCT 43.4 43.3  MCV 86.8 86.3  MCH 29.0 28.5  MCHC 33.4 33.0  RDW 15.8* 15.5  PLT 139* 129*    Cardiac EnzymesNo results for input(s): TROPONINI in the last 168 hours.  Recent Labs  Lab 01/21/18 0139  TROPIPOC 0.01     BNP Recent Labs  Lab 01/21/18 0137  BNP 2,431.3*     DDimer No results for input(s): DDIMER in the last 168 hours.   Radiology    No results found.  Telemetry    01/25/18  Pt with periods of bradycardia in the 40's - Personally Reviewed  ECG    01/21/18 NSR HR 90- Personally Reviewed  Cardiac Studies   Echo 01/21/18: Study Conclusions - Left ventricle: The cavity size was moderately dilated. Wall thickness was normal. Systolic function was moderately to severely reduced. The estimated ejection fraction was in the range of 30% to 35%. Diffuse hypokinesis. Cannot exclude apical thrombus on non-contrast image. The study is not technically sufficient to allow evaluation of LV diastolic function. - Aortic valve: Trileaflet. Sclerosis without stenosis. There was trivial regurgitation. - Mitral valve: Mildly thickened leaflets . There was moderate regurgitation. - Left  atrium: The atrium was mildly dilated. - Right ventricle: The cavity size was mildly dilated. - Tricuspid valve: There was mild regurgitation. - Pulmonary arteries: PA peak pressure: 59 mm Hg (S). - Inferior vena cava: The vessel was dilated. The respirophasic diameter changes were blunted (<50%), consistent with elevated central venous pressure.  Impressions: - Compared to a prior study in 10/2017, the LVEF is lower at 30-35%. Cannot exclude apical thrombus - recommend limited Definity contrast study to evaluate.   Holter 11/25/17: Recs: PVCs - restarted coreg  Sinus rhythm Frequent Premature ventricular contractions, ventricular bigeminy with couplets and triplets Frequent Premature atrial contractions.    Right and left heart cath 01/23/16:  Mid LAD lesion, 99% stenosed.  Mid LAD to Dist LAD lesion, 20% stenosed.  Mid RCA to Dist RCA lesion, 20% stenosed.  RPDA lesion, 20% stenosed.  Ost LAD to Prox LAD lesion, 50% stenosed. Post intervention, there is a 0% residual stenosis.  2nd Mrg lesion, 80% stenosed. Post intervention, there is a 0% residual stenosis.  1. Severe double vessel CAD 2. Unstable angina.  3. Ischemic cardiomyopathy 4. Severe stenosis mid LAD. Now s/p successful PTCA/DES x 1 proximal and mid LAD 5. Severe stenosis mid obtuse marginal. Now s/p successful PTCA/DES x 1 OM1.   Recommendations: He will need DAPT with ASA and Plavix for at least one month. Will start statin. Continue beta blocker.   Patient Profile     84 y.o. male  with a hx of CAD s/p DES to LAD/OM! 01/2016, ischemic cardiomyopathy, chronic systolic heart failure, HTN, HLD, CVA, and PVCs by holter on BB  who is being seen today for the evaluation of palpitations at the request of Dr. Cruzita Lederer.  Assessment & Plan    1. Chronic systolic heart failure with ischemic cardiomyopathy: -Per echocardiogram 01/21/18 with LVEF of 30-35%, previously 35-40%>>>awating results from  limited study to further evaluate apical region for clot. If there is a clot present>>would need DOAC. If not, then TEE to look for LA source>>>>if this remains negative, would consider implantable loop recorder  -Does not appear volume overloaded on exam  -Lasix PO 20mg  QD -Carvedilol, losartan, statin -Will need repeat echo in 3 months -Weight, 192lb >>>190lb on admission  -I&O, net negative 1.7L since admission, total 24H output 324ml    2. CAD s/p DES to proximal -mid LAD, DES to OM1 01/23/16: -Denies chest pain -On ASA, Plavix for stroke   3. Embolic stroke: -TTE could not rule out apical thrombus>>>see plan outlined above  -Possible consideration for implantable loop recorder to rule out atrial arrhthymias, stroke etiology    4. Hx of bradycardia and PVC, bigeminy: -Pt asymptomatic  -Per pt and family, he has remained bradycardic since DES implantation  -He stays in the 40's with and without BB on board  -Continue carvedilol for rate control   5.  HTN: -Stable well controlled on current regimen  -Carvedilol, losartan -112/62>133/72>134/81>126/72  6. Acute renal insufficiency: -Cr, 1.34 -Basleline appears to be in the 1.1 range -Avoid nephrotoxic medicaitons  Signed, Kathyrn Drown NP-C [facility] Pager: [phone] 01/25/2018, 11:24 AM     For questions or updates, please contact   Please consult [email or web address] for contact info under Cardiology/STEMI.  Attending Note:   The patient was seen and examined.  Agree with assessment and plan as noted above.  Changes made to the above note as needed.  Patient seen and independently examined with  Kathyrn Drown, NP .   We discussed all aspects of the encounter. I agree with the assessment and plan as stated above.  1.  CAD s/p  Stenting in 2017. No angina 2.  Embolic CVA :  Repeat echo pending 3.  HTN:   BP is well controlled.    I have spent a total of 40 minutes with patient reviewing hospital  notes , telemetry, EKGs,  labs and examining patient as well as establishing an assessment and plan that was discussed with the patient. > 50% of time was spent in direct patient care.    Thayer Headings, Brooke Bonito., MD, [facility] 01/25/2018, 12:35 PM [address]  [place] Pager [phone]

## 2018-01-25 NOTE — Progress Notes (Signed)
  Echocardiogram 2D Echocardiogram limited with definity has been performed.  Jennette Dubin 01/25/2018, 10:02 AM

## 2018-01-25 NOTE — Progress Notes (Signed)
I met with patient at bedside and his wife in the waiting room. We discussed goals and expectations of an inpt rehab admit. They prefer CIR rather than SNF. I will begin insurance authorization for a possible inpt rehab admit pending medical work up completion and insurance approval.[phone]

## 2018-01-25 NOTE — Progress Notes (Signed)
STROKE TEAM - DAILY PROGRESS NOTE    SUBJECTIVE: Pt wife at bedside. Pt awake alert and lying in bed comfortably. No acute event overnight. Plan to have TTE with contrast and plan TEE tomorrow based on TEE result today. PT/OT therapy and pending CIR  OBJECTIVE Most recent Vital Signs: Vitals:   01/24/18 1944 01/24/18 2331 01/25/18 0355 01/25/18 0851  BP: 126/72 134/81 133/72 112/62  Pulse: (!) 43 89 (!) 43 (!) 40  Resp: 18 18 16 14   Temp: 98.2 F (36.8 C) 98.1 F (36.7 C) 98.3 F (36.8 C) (!) 97.5 F (36.4 C)  TempSrc:  Oral Oral Oral  SpO2: 94% 98% 97% 97%  Weight:   192 lb 0.3 oz (87.1 kg)   Height:       CBG (last 3)  No results for input(s): GLUCAP in the last 72 hours.  Physical Exam  HEENT-  Normocephalic, no lesions, without obvious abnormality.  Normal external eye and conjunctiva.   Cardiovascular- Bradycardic rhythm, S1-S2 audible, pulses palpable throughout   Lungs-no rhonchi or wheezing noted, no excessive working breathing.  Abdomen- All 4 quadrants palpated and nontender Musculoskeletal-no joint tenderness Skin-warm and dry  Neuro:  Mental Status: Alert, oriented, thought content appropriate.  Dysarthria slightly detected, but as he goes on in conversation it improves and becomes clear.  Able to follow 3 step commands without difficulty. Cranial Nerves: II:  Visual fields grossly normal,  III,IV, VI: ptosis not present, extra-ocular motions intact bilaterally pupils equal, round, reactive to light  V,VII: smile symmetric, facial light touch sensation normal bilaterally VIII: hearing intact to voice IX,X: uvula rises symmetrically XI: bilateral shoulder shrug XII: midline tongue extension Motor: Right : Upper extremity   5/5    Left:     Upper extremity   5/5  Lower extremity   5/5     Lower extremity   5/5 Tone and bulk:normal tone throughout; no atrophy noted Sensory: admits to decreased sensation in  left fingers Deep Tendon Reflexes: 2+ and symmetric throughout Plantars: Right: downgoing   Left: downgoing Cerebellar: Pt with trunkal ataxia; normal finger-to-nose, slightly off with left heal to shin Gait:Defered   MEDICATIONS  . aspirin EC  81 mg Oral Daily  . atorvastatin  40 mg Oral q1800  . carvedilol  3.125 mg Oral BID  . cholecalciferol  5,000 Units Oral Daily  . clopidogrel  75 mg Oral Daily  . heparin  5,000 Units Subcutaneous Q8H  . polyethylene glycol  17 g Oral Daily  . senna-docusate  2 tablet Oral Once  . tamsulosin  0.4 mg Oral Daily   PRN:  acetaminophen **OR** acetaminophen (TYLENOL) oral liquid 160 mg/5 mL **OR** acetaminophen, hydrALAZINE, meclizine, perflutren lipid microspheres (DEFINITY) IV suspension, senna-docusate  Diet:   Diet Order           Diet Heart Room service appropriate? Yes; Fluid consistency: Thin  Diet effective now          CLINICALLY SIGNIFICANT STUDIES Basic Metabolic Panel:  Recent Labs  Lab 01/20/18 2324 01/21/18 0030 01/24/18 0300  NA 139  --  139  K 4.1  --  4.1  CL 108  --  100*  CO2  21*  --  30  GLUCOSE 215*  --  107*  BUN 22*  --  14  CREATININE 1.29*  --  1.34*  CALCIUM 8.9  --  9.0  MG  --  1.9  --    CBC:  Recent Labs  Lab 01/20/18 2324 01/24/18 0300  WBC 11.6* 6.9  HGB 14.5 14.3  HCT 43.4 43.3  MCV 86.8 86.3  PLT 139* 129*   CoaUrinalysis:  Recent Labs  Lab 01/20/18 2311  COLORURINE YELLOW  LABSPEC 1.019  PHURINE 5.0  GLUCOSEU NEGATIVE  HGBUR SMALL*  BILIRUBINUR NEGATIVE  KETONESUR NEGATIVE  PROTEINUR 30*  NITRITE NEGATIVE  LEUKOCYTESUR NEGATIVE   Lipid Panel    Component Value Date/Time   CHOL 151 01/22/2018 0720   TRIG 64 01/22/2018 0720   HDL 41 01/22/2018 0720   CHOLHDL 3.7 01/22/2018 0720   VLDL 13 01/22/2018 0720   LDLCALC 97 01/22/2018 0720   LDLCALC 114 (H) 11/09/2017 0841   HgbA1C  Lab Results  Component Value Date   HGBA1C 5.7 (H) 01/22/2018    I have personally  reviewed the radiological images below and agree with the radiology interpretations.  CT Angio Head/Neck W Or Wo Contrast   01/21/2018 IMPRESSION:  1. Negative for large vessel occlusion. 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  2. There is little extracranial atherosclerosis, with no cervical carotid or vertebral artery stenosis. But there is moderate generalized intracranial atherosclerosis. Mostly mild intracranial arterial stenoses are noted - including in both vertebral artery V4 segments - with the following exceptions: - moderate right ICA siphon supraclinoid stenosis due to calcified plaque. - moderate right MCA bifurcation stenosis. - moderate left ACA A2 segment stenosis. - moderate and severe left MCA anterior division M3 stenoses.  3. Stable appearance of the acute left PICA infarct from the MRI earlier today. Cytotoxic edema with no hemorrhage or mass effect at this time.  4. Aortic Atherosclerosis (ICD10-I70.0).   MR Brain Wo Contrast  01/21/2018 IMPRESSION: 1. Acute left posterior inferior cerebellar artery territory infarct without hemorrhage or mass effect  2. Chronic small vessel disease with old left basal ganglia and corona radiata lacunar infarcts.   Dg Chest Portable 1 View: 01/21/2018 IMPRESSION:  Cardiomegaly with aortic atherosclerosis.  Mild CHF.  2D Echocardiogram 01/21/18 Compared to a prior study in 10/2017, the LVEF is lower at 30-35%.   Cannot exclude apical thrombus - recommend limited Definity   contrast study to evaluate.  EKG: SR with bigeminy.   Outstanding Stroke Work-up Studies:     TEE /Loop Recorder:                                             PENDING  Therapy Recommendations: CIR  VTE Prophylaxis: Heparin SQ  ASSESSMENT 84 year old male with history of CAD, CHF, HTN, HLD, PVCs, and stroke admitted for vertigo, gait difficulty, and ataxia.   He had a stroke about 10 years ago with word finding difficulties.  He was in [place]  at the time and admitted to the hospital and was told to have a small left temporal stroke.  His speech resolved in 6 months.  This time he was admitted for vertigo, not able to walk, with ataxia.  MRI showed left PICA infarct, and old left BG/CR infarct.  CTA head and neck left PICA occlusion with diffuse intracranial  stenosis including right cavernous SCA and right M1.  EF 30 to 35%, down from 35 to 40% in 10/2017.  He also had a 48-hour Holter monitoring PTA showed frequent PVCs which he was put on Coreg.  A1c 5.7 and LDL 87 this time.  His stroke could be due to PICA occlusion due to atherosclerosis or cardioembolic given low EF. Had cardiology consultation, will repeat 2D echo with contrast today, if no significant finding, likely need TEE to rule out LV or LA thrombus and consider loop recorder to rule out afib. Continue dual antiplatelet and Lipitor for stroke prevention. PT/OT follow up and pending CIR.    Rosalin Hawking, MD PhD Stroke Neurology 01/25/2018 11:40 AM      To contact Stroke Continuity provider, please refer to [email or web address] After hours, contact General Neurology

## 2018-01-25 NOTE — H&P (View-Only) (Signed)
Progress Note  Patient Name: Patrick Maxwell Date of Encounter: 01/25/2018  Primary Cardiologist: Dr. Angelena Form  Subjective   Pt feeling well today. No chest pain or palpitations. Denies SOB. Awaiting insurance to clear for intensive inpatient rehabilitation. Limited echo to evaluate for apical clot pending   Inpatient Medications    Scheduled Meds: . aspirin EC  81 mg Oral Daily  . atorvastatin  40 mg Oral q1800  . carvedilol  3.125 mg Oral BID  . cholecalciferol  5,000 Units Oral Daily  . clopidogrel  75 mg Oral Daily  . heparin  5,000 Units Subcutaneous Q8H  . polyethylene glycol  17 g Oral Daily  . senna-docusate  2 tablet Oral Once  . tamsulosin  0.4 mg Oral Daily   Continuous Infusions:  PRN Meds: acetaminophen **OR** acetaminophen (TYLENOL) oral liquid 160 mg/5 mL **OR** acetaminophen, hydrALAZINE, meclizine, perflutren lipid microspheres (DEFINITY) IV suspension, senna-docusate   Vital Signs    Vitals:   01/24/18 1944 01/24/18 2331 01/25/18 0355 01/25/18 0851  BP: 126/72 134/81 133/72 112/62  Pulse: (!) 43 89 (!) 43 (!) 40  Resp: 18 18 16 14   Temp: 98.2 F (36.8 C) 98.1 F (36.7 C) 98.3 F (36.8 C) (!) 97.5 F (36.4 C)  TempSrc:  Oral Oral Oral  SpO2: 94% 98% 97% 97%  Weight:   192 lb 0.3 oz (87.1 kg)   Height:        Intake/Output Summary (Last 24 hours) at 01/25/2018 1124 Last data filed at 01/25/2018 0358 Gross per 24 hour  Intake -  Output 300 ml  Net -300 ml   Filed Weights   01/21/18 1737 01/24/18 0100 01/25/18 0355  Weight: 190 lb 7.6 oz (86.4 kg) 194 lb 3.6 oz (88.1 kg) 192 lb 0.3 oz (87.1 kg)    Physical Exam   General: Elderly, appears younger than stated age, NAD Skin: Warm, dry, intact  Head: Normocephalic, atraumatic, clear, moist mucus membranes. Neck: Negative for carotid bruits. No JVD Lungs:Clear to ausculation bilaterally. No wheezes, rales, or rhonchi. Breathing is unlabored. Cardiovascular: RRR with S1 S2. No murmurs,  rubs or gallops Abdomen: Soft, non-tender, non-distended with normoactive bowel sounds. No obvious abdominal masses. MSK: Strength and tone appear decreased for age. 4/4 upper extremities  Extremities: No edema. No clubbing or cyanosis. DP/PT pulses 1+ bilaterally Neuro: Alert and oriented. No focal deficits. No facial asymmetry. MAE spontaneously. Psych: Responds to questions appropriately with normal affect.    Labs    Chemistry Recent Labs  Lab 01/20/18 2324 01/24/18 0300  NA 139 139  K 4.1 4.1  CL 108 100*  CO2 21* 30  GLUCOSE 215* 107*  BUN 22* 14  CREATININE 1.29* 1.34*  CALCIUM 8.9 9.0  GFRNONAA 49* 47*  GFRAA 57* 54*  ANIONGAP 10 9     Hematology Recent Labs  Lab 01/20/18 2324 01/24/18 0300  WBC 11.6* 6.9  RBC 5.00 5.02  HGB 14.5 14.3  HCT 43.4 43.3  MCV 86.8 86.3  MCH 29.0 28.5  MCHC 33.4 33.0  RDW 15.8* 15.5  PLT 139* 129*    Cardiac EnzymesNo results for input(s): TROPONINI in the last 168 hours.  Recent Labs  Lab 01/21/18 0139  TROPIPOC 0.01     BNP Recent Labs  Lab 01/21/18 0137  BNP 2,431.3*     DDimer No results for input(s): DDIMER in the last 168 hours.   Radiology    No results found.  Telemetry    01/25/18  Pt with periods of bradycardia in the 40's - Personally Reviewed  ECG    01/21/18 NSR HR 90- Personally Reviewed  Cardiac Studies   Echo 01/21/18: Study Conclusions - Left ventricle: The cavity size was moderately dilated. Wall thickness was normal. Systolic function was moderately to severely reduced. The estimated ejection fraction was in the range of 30% to 35%. Diffuse hypokinesis. Cannot exclude apical thrombus on non-contrast image. The study is not technically sufficient to allow evaluation of LV diastolic function. - Aortic valve: Trileaflet. Sclerosis without stenosis. There was trivial regurgitation. - Mitral valve: Mildly thickened leaflets . There was moderate regurgitation. - Left  atrium: The atrium was mildly dilated. - Right ventricle: The cavity size was mildly dilated. - Tricuspid valve: There was mild regurgitation. - Pulmonary arteries: PA peak pressure: 59 mm Hg (S). - Inferior vena cava: The vessel was dilated. The respirophasic diameter changes were blunted (<50%), consistent with elevated central venous pressure.  Impressions: - Compared to a prior study in 10/2017, the LVEF is lower at 30-35%. Cannot exclude apical thrombus - recommend limited Definity contrast study to evaluate.   Holter 11/25/17: Recs: PVCs - restarted coreg  Sinus rhythm Frequent Premature ventricular contractions, ventricular bigeminy with couplets and triplets Frequent Premature atrial contractions.    Right and left heart cath 01/23/16:  Mid LAD lesion, 99% stenosed.  Mid LAD to Dist LAD lesion, 20% stenosed.  Mid RCA to Dist RCA lesion, 20% stenosed.  RPDA lesion, 20% stenosed.  Ost LAD to Prox LAD lesion, 50% stenosed. Post intervention, there is a 0% residual stenosis.  2nd Mrg lesion, 80% stenosed. Post intervention, there is a 0% residual stenosis.  1. Severe double vessel CAD 2. Unstable angina.  3. Ischemic cardiomyopathy 4. Severe stenosis mid LAD. Now s/p successful PTCA/DES x 1 proximal and mid LAD 5. Severe stenosis mid obtuse marginal. Now s/p successful PTCA/DES x 1 OM1.   Recommendations: He will need DAPT with ASA and Plavix for at least one month. Will start statin. Continue beta blocker.   Patient Profile     84 y.o. male  with a hx of CAD s/p DES to LAD/OM! 01/2016, ischemic cardiomyopathy, chronic systolic heart failure, HTN, HLD, CVA, and PVCs by holter on BB  who is being seen today for the evaluation of palpitations at the request of Dr. Cruzita Lederer.  Assessment & Plan    1. Chronic systolic heart failure with ischemic cardiomyopathy: -Per echocardiogram 01/21/18 with LVEF of 30-35%, previously 35-40%>>>awating results from  limited study to further evaluate apical region for clot. If there is a clot present>>would need DOAC. If not, then TEE to look for LA source>>>>if this remains negative, would consider implantable loop recorder  -Does not appear volume overloaded on exam  -Lasix PO 20mg  QD -Carvedilol, losartan, statin -Will need repeat echo in 3 months -Weight, 192lb >>>190lb on admission  -I&O, net negative 1.7L since admission, total 24H output 373ml    2. CAD s/p DES to proximal -mid LAD, DES to OM1 01/23/16: -Denies chest pain -On ASA, Plavix for stroke   3. Embolic stroke: -TTE could not rule out apical thrombus>>>see plan outlined above  -Possible consideration for implantable loop recorder to rule out atrial arrhthymias, stroke etiology    4. Hx of bradycardia and PVC, bigeminy: -Pt asymptomatic  -Per pt and family, he has remained bradycardic since DES implantation  -He stays in the 40's with and without BB on board  -Continue carvedilol for rate control   5.  HTN: -Stable well controlled on current regimen  -Carvedilol, losartan -112/62>133/72>134/81>126/72  6. Acute renal insufficiency: -Cr, 1.34 -Basleline appears to be in the 1.1 range -Avoid nephrotoxic medicaitons  Signed, Kathyrn Drown NP-C [facility] Pager: [phone] 01/25/2018, 11:24 AM     For questions or updates, please contact   Please consult [email or web address] for contact info under Cardiology/STEMI.  Attending Note:   The patient was seen and examined.  Agree with assessment and plan as noted above.  Changes made to the above note as needed.  Patient seen and independently examined with  Kathyrn Drown, NP .   We discussed all aspects of the encounter. I agree with the assessment and plan as stated above.  1.  CAD s/p  Stenting in 2017. No angina 2.  Embolic CVA :  Repeat echo pending 3.  HTN:   BP is well controlled.    I have spent a total of 40 minutes with patient reviewing hospital  notes , telemetry, EKGs,  labs and examining patient as well as establishing an assessment and plan that was discussed with the patient. > 50% of time was spent in direct patient care.    Thayer Headings, Brooke Bonito., MD, [facility] 01/25/2018, 12:35 PM [address] [place] [address],  [place] Pager [phone]

## 2018-01-25 NOTE — H&P (Signed)
Physical Medicine and Rehabilitation Admission H&P    Chief Complaint  Patient presents with  . Near Syncope  : HPI: Patrick Maxwell is an 84 year old right-handed male with history of CVA 2009 maintained on aspirin, hypertension, chronic systolic congestive heart failure, CKD stage II, hyperlipidemia, CAD, bradycardia with PVCs.  Per chart review patient lives with spouse.  Independent prior to admission.  One level home with 4 steps to entry.  Presented 01/21/2018 with vertigo and tingling on his left side.  He was noted to be ataxic.  He presented to the ED.  MRI showed left cerebellar infarction.  Per report, acute left posterior inferior cerebellar artery infarction with chronic small vessel disease as well as old left basal ganglia and corona radiata infarcts.  CT angiogram negative for large vessel occlusion, left PICA partial thrombosis.  Echocardiogram with ejection fraction of 30 to 35% with diffuse hypokinesis.  Neurology follow-up currently maintained on aspirin as well as the addition of Plavix for CVA prophylaxis x3 weeks then Plavix alone.  A repeat echocardiogram was completed 01/25/2018 to rule out apical thrombus that was negative/no cardiac source of emboli identified.  There was noted diffuse hypokinesis ejection fraction was 25%.  Subcutaneous heparin for DVT prophylaxis.  TEE completed 01/26/2018 showing no thrombus, ejection fraction 20 to 25%.  Severe global hypokinesis.  No plan for loop recorder placement .  Tolerating a regular diet.  Physical and occupational therapy evaluations completed with recommendations of physical medicine rehab consult.  Patient was admitted for a comprehensive rehab program.  Review of Systems  Constitutional: Negative for chills and fever.  HENT: Negative for hearing loss.   Eyes: Negative for blurred vision and double vision.  Respiratory: Negative for cough and shortness of breath.   Cardiovascular: Positive for palpitations and leg swelling.    Gastrointestinal: Positive for constipation. Negative for nausea and vomiting.  Genitourinary: Positive for urgency. Negative for dysuria, flank pain and hematuria.  Musculoskeletal: Positive for myalgias.  Skin: Negative for rash.  Neurological: Positive for tingling and weakness.  All other systems reviewed and are negative.  Past Medical History:  Diagnosis Date  . CAD (coronary artery disease)   . CHF (congestive heart failure) ([place])   . Frequent unifocal PVCs    Palpitations since early 20s, PVCs and pairs  . Hyperlipidemia   . Hypertension   . Stroke [facility]) 2009   Past Surgical History:  Procedure Laterality Date  . CARDIAC CATHETERIZATION N/A 01/23/2016   Procedure: Right/Left Heart Cath and Coronary Angiography;  Surgeon: Burnell Blanks, MD;  Location: [facility];  Service: Cardiovascular;  Laterality: N/A;  . CARDIAC CATHETERIZATION N/A 01/23/2016   Procedure: Coronary Stent Intervention;  Surgeon: Burnell Blanks, MD;  Location: [facility];  Service: Cardiovascular;  Laterality: N/A;  . HERNIA REPAIR  06/1992   Family History  Problem Relation Age of Onset  . Stroke Mother   . Early death Mother   . Cancer Brother    Social History:  reports that he has never smoked. He has never used smokeless tobacco. He reports that he does not drink alcohol or use drugs. Allergies: No Known Allergies Medications Prior to Admission  Medication Sig Dispense Refill  . Ascorbic Acid (VITAMIN C PO) Take 250 mg by mouth 3 (three) times a week.     Marland Kitchen aspirin 81 MG tablet Take 81 mg by mouth daily.     . carvedilol (COREG) 3.125 MG tablet Take 1 tablet (  3.125 mg total) by mouth 2 (two) times daily. 180 tablet 3  . Cholecalciferol (VITAMIN D PO) Take 5,000 Units by mouth daily.    . Coenzyme Q10 (COQ10) 100 MG CAPS Take 1 capsule by mouth daily.    . furosemide (LASIX) 20 MG tablet TAKE ONE TABLET BY MOUTH ONCE DAILY 90 tablet 1  . losartan (COZAAR) 25 MG  tablet Take 0.5 tablets (12.5 mg total) by mouth daily. 90 tablet 3  . Magnesium 100 MG TABS Take 1 tablet by mouth 2 (two) times daily.    . Multiple Vitamin (MULTIVITAMIN WITH MINERALS) TABS tablet Take 1 tablet by mouth daily.    . nitroGLYCERIN (NITROSTAT) 0.4 MG SL tablet Place 1 tablet (0.4 mg total) under the tongue every 5 (five) minutes as needed. 25 tablet 3  . Potassium 99 MG TABS Take 99 mg by mouth daily.     . tamsulosin (FLOMAX) 0.4 MG CAPS capsule Take 1 capsule (0.4 mg total) by mouth daily. 30 capsule 3  . vitamin E 400 UNIT capsule Take 400 Units by mouth 3 (three) times a week.    Marland Kitchen atorvastatin (LIPITOR) 20 MG tablet Take 1 tablet (20 mg total) by mouth daily. (Patient not taking: Reported on 01/21/2018) 90 tablet 3  . benzonatate (TESSALON) 100 MG capsule Take 1 capsule (100 mg total) by mouth 3 (three) times daily as needed for cough. (Patient not taking: Reported on 01/21/2018) 20 capsule 0    Drug Regimen Review Drug regimen was reviewed and remains appropriate with no significant issues identified  Home: Home Living Family/patient expects to be discharged to:: Private residence Living Arrangements: Spouse/significant other Available Help at Discharge: Family Type of Home: House Home Access: Stairs to enter Technical brewer of Steps: 4 Entrance Stairs-Rails: Right Home Layout: One level Bathroom Toilet: Handicapped height Home Equipment: None   Functional History: Prior Function Level of Independence: Independent  Functional Status:  Mobility: Bed Mobility Overal bed mobility: Needs Assistance Bed Mobility: Supine to Sit, Sit to Supine Supine to sit: Supervision Sit to supine: Supervision General bed mobility comments: Given visual target during mobility Transfers Overall transfer level: Needs assistance Equipment used: 1 person hand held assist Transfers: Sit to/from Stand, Stand Pivot Transfers Sit to Stand: Mod assist(L bias) Stand pivot  transfers: Mod assist General transfer comment: Mod assist to prevent anterior and left LOB when coming to stand, the thrusted forward and needed cues to find midline.  Ambulation/Gait Ambulation/Gait assistance: Mod assist, +2 physical assistance Ambulation Distance (Feet): 20 Feet Assistive device: 2 person hand held assist Gait Pattern/deviations: Step-through pattern, Ataxic, Staggering left, Scissoring General Gait Details: Pt with very narrow BOS, near scissoring, left anterior lateral lean.  Cues to wide base during gait.  Some awareness of where he is leaning, just difficult to correct right now, dizziness with gait, nausea bag brought with [place].   Gait velocity interpretation: <1.8 ft/sec, indicate of risk for recurrent falls    ADL: ADL Overall ADL's : Needs assistance/impaired Eating/Feeding: Sitting, Minimal assistance Grooming: Standing, Minimal assistance Upper Body Bathing: Set up, Supervision/ safety, Sitting Lower Body Bathing: Moderate assistance, Sit to/from stand Upper Body Dressing : Minimal assistance, Sitting Lower Body Dressing: Moderate assistance, Sit to/from stand Toilet Transfer: Moderate assistance, Ambulation Toilet Transfer Details (indicate cue type and reason): ambulated to sink with therapist waslking on pt's left side with pt's L arm over shoulder to facilitaet step through pattern adn weight shift Toileting- Clothing Manipulation and Hygiene: Moderate assistance Toileting -  Clothing Manipulation Details (indicate cue type and reason): sit to stand noted.  ataxia noted with sit to stand transition as well as standing Functional mobility during ADLs: Moderate assistance General ADL Comments: Facilitaed control of movement patterns during ADL tasks. Pt with good ability to use feedback to modify movement.  Cognition: Cognition Overall Cognitive Status: Within Functional Limits for tasks assessed Orientation Level: Oriented X4 Cognition Arousal/Alertness:  Awake/alert Behavior During Therapy: WFL for tasks assessed/performed Overall Cognitive Status: Within Functional Limits for tasks assessed  Physical Exam: Blood pressure 112/62, pulse (!) 40, temperature (!) 97.5 F (36.4 C), temperature source Oral, resp. rate 14, height _0  (1.803 m), weight 87.1 kg (192 lb 0.3 oz), SpO2 97 %. Physical Exam  Vitals reviewed. Constitutional: He is oriented to person, place, and time. He appears well-developed and well-nourished.  HENT:  Head: Normocephalic and atraumatic.  Eyes: Pupils are equal, round, and reactive to light. Conjunctivae are normal.  Neck: No thyromegaly present.  Cardiovascular: Normal rate and regular rhythm. Exam reveals no friction rub.  No murmur heard. Respiratory: Effort normal. No respiratory distress. He has no wheezes. He has no rales.  GI: Soft. Bowel sounds are normal. He exhibits no distension. There is no tenderness.  Musculoskeletal: He exhibits edema.  Neurological: He is alert and oriented to person, place, and time. He has normal reflexes.  Normal insight and awareness. Subtle limb ataxia LUE. Sensation 2/2 bilateral. Strength 4 to 4+/5 bilaterally prox to distal. Subtle left sided weakness.  Skin:  Skin is intact warm and dry  Psychiatric: He has a normal mood and affect. His behavior is normal.    Results for orders placed or performed during the hospital encounter of 01/20/18 (from the past 48 hour(s))  CBC     Status: Abnormal   Collection Time: 01/24/18  3:00 AM  Result Value Ref Range   WBC 6.9 4.0 - 10.5 K/uL   RBC 5.02 4.22 - 5.81 MIL/uL   Hemoglobin 14.3 13.0 - 17.0 g/dL   HCT 43.3 39.0 - 52.0 %   MCV 86.3 78.0 - 100.0 fL   MCH 28.5 26.0 - 34.0 pg   MCHC 33.0 30.0 - 36.0 g/dL   RDW 15.5 11.5 - 15.5 %   Platelets 129 (L) 150 - 400 K/uL    Comment: Performed at [facility] [address], [place], [place] [address]  Basic metabolic panel     Status: Abnormal   Collection Time: 01/24/18   3:00 AM  Result Value Ref Range   Sodium 139 135 - 145 mmol/L   Potassium 4.1 3.5 - 5.1 mmol/L   Chloride 100 (L) 101 - 111 mmol/L   CO2 30 22 - 32 mmol/L   Glucose, Bld 107 (H) 65 - 99 mg/dL   BUN 14 6 - 20 mg/dL   Creatinine, Ser 1.34 (H) 0.61 - 1.24 mg/dL   Calcium 9.0 8.9 - 10.3 mg/dL   GFR calc non Af Amer 47 (L) >60 mL/min   GFR calc Af Amer 54 (L) >60 mL/min    Comment: (NOTE) The eGFR has been calculated using the CKD EPI equation. This calculation has not been validated in all clinical situations. eGFR's persistently <60 mL/min signify possible Chronic Kidney Disease.    Anion gap 9 5 - 15    Comment: Performed at [facility] [address], [place], [place] [address]   No results found.     Medical Problem List and Plan: 1.  Left hemiparesis with truncal ataxia secondary to acute left posterior inferior cerebellar artery infarction  -admit to inpatient rehab 2.  DVT Prophylaxis/Anticoagulation: Subcutaneous heparin.  Monitor for any bleeding episodes 3. Pain Management: Tylenol as needed 4. Mood: Provide emotional support 5. Neuropsych: This patient is capable of making decisions on his own behalf. 6. Skin/Wound Care: Routine skin checks 7. Fluids/Electrolytes/Nutrition: Routine in and outs with follow-up chemistries 8.  Chronic systolic congestive heart failure.  Monitor for any signs of fluid overload 9.  Hypertension.  Coreg 3.125 mg twice daily 10.  History of bradycardia and PVC.  Patient asymptomatic.  Continue Coreg.  Follow cardiology services as needed 11.  CKD stage II.  Follow-up chemistries 12.  Hyperlipidemia.  Lipitor 13.  BPH.  Flomax 0.4 mg daily.  Check PVR x3 14.  Constipation.  Laxative assistance  Post Admission Physician Evaluation: 1. Functional deficits secondary  to left cerebellar infarct. 2. Patient is admitted to receive collaborative, interdisciplinary care between the physiatrist, rehab nursing staff, and therapy  team. 3. Patient's level of medical complexity and substantial therapy needs in context of that medical necessity cannot be provided at a lesser intensity of care such as a SNF. 4. Patient has experienced substantial functional loss from his/her baseline which was documented above under the "Functional History" and "Functional Status" headings.  Judging by the patient's diagnosis, physical exam, and functional history, the patient has potential for functional progress which will result in measurable gains while on inpatient rehab.  These gains will be of substantial and practical use upon discharge  in facilitating mobility and self-care at the household level. 5. Physiatrist will provide 24 hour management of medical needs as well as oversight of the therapy plan/treatment and provide guidance as appropriate regarding the interaction of the two. 6. The Preadmission Screening has been reviewed and patient status is unchanged unless otherwise stated above. 7. 24 hour rehab nursing will assist with bladder management, bowel management, safety, skin/wound care, disease management, medication administration and patient education  and help integrate therapy concepts, techniques,education, etc. 8. PT will assess and treat for/with: Lower extremity strength, range of motion, stamina, balance, functional mobility, safety, adaptive techniques and equipment, NMR, vesitbular rx/assessment.   Goals are: supervision . 9. OT will assess and treat for/with: ADL's, functional mobility, safety, upper extremity strength, adaptive techniques and equipment, NMR, vestibular rx, family ed.   Goals are: supervision to min assist. Therapy may proceed with showering this patient. 10. SLP will assess and treat for/with: n/a.  Goals are: n/a. 11. Case Management and Social Worker will assess and treat for psychological issues and discharge planning. 12. Team conference will be held weekly to assess progress toward goals and to  determine barriers to discharge. 13. Patient will receive at least 3 hours of therapy per day at least 5 days per week. 14. ELOS: 12-17 days       15. Prognosis:  excellent    I have personally performed a face to face diagnostic evaluation of this patient and formulated the key components of the plan.  Additionally, I have personally reviewed laboratory data, imaging studies, as well as relevant notes and concur with the physician assistant's documentation above.  Meredith Staggers, MD, Mellody Drown   Lavon Paganini [place], PA-C 01/25/2018

## 2018-01-25 NOTE — Progress Notes (Signed)
PROGRESS NOTE  Patrick Maxwell TGG:[id number] DOB: May 18, 1931 DOA: 01/20/2018 PCP: [name], MD   LOS: 4 days   Brief Narrative / Interim history: 84 y.o. male with a history of CAD with last cardiac catheterization May 2017, ischemic cardiomyopathy with chronic systolic heart failure with with last echo in November 13, 2017 showing ejection fraction 35 to 40%, history of bradycardia, history of PVCs PACs low at heart care, Dr. Serita Sheller, hypertension, hyperlipidemia last LDL in February 2019 at 114, history of CVA 10 years ago, prior to the emergency department in for evaluation of the onset of vertigo, and left-sided tingling.  He was found to have a CVA and was admitted to the hospital  Assessment & Plan: Principal Problem:   Acute CVA (cerebrovascular accident) [facility]) Active Problems:   History of CVA (cerebrovascular accident) without residual deficits   Hypertension, benign   Hypercholesteremia   Subclinical hypothyroidism   Palpitations   Acute combined systolic and diastolic congestive heart failure (HCC)   PVC's (premature ventricular contractions)   Coronary artery disease involving native coronary artery of native heart with unstable angina pectoris ([place])   BPH with elevated PSA   Bradycardia   Cerebellar infarct ([place])   Leukocytosis   Prediabetes   Acute CVA -Neurology consulted, appreciate input.  MRI on admission showed acute left posterior inferior cerebral artery territory infarct -CT angiogram negative for large vessel occlusion, the left PICA is partially thrombosed beginning at its origin -2D echo completed on 5/2 showed an EF of 30 to 35%, diffuse hypokinesis -Neurology recommends a TEE as well as a loop recorder, cardiology following -LDL 97, high-dose statin -A1c 5.7 -Placed on aspirin and Plavix for 3 weeks, followed by Plavix alone -PT recommending CIR, awaiting final decision -Neurology consulted, discussed with Dr. Erlinda Hong today -Cardiology  consulted, appreciate input  History of premature ventricular contractions  -followed by cardiology, as above will need loop recorder, will also evaluate for underlying paroxysmal A. Fib -Reviewed telemetry, high burden of PVCs, patient symptomatic at times -Cardiology consulted, will obtain a contrasted 2D echo to evaluate for apical thrombus, if negative may need a TEE per their recommendations.  Chronic combined systolic and diastolic CHF -Appears euvolemic, troponin negative -At home he is on Coreg, Lasix, Cozaar, continue Coreg  Coronary artery disease/ischemic cardiomyopathy -No chest pain, continue aspirin, Plavix, high-dose statin.  Chronic kidney disease stage III -Creatinine remains at baseline, recheck in the morning  Hypertension -Blood pressure within normal limits as below  Hyperlipidemia -High-dose statin as above  BPH -Continue Flomax  DVT prophylaxis: Heparin Code Status: Full code Family Communication: No family present at bedside Disposition Plan: CIR evaluation pending  Consultants:   Neurology  Cardiology  Procedures:   2D echo:  Study Conclusions - Left ventricle: The cavity size was moderately dilated. Wall thickness was normal. Systolic function was moderately to severely reduced. The estimated ejection fraction was in the range of 30% to 35%. Diffuse hypokinesis. Cannot exclude apical thrombus on non-contrast image. The study is not technically sufficient to allow evaluation of LV diastolic function. - Aortic valve: Trileaflet. Sclerosis without stenosis. There was trivial regurgitation. - Mitral valve: Mildly thickened leaflets . There was moderate regurgitation. - Left atrium: The atrium was mildly dilated. - Right ventricle: The cavity size was mildly dilated. - Tricuspid valve: There was mild regurgitation. - Pulmonary arteries: PA peak pressure: 59 mm Hg (S). - Inferior vena cava: The vessel was dilated. The respirophasic diameter changes  were blunted (< 50%),  consistent with elevated central venous pressure.  2D echo with contrast pending   Antimicrobials:  None    Subjective: -No major complaints this morning, continues to states that he is unable to walk due to dizziness.  No focal weakness, no numbness or tingling.  No chest pain, no palpitations.  No abdominal pain, nausea or vomiting  Objective: Vitals:   01/24/18 1944 01/24/18 2331 01/25/18 0355 01/25/18 0851  BP: 126/72 134/81 133/72 112/62  Pulse: (!) 43 89 (!) 43 (!) 40  Resp: 18 18 16 14   Temp: 98.2 F (36.8 C) 98.1 F (36.7 C) 98.3 F (36.8 C) (!) 97.5 F (36.4 C)  TempSrc:  Oral Oral Oral  SpO2: 94% 98% 97% 97%  Weight:   87.1 kg (192 lb 0.3 oz)   Height:        Intake/Output Summary (Last 24 hours) at 01/25/2018 1111 Last data filed at 01/25/2018 0358 Gross per 24 hour  Intake -  Output 300 ml  Net -300 ml   Filed Weights   01/21/18 1737 01/24/18 0100 01/25/18 0355  Weight: 86.4 kg (190 lb 7.6 oz) 88.1 kg (194 lb 3.6 oz) 87.1 kg (192 lb 0.3 oz)    Examination:  Constitutional: NAD Eyes: No icterus, lids and conjunctivae normal ENMT: Moist mucous membranes Respiratory: Clear to auscultation without crackles or wheezing Cardiovascular: Regular rate and rhythm, no murmurs, trace lower extremity edema Abdomen: Positive bowel sounds, soft, NT, ND Skin: No rashes seen Neurologic: 5 out of 5 in all 4 extremities, equal,   Data Reviewed: I have independently reviewed following labs and imaging studies   CBC: Recent Labs  Lab 01/20/18 2324 01/24/18 0300  WBC 11.6* 6.9  HGB 14.5 14.3  HCT 43.4 43.3  MCV 86.8 86.3  PLT 139* 124*   Basic Metabolic Panel: Recent Labs  Lab 01/20/18 2324 01/21/18 0030 01/24/18 0300  NA 139  --  139  K 4.1  --  4.1  CL 108  --  100*  CO2 21*  --  30  GLUCOSE 215*  --  107*  BUN 22*  --  14  CREATININE 1.29*  --  1.34*  CALCIUM 8.9  --  9.0  MG  --  1.9  --    GFR: Estimated Creatinine  Clearance: 43.7 mL/min (A) (by C-G formula based on SCr of 1.34 mg/dL (H)). Liver Function Tests: No results for input(s): AST, ALT, ALKPHOS, BILITOT, PROT, ALBUMIN in the last 168 hours. No results for input(s): LIPASE, AMYLASE in the last 168 hours. No results for input(s): AMMONIA in the last 168 hours. Coagulation Profile: No results for input(s): INR, PROTIME in the last 168 hours. Cardiac Enzymes: No results for input(s): CKTOTAL, CKMB, CKMBINDEX, TROPONINI in the last 168 hours. BNP (last 3 results) No results for input(s): PROBNP in the last 8760 hours. HbA1C: No results for input(s): HGBA1C in the last 72 hours. CBG: Recent Labs  Lab 01/20/18 2318  GLUCAP 170*   Lipid Profile: No results for input(s): CHOL, HDL, LDLCALC, TRIG, CHOLHDL, LDLDIRECT in the last 72 hours. Thyroid Function Tests: No results for input(s): TSH, T4TOTAL, FREET4, T3FREE, THYROIDAB in the last 72 hours. Anemia Panel: No results for input(s): VITAMINB12, FOLATE, FERRITIN, TIBC, IRON, RETICCTPCT in the last 72 hours. Urine analysis:    Component Value Date/Time   COLORURINE YELLOW 01/20/2018 2311   APPEARANCEUR HAZY (A) 01/20/2018 2311   LABSPEC 1.019 01/20/2018 2311   PHURINE 5.0 01/20/2018 2311   GLUCOSEU NEGATIVE 01/20/2018  [place] (A) 01/20/2018 2311   BILIRUBINUR NEGATIVE 01/20/2018 2311   KETONESUR NEGATIVE 01/20/2018 2311   PROTEINUR 30 (A) 01/20/2018 2311   UROBILINOGEN 1.0 10/06/2009 1406   NITRITE NEGATIVE 01/20/2018 2311   LEUKOCYTESUR NEGATIVE 01/20/2018 2311   Sepsis Labs: Invalid input(s): PROCALCITONIN, LACTICIDVEN  No results found for this or any previous visit (from the past 240 hour(s)).    Radiology Studies: No results found.   Scheduled Meds: . aspirin EC  81 mg Oral Daily  . atorvastatin  40 mg Oral q1800  . carvedilol  3.125 mg Oral BID  . cholecalciferol  5,000 Units Oral Daily  . clopidogrel  75 mg Oral Daily  . heparin  5,000 Units Subcutaneous  Q8H  . polyethylene glycol  17 g Oral Daily  . senna-docusate  2 tablet Oral Once  . tamsulosin  0.4 mg Oral Daily   Continuous Infusions:   Marzetta Board, MD, PhD [facility] Pager [phone]  If 7PM-7AM, please contact night-coverage [email or web address] Password [email or web address] 01/25/2018, 11:11 AM

## 2018-01-26 ENCOUNTER — Inpatient Hospital Stay (HOSPITAL_COMMUNITY): Payer: PPO

## 2018-01-26 ENCOUNTER — Other Ambulatory Visit: Payer: Self-pay

## 2018-01-26 ENCOUNTER — Encounter (HOSPITAL_COMMUNITY)
Admission: EM | Disposition: A | Discharge status: Rehabilitation Facility | Payer: Self-pay | Source: Home / Self Care | Attending: Internal Medicine

## 2018-01-26 ENCOUNTER — Encounter (HOSPITAL_COMMUNITY): Payer: Self-pay | Admitting: *Deleted

## 2018-01-26 ENCOUNTER — Inpatient Hospital Stay (HOSPITAL_COMMUNITY)
Admission: RE | Admit: 2018-01-26 | Discharge: 2018-02-01 | DRG: 057 | Disposition: A | Discharge status: Home Health | Payer: PPO | Source: Intra-hospital | Attending: Physical Medicine & Rehabilitation | Admitting: Physical Medicine & Rehabilitation

## 2018-01-26 ENCOUNTER — Other Ambulatory Visit: Payer: Self-pay | Admitting: Physician Assistant

## 2018-01-26 DIAGNOSIS — R269 Unspecified abnormalities of gait and mobility: Secondary | ICD-10-CM | POA: Diagnosis not present

## 2018-01-26 DIAGNOSIS — I130 Hypertensive heart and chronic kidney disease with heart failure and stage 1 through stage 4 chronic kidney disease, or unspecified chronic kidney disease: Secondary | ICD-10-CM | POA: Diagnosis not present

## 2018-01-26 DIAGNOSIS — I63549 Cerebral infarction due to unspecified occlusion or stenosis of unspecified cerebellar artery: Secondary | ICD-10-CM

## 2018-01-26 DIAGNOSIS — I2510 Atherosclerotic heart disease of native coronary artery without angina pectoris: Secondary | ICD-10-CM | POA: Diagnosis present

## 2018-01-26 DIAGNOSIS — E785 Hyperlipidemia, unspecified: Secondary | ICD-10-CM | POA: Diagnosis present

## 2018-01-26 DIAGNOSIS — I5022 Chronic systolic (congestive) heart failure: Secondary | ICD-10-CM | POA: Diagnosis present

## 2018-01-26 DIAGNOSIS — I493 Ventricular premature depolarization: Secondary | ICD-10-CM

## 2018-01-26 DIAGNOSIS — Z809 Family history of malignant neoplasm, unspecified: Secondary | ICD-10-CM | POA: Diagnosis not present

## 2018-01-26 DIAGNOSIS — I5041 Acute combined systolic (congestive) and diastolic (congestive) heart failure: Secondary | ICD-10-CM

## 2018-01-26 DIAGNOSIS — I69398 Other sequelae of cerebral infarction: Secondary | ICD-10-CM | POA: Diagnosis not present

## 2018-01-26 DIAGNOSIS — N182 Chronic kidney disease, stage 2 (mild): Secondary | ICD-10-CM | POA: Diagnosis not present

## 2018-01-26 DIAGNOSIS — I340 Nonrheumatic mitral (valve) insufficiency: Secondary | ICD-10-CM

## 2018-01-26 DIAGNOSIS — I69393 Ataxia following cerebral infarction: Secondary | ICD-10-CM

## 2018-01-26 DIAGNOSIS — Z7982 Long term (current) use of aspirin: Secondary | ICD-10-CM

## 2018-01-26 DIAGNOSIS — I69354 Hemiplegia and hemiparesis following cerebral infarction affecting left non-dominant side: Secondary | ICD-10-CM | POA: Diagnosis not present

## 2018-01-26 DIAGNOSIS — Z8673 Personal history of transient ischemic attack (TIA), and cerebral infarction without residual deficits: Secondary | ICD-10-CM

## 2018-01-26 DIAGNOSIS — E039 Hypothyroidism, unspecified: Secondary | ICD-10-CM

## 2018-01-26 DIAGNOSIS — Z823 Family history of stroke: Secondary | ICD-10-CM

## 2018-01-26 DIAGNOSIS — R001 Bradycardia, unspecified: Secondary | ICD-10-CM | POA: Diagnosis not present

## 2018-01-26 DIAGNOSIS — I428 Other cardiomyopathies: Secondary | ICD-10-CM

## 2018-01-26 DIAGNOSIS — R002 Palpitations: Secondary | ICD-10-CM

## 2018-01-26 DIAGNOSIS — I10 Essential (primary) hypertension: Secondary | ICD-10-CM

## 2018-01-26 DIAGNOSIS — I255 Ischemic cardiomyopathy: Secondary | ICD-10-CM

## 2018-01-26 HISTORY — DX: Cerebral infarction due to unspecified occlusion or stenosis of unspecified cerebellar artery: I63.549

## 2018-01-26 HISTORY — PX: TEE WITHOUT CARDIOVERSION: SHX5443

## 2018-01-26 LAB — BASIC METABOLIC PANEL
ANION GAP: 8 (ref 5–15)
BUN: 19 mg/dL (ref 6–20)
CHLORIDE: 106 mmol/L (ref 101–111)
CO2: 27 mmol/L (ref 22–32)
Calcium: 8.7 mg/dL — ABNORMAL LOW (ref 8.9–10.3)
Creatinine, Ser: 1.25 mg/dL — ABNORMAL HIGH (ref 0.61–1.24)
GFR calc Af Amer: 59 mL/min — ABNORMAL LOW (ref >60)
GFR calc non Af Amer: 51 mL/min — ABNORMAL LOW (ref >60)
GLUCOSE: 102 mg/dL — AB (ref 65–99)
POTASSIUM: 4.1 mmol/L (ref 3.5–5.1)
Sodium: 141 mmol/L (ref 135–145)

## 2018-01-26 LAB — CBC
HCT: 41.9 % (ref 39.0–52.0)
Hemoglobin: 14.4 g/dL (ref 13.0–17.0)
MCH: 29.4 pg (ref 26.0–34.0)
MCHC: 34.4 g/dL (ref 30.0–36.0)
MCV: 85.5 fL (ref 78.0–100.0)
Platelets: 125 10*3/uL — ABNORMAL LOW (ref 150–400)
RBC: 4.9 MIL/uL (ref 4.22–5.81)
RDW: 16 % — ABNORMAL HIGH (ref 11.5–15.5)
WBC: 6.6 10*3/uL (ref 4.0–10.5)

## 2018-01-26 SURGERY — LOOP RECORDER INSERTION

## 2018-01-26 SURGERY — ECHOCARDIOGRAM, TRANSESOPHAGEAL
Anesthesia: Moderate Sedation

## 2018-01-26 MED ORDER — APIXABAN 5 MG PO TABS: 5.0000 mg | ORAL_TABLET | Freq: Two times a day (BID) | ORAL | Status: DC

## 2018-01-26 MED ORDER — TAMSULOSIN HCL 0.4 MG PO CAPS
0.4000 mg | ORAL_CAPSULE | Freq: Every day | ORAL | Status: DC
  Administered 2018-01-27 – 2018-02-01 (×6): 0.4 mg via ORAL
  Filled 2018-01-26 (×6): qty 1

## 2018-01-26 MED ORDER — ACETAMINOPHEN 160 MG/5ML PO SOLN: 650.0000 mg | ORAL | Status: DC | PRN

## 2018-01-26 MED ORDER — VITAMIN D 1000 UNITS PO TABS
5000.0000 [IU] | ORAL_TABLET | Freq: Every day | ORAL | Status: DC
  Administered 2018-01-27 – 2018-02-01 (×6): 5000 [IU] via ORAL
  Filled 2018-01-26 (×6): qty 5

## 2018-01-26 MED ORDER — FENTANYL CITRATE (PF) 100 MCG/2ML IJ SOLN
INTRAMUSCULAR | Status: AC
  Filled 2018-01-26: qty 2

## 2018-01-26 MED ORDER — ATORVASTATIN CALCIUM 40 MG PO TABS: 40.0000 mg | ORAL_TABLET | Freq: Every day | ORAL | Status: DC

## 2018-01-26 MED ORDER — ATORVASTATIN CALCIUM 40 MG PO TABS
40.0000 mg | ORAL_TABLET | Freq: Every day | ORAL | Status: DC
  Administered 2018-01-26 – 2018-01-31 (×6): 40 mg via ORAL
  Filled 2018-01-26 (×6): qty 1

## 2018-01-26 MED ORDER — LIDOCAINE VISCOUS 2 % MT SOLN
OROMUCOSAL | Status: AC
  Filled 2018-01-26: qty 15

## 2018-01-26 MED ORDER — MIDAZOLAM HCL 10 MG/2ML IJ SOLN
INTRAMUSCULAR | Status: DC | PRN
  Administered 2018-01-26: 2 mg via INTRAVENOUS

## 2018-01-26 MED ORDER — HEPARIN SODIUM (PORCINE) 5000 UNIT/ML IJ SOLN
5000.0000 [IU] | Freq: Three times a day (TID) | INTRAMUSCULAR | Status: DC
  Administered 2018-01-26 – 2018-01-27 (×2): 5000 [IU] via SUBCUTANEOUS
  Filled 2018-01-26 (×2): qty 1

## 2018-01-26 MED ORDER — POLYETHYLENE GLYCOL 3350 17 G PO PACK
17.0000 g | PACK | Freq: Every day | ORAL | Status: DC
  Administered 2018-01-27 – 2018-01-29 (×2): 17 g via ORAL
  Filled 2018-01-26 (×6): qty 1

## 2018-01-26 MED ORDER — FENTANYL CITRATE (PF) 100 MCG/2ML IJ SOLN
INTRAMUSCULAR | Status: DC | PRN
  Administered 2018-01-26: 25 ug via INTRAVENOUS

## 2018-01-26 MED ORDER — DIPHENHYDRAMINE HCL 50 MG/ML IJ SOLN
INTRAMUSCULAR | Status: AC
  Filled 2018-01-26: qty 1

## 2018-01-26 MED ORDER — ASPIRIN EC 81 MG PO TBEC
81.0000 mg | DELAYED_RELEASE_TABLET | Freq: Every day | ORAL | Status: DC
  Administered 2018-01-27 – 2018-02-01 (×6): 81 mg via ORAL
  Filled 2018-01-26 (×6): qty 1

## 2018-01-26 MED ORDER — SORBITOL 70 % SOLN: 30.0000 mL | Freq: Every day | Status: DC | PRN

## 2018-01-26 MED ORDER — CARVEDILOL 3.125 MG PO TABS
3.1250 mg | ORAL_TABLET | Freq: Two times a day (BID) | ORAL | Status: DC
  Administered 2018-01-27 – 2018-02-01 (×7): 3.125 mg via ORAL
  Filled 2018-01-26 (×12): qty 1

## 2018-01-26 MED ORDER — SENNOSIDES-DOCUSATE SODIUM 8.6-50 MG PO TABS: 1.0000 | ORAL_TABLET | Freq: Every evening | ORAL | Status: DC | PRN

## 2018-01-26 MED ORDER — BUTAMBEN-TETRACAINE-BENZOCAINE 2-2-14 % EX AERO
INHALATION_SPRAY | CUTANEOUS | Status: DC | PRN
  Administered 2018-01-26: 2 via TOPICAL

## 2018-01-26 MED ORDER — APIXABAN 5 MG PO TABS
5.0000 mg | ORAL_TABLET | Freq: Two times a day (BID) | ORAL | Status: DC
  Administered 2018-01-26: 5 mg via ORAL

## 2018-01-26 MED ORDER — ACETAMINOPHEN 650 MG RE SUPP: 650.0000 mg | RECTAL | Status: DC | PRN

## 2018-01-26 MED ORDER — MIDAZOLAM HCL 5 MG/ML IJ SOLN
INTRAMUSCULAR | Status: AC
  Filled 2018-01-26: qty 2

## 2018-01-26 MED ORDER — ACETAMINOPHEN 325 MG PO TABS: 650.0000 mg | ORAL_TABLET | ORAL | Status: DC | PRN

## 2018-01-26 MED ORDER — MECLIZINE HCL 25 MG PO TABS: 25.0000 mg | ORAL_TABLET | Freq: Three times a day (TID) | ORAL | Status: DC | PRN

## 2018-01-26 NOTE — Progress Notes (Signed)
Physical Therapy Treatment Note  Patient agreeable to participate in therapy and eager to find out when he will have his procedure this am. Wife present during session and very supportive. Pt continues to demonstrate L side weakness and required min/mod A for transfers and gait training. Pt with LOB with all directional changes and turning due to scissoring. Continue to progress as tolerated and recommend CIR level therapies.     01/26/18 0928  PT Visit Information  Last PT Received On 01/26/18  Assistance Needed +1  History of Present Illness [name] is a 84 y.o. male with onset of dysarthria and dizziness due to left posterior inferior cerebellar artery infarctstroke, HTN, PVCs, CHF, and CAD.   Subjective Data  Patient Stated Goal to get back to normal  Precautions  Precautions Fall  Precaution Comments left sided ataxia and left lateral lean  Pain Assessment  Pain Assessment No/denies pain  Cognition  Arousal/Alertness Awake/alert  Behavior During Therapy [facility] for tasks assessed/performed  Overall Cognitive Status Within Functional Limits for tasks assessed  Bed Mobility  Overal bed mobility Needs Assistance  Bed Mobility Supine to Sit;Sit to Supine  Supine to sit Supervision  Sit to supine Supervision  General bed mobility comments supervision for safety; c/o dizziness upon sitting   Transfers  Overall transfer level Needs assistance  Equipment used Rolling walker (2 wheeled)  Transfers Sit to/from Stand  Sit to Stand Min assist  General transfer comment assist to balance and cues for safe hand placement  Ambulation/Gait  Ambulation/Gait assistance Mod assist  Ambulation Distance (Feet)  (hallway ambulation)  Assistive device Rolling walker (2 wheeled)  Gait Pattern/deviations Step-through pattern;Ataxic;Drifts right/left;Scissoring;Narrow base of support;Decreased stride length  General Gait Details cues for posture/forward gaze, safe proximity to RW, and gait  velocity; pt tends to increase gait velocity increasing ataxia of L LE; LOB with each turn/directional change requiring assistance and increased unsteadiness with horizontal head turns  Balance  Overall balance assessment Needs assistance  Sitting balance-Leahy Scale Fair  Standing balance-Leahy Scale Poor  General Comments  General comments (skin integrity, edema, etc.) wife present   PT - End of Session  Equipment Utilized During Treatment Gait belt  Activity Tolerance Patient tolerated treatment well  Patient left with call bell/phone within reach;in bed;with family/visitor present  Nurse Communication Mobility status   PT - Assessment/Plan  PT Plan Current plan remains appropriate  PT Visit Diagnosis Ataxic gait (R26.0);Difficulty in walking, not elsewhere classified (R26.2);Other symptoms and signs involving the nervous system (R29.898)  PT Frequency (ACUTE ONLY) Min 4X/week  Follow Up Recommendations CIR  PT equipment 3in1 (PT)  AM-PAC PT "6 Clicks" Daily Activity Outcome Measure  Difficulty turning over in bed (including adjusting bedclothes, sheets and blankets)? 4  Difficulty moving from lying on back to sitting on the side of the bed?  4  Difficulty sitting down on and standing up from a chair with arms (e.g., wheelchair, bedside commode, etc,.)? 1  Help needed moving to and from a bed to chair (including a wheelchair)? 3  Help needed walking in hospital room? 2  Help needed climbing 3-5 steps with a railing?  2  6 Click Score 16  Mobility G Code  CK  PT Goal Progression  Progress towards PT goals Progressing toward goals  PT Time Calculation  PT Start Time (ACUTE ONLY) 0900  PT Stop Time (ACUTE ONLY) 6269  PT Time Calculation (min) (ACUTE ONLY) 22 min  PT General Charges  $$ ACUTE PT  VISIT 1 Visit  PT Treatments  $Gait Training 8-22 mins   Earney Navy, PTA Pager: [phone]

## 2018-01-26 NOTE — Progress Notes (Signed)
Jamse Arn, MD  Physician  Physical Medicine and Rehabilitation  Consult Note  Signed  Date of Service:  01/23/2018 12:28 PM       Related encounter: ED to Hosp-Admission (Current) from 01/20/2018 in [place] [facility]      Signed      Expand All Collapse All      Show:Clear all [x] Manual[x] Template[] Copied  Added by: [x] Jamse Arn, MD   [] Hover for details        Physical Medicine and Rehabilitation Consult Reason for Consult: Stroke Referring Physician: Wyvonnia Dusky   HPI: Patrick Maxwell is a 84 y.o. male with past medical history of CVA 2009, hypertension, hyperlipidemia, chronic systolic CHF, CAD, bradycardia, PVCs presented on 01/21/18 with left PICA stroke. History taken from chart review grandson, and patient. Patient started to experience vertigo and tingling on his left  He was noted to be ataxic. When he presented to the ED(vertigo and tingling and resolved. Neurology was consult and workup initiated. MRI brain ordered, reviewed showing left cerebellar infarct. Per report, acute left posterior inferior cerebellar artery infarct with chronic small vessel disease and old left ganglia and corona radiata infarcts. CT angiogram negative for large vessel occlusion, left PICA partial thrombosis. Echo completed showing ejection fraction of 30-35% with diffuse hypokinesis. Neurology recommending a TEE and loop recorder. Recommending aspirin and Plavix fr 3 weeks, then Plavix alone. Hospital course further complicated by hyperglycemia and leukocytosis. Patient noted to be ambulating in room with assistance of grandson with episodes of loss of balance. When instructed patient to not ambulate without staff, stating that he has been doing this and is fine as long as he has somebody next to him. Discussed with nursing regarding patient education. Patient lives with his wife who can provide supervision with family nearby.  Review of Systems    Neurological: Positive for tingling and weakness.  All other systems reviewed and are negative.      Past Medical History:  Diagnosis Date  . CAD (coronary artery disease)   . CHF (congestive heart failure) ([place])   . Frequent unifocal PVCs    Palpitations since early 20s, PVCs and pairs  . Hyperlipidemia   . Hypertension   . Stroke [facility]) 2009        Past Surgical History:  Procedure Laterality Date  . CARDIAC CATHETERIZATION N/A 01/23/2016   Procedure: Right/Left Heart Cath and Coronary Angiography;  Surgeon: Burnell Blanks, MD;  Location: [place] CV [facility];  Service: Cardiovascular;  Laterality: N/A;  . CARDIAC CATHETERIZATION N/A 01/23/2016   Procedure: Coronary Stent Intervention;  Surgeon: Burnell Blanks, MD;  Location: [place] [facility];  Service: Cardiovascular;  Laterality: N/A;  . HERNIA REPAIR  06/1992        Family History  Problem Relation Age of Onset  . Stroke Mother   . Early death Mother   . Cancer Brother    Social History:  reports that he has never smoked. He has never used smokeless tobacco. He reports that he does not drink alcohol or use drugs. Allergies: No Known Allergies       Medications Prior to Admission  Medication Sig Dispense Refill  . Ascorbic Acid (VITAMIN C PO) Take 250 mg by mouth 3 (three) times a week.     Marland Kitchen aspirin 81 MG tablet Take 81 mg by mouth daily.     . carvedilol (COREG) 3.125 MG tablet Take 1 tablet (3.125 mg total) by mouth 2 (two) times  daily. 180 tablet 3  . Cholecalciferol (VITAMIN D PO) Take 5,000 Units by mouth daily.    . Coenzyme Q10 (COQ10) 100 MG CAPS Take 1 capsule by mouth daily.    . furosemide (LASIX) 20 MG tablet TAKE ONE TABLET BY MOUTH ONCE DAILY 90 tablet 1  . losartan (COZAAR) 25 MG tablet Take 0.5 tablets (12.5 mg total) by mouth daily. 90 tablet 3  . Magnesium 100 MG TABS Take 1 tablet by mouth 2 (two) times daily.    . Multiple Vitamin (MULTIVITAMIN WITH MINERALS)  TABS tablet Take 1 tablet by mouth daily.    . nitroGLYCERIN (NITROSTAT) 0.4 MG SL tablet Place 1 tablet (0.4 mg total) under the tongue every 5 (five) minutes as needed. 25 tablet 3  . Potassium 99 MG TABS Take 99 mg by mouth daily.     . tamsulosin (FLOMAX) 0.4 MG CAPS capsule Take 1 capsule (0.4 mg total) by mouth daily. 30 capsule 3  . vitamin E 400 UNIT capsule Take 400 Units by mouth 3 (three) times a week.    Marland Kitchen atorvastatin (LIPITOR) 20 MG tablet Take 1 tablet (20 mg total) by mouth daily. (Patient not taking: Reported on 01/21/2018) 90 tablet 3  . benzonatate (TESSALON) 100 MG capsule Take 1 capsule (100 mg total) by mouth 3 (three) times daily as needed for cough. (Patient not taking: Reported on 01/21/2018) 20 capsule 0    Home: Home Living Family/patient expects to be discharged to:: Private residence Living Arrangements: Spouse/significant other Available Help at Discharge: Family Type of Home: House Home Access: Stairs to enter Technical brewer of Steps: 4 Entrance Stairs-Rails: Right Home Layout: One level Biochemist, clinical: Handicapped height Duson: None  Functional History: Prior Function Level of Independence: Independent Functional Status:  Mobility: Bed Mobility Overal bed mobility: Needs Assistance Bed Mobility: Supine to Sit Supine to sit: Supervision Sit to supine: Min assist General bed mobility comments: Supervision for safety, pt reporting dizziness and nausea with transitions.  Transfers Overall transfer level: Needs assistance Equipment used: 2 person hand held assist Transfers: Sit to/from Stand Sit to Stand: Mod assist, +2 physical assistance General transfer comment: Mod assist to prevent anterior and left LOB when coming to stand, the thrusted forward and needed cues to find midline.  Ambulation/Gait Ambulation/Gait assistance: Mod assist, +2 physical assistance Ambulation Distance (Feet): 20 Feet Assistive device: 2 person hand  held assist Gait Pattern/deviations: Step-through pattern, Ataxic, Staggering left, Scissoring General Gait Details: Pt with very narrow BOS, near scissoring, left anterior lateral lean.  Cues to wide base during gait.  Some awareness of where he is leaning, just difficult to correct right now, dizziness with gait, nausea bag brought with [place].   Gait velocity interpretation: <1.8 ft/sec, indicate of risk for recurrent falls  ADL: ADL Overall ADL's : Needs assistance/impaired Eating/Feeding: Sitting, Minimal assistance Grooming: Sitting, Minimal assistance Toileting- Clothing Manipulation and Hygiene: Maximal assistance, Sit to/from stand, Cueing for sequencing, Cueing for safety Toileting - Clothing Manipulation Details (indicate cue type and reason): sit to stand noted.  ataxia noted with sit to stand transition as well as standing  Cognition: Cognition Overall Cognitive Status: Within Functional Limits for tasks assessed Orientation Level: Oriented X4 Cognition Arousal/Alertness: Awake/alert Behavior During Therapy: WFL for tasks assessed/performed Overall Cognitive Status: Within Functional Limits for tasks assessed  Blood pressure 119/73, pulse (!) 44, temperature 98.1 F (36.7 C), temperature source Oral, resp. rate 18, height 5\' 11"  (1.803 m), weight 86.4 kg (190 lb 7.6 oz),  SpO2 94 %. Physical Exam  Vitals reviewed. Constitutional: He is oriented to person, place, and time. He appears well-developed and well-nourished.  HENT:  Head: Normocephalic and atraumatic.  Eyes: EOM are normal. Right eye exhibits no discharge. Left eye exhibits no discharge.  Neck: Normal range of motion. Neck supple.  Cardiovascular: Regular rhythm.  +bradycardia  Respiratory: Effort normal and breath sounds normal.  GI: Soft. Bowel sounds are normal.  Musculoskeletal:  No edema or tenderness in extremities  Neurological: He is alert and oriented to person, place, and time.  Motor: Right upper  extremity/right lower extremity: 5/5 proximal to distal Left upper extremity/left lower extremity: 4+/5 proximal distal No ataxia in upper extremities, ataxic gait noted Sensation intact light touch  Skin: Skin is warm and dry.  Psychiatric: His affect is blunt. He expresses impulsivity.    LabResultsLast24Hours  No results found for this or any previous visit (from the past 24 hour(s)).   ImagingResults(Last48hours)  No results found.    Assessment/Plan: Diagnosis: Acute left posterior inferior cerebellar artery infarct  Labs and images independently reviewed.  Records reviewed and summated above. Stroke: Continue secondary stroke prophylaxis and Risk Factor Modification listed below:   Antiplatelet therapy:   Blood Pressure Management:  Continue current medication with prn's with permisive HTN per primary team Statin Agent:   Pre-diabetes management:   Left sided hemiparesis: fit for orthosis to prevent contractures (resting hand splint for day, wrist cock up splint at night, PRAFO, etc)  1. Does the need for close, 24 hr/day medical supervision in concert with the patient's rehab needs make it unreasonable for this patient to be served in a less intensive setting? Yes  2. Co-Morbidities requiring supervision/potential complications: leukocytosis (cont to monitor for signs and symptoms of infection, further workup if indicated), CVA 2009, HTN (monitor and provide prns in accordance with increased physical exertion and pain), hyperlipidemia, chronic systolic CHF (Monitor in accordance with increased physical activity and avoid UE resistance excercises), CAD (cont meds), bradycardia (monitor heart rate with increased physical activity), PVCs (monitor), Prediabetes (Monitor in accordance with exercise and adjust meds as necessary) 3. Due to safety, disease management and patient education, does the patient require 24 hr/day rehab nursing? Yes 4. Does the patient require  coordinated care of a physician, rehab nurse, PT (1-2 hrs/day, 5 days/week) and OT (1-2 hrs/day, 5 days/week) to address physical and functional deficits in the context of the above medical diagnosis(es)? Yes Addressing deficits in the following areas: balance, endurance, locomotion, strength, transferring, bathing, dressing, toileting and psychosocial support 5. Can the patient actively participate in an intensive therapy program of at least 3 hrs of therapy per day at least 5 days per week? Yes 6. The potential for patient to make measurable gains while on inpatient rehab is excellent 7. Anticipated functional outcomes upon discharge from inpatient rehab are supervision and min assist  with PT, supervision and min assist with OT, n/a with SLP. 8. Estimated rehab length of stay to reach the above functional goals is: 12-17 days. 9. Anticipated D/C setting: Home 10. Anticipated post D/C treatments: HH therapy and Home excercise program 11. Overall Rehab/Functional Prognosis: good  RECOMMENDATIONS: This patient's condition is appropriate for continued rehabilitative care in the following setting: Recommend CIR after completion of medical workup if patient willing to stay in the hospital and be compliant with recommendations. Patient has agreed to participate in recommended program. Potentially Note that insurance prior authorization may be required for reimbursement for recommended care.  Comment: Rehab  Admissions Coordinator to follow up.  Delice Lesch, MD, ABPMR 01/23/2018           Routing History

## 2018-01-26 NOTE — Progress Notes (Signed)
Patient resting comfortably awaiting procedure. Medications not given due to NPO status.

## 2018-01-26 NOTE — CV Procedure (Signed)
    TRANSESOPHAGEAL ECHOCARDIOGRAM   NAME:  Patrick Maxwell   MRN: [id number] DOB:  08/21/1931   ADMIT DATE: 01/20/2018  INDICATIONS:   PROCEDURE:   Informed consent was obtained prior to the procedure. The risks, benefits and alternatives for the procedure were discussed and the patient comprehended these risks.  Risks include, but are not limited to, cough, sore throat, vomiting, nausea, somnolence, esophageal and stomach trauma or perforation, bleeding, low blood pressure, aspiration, pneumonia, infection, trauma to the teeth and death.    After a procedural time-out, the patient was given 2 mg versed and 25 mcg fentanyl for moderate sedation.  The oropharynx was anesthetized with topical cetacaine.  The transesophageal probe was inserted in the esophagus and stomach without difficulty and multiple views were obtained.    COMPLICATIONS:    There were no immediate complications.  FINDINGS:  LEFT VENTRICLE: EF = 20-25%. Dilated. Severe global HK. No apical clot  RIGHT VENTRICLE: Moderately to severe global HK  LEFT ATRIUM: Markedly dilated.  LA diameter +6.6cm. + smoke   LEFT ATRIAL APPENDAGE: No thrombus.   RIGHT ATRIUM: Normal  AORTIC VALVE:  Trileaflet. Mildly calcified. Trivial AI  MITRAL VALVE:    Normal. Mild to moderate central MR  TRICUSPID VALVE: Normal. Moderate to severe TR. RVSP ~13mmHG  PULMONIC VALVE: Grossly normal. Mild PI.   INTERATRIAL SEPTUM: Borderline ASA. No PFO or ASD. Bubble study negative  PERICARDIUM: No effusion  DESCENDING AORTA: Severe plaque.   Frequent PVCs and bigeminy. 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  Kathlee Barnhardt,MD 12:49 PM

## 2018-01-26 NOTE — Progress Notes (Signed)
Progress Note  Patient Name: Patrick Maxwell Date of Encounter: 01/26/2018  Primary Cardiologist: Dr. Angelena Form  Subjective   84 year old gentleman with a history of coronary artery disease, status post drug-eluting stent to the LAD and obtuse marginal artery in May, 2017, ischemic cardia myopathy, chronic systolic congestive heart failure, hypertension, hyperlipidemia and a previous stroke.  He was admitted this week after having a cerebellar  stroke.  Echocardiogram did not reveal any apical thrombus yesterday.  He is scheduled for transesophageal echo today at 1230.  Pt feeling well today. No chest pain or palpitations. Echo yesterday did not reveal any apical thrombus.   Going for TEE today for further eval of his CVA    Inpatient Medications    Scheduled Meds: . aspirin EC  81 mg Oral Daily  . atorvastatin  40 mg Oral q1800  . carvedilol  3.125 mg Oral BID  . cholecalciferol  5,000 Units Oral Daily  . clopidogrel  75 mg Oral Daily  . heparin  5,000 Units Subcutaneous Q8H  . polyethylene glycol  17 g Oral Daily  . senna-docusate  2 tablet Oral Once  . tamsulosin  0.4 mg Oral Daily   Continuous Infusions:  PRN Meds: acetaminophen **OR** acetaminophen (TYLENOL) oral liquid 160 mg/5 mL **OR** acetaminophen, hydrALAZINE, meclizine, senna-docusate   Vital Signs    Vitals:   01/25/18 2325 01/26/18 0321 01/26/18 0451 01/26/18 0825  BP: 113/62 138/73  140/75  Pulse: (!) 41 (!) 43  (!) 40  Resp: 16 16  16   Temp:    97.7 F (36.5 C)  TempSrc:    Oral  SpO2: 96% 95%  99%  Weight:   194 lb 0.1 oz (88 kg)   Height:        Intake/Output Summary (Last 24 hours) at 01/26/2018 1052 Last data filed at 01/26/2018 0500 Gross per 24 hour  Intake -  Output 550 ml  Net -550 ml   Filed Weights   01/24/18 0100 01/25/18 0355 01/26/18 0451  Weight: 194 lb 3.6 oz (88.1 kg) 192 lb 0.3 oz (87.1 kg) 194 lb 0.1 oz (88 kg)    Physical Exam   Physical Exam: Blood pressure 140/75,  pulse (!) 40, temperature 97.7 F (36.5 C), temperature source Oral, resp. rate 16, height 5\' 11"  (1.803 m), weight 194 lb 0.1 oz (88 kg), SpO2 99 %.  GEN:  Well nourished, well developed in no acute distress HEENT: Normal NECK: No JVD; No carotid bruits LYMPHATICS: No lymphadenopathy CARDIAC: RRR  RESPIRATORY:  Clear to auscultation without rales, wheezing or rhonchi  ABDOMEN: Soft, non-tender, non-distended MUSCULOSKELETAL:  No edema; No deformity  SKIN: Warm and dry NEUROLOGIC:  Alert and oriented x 3,  Somewhat unstable on his feet    Labs    Chemistry Recent Labs  Lab 01/20/18 2324 01/24/18 0300 01/26/18 0542  NA 139 139 141  K 4.1 4.1 4.1  CL 108 100* 106  CO2 21* 30 27  GLUCOSE 215* 107* 102*  BUN 22* 14 19  CREATININE 1.29* 1.34* 1.25*  CALCIUM 8.9 9.0 8.7*  GFRNONAA 49* 47* 51*  GFRAA 57* 54* 59*  ANIONGAP 10 9 8      Hematology Recent Labs  Lab 01/20/18 2324 01/24/18 0300 01/26/18 0542  WBC 11.6* 6.9 6.6  RBC 5.00 5.02 4.90  HGB 14.5 14.3 14.4  HCT 43.4 43.3 41.9  MCV 86.8 86.3 85.5  MCH 29.0 28.5 29.4  MCHC 33.4 33.0 34.4  RDW 15.8* 15.5  16.0*  PLT 139* 129* 125*    Cardiac EnzymesNo results for input(s): TROPONINI in the last 168 hours.  Recent Labs  Lab 01/21/18 0139  TROPIPOC 0.01     BNP Recent Labs  Lab 01/21/18 0137  BNP 2,431.3*     DDimer No results for input(s): DDIMER in the last 168 hours.   Radiology    No results found.  Telemetry    01/25/18 Pt with periods of bradycardia in the 40's - Personally Reviewed  ECG    01/21/18 NSR HR 90- Personally Reviewed  Cardiac Studies   Echo 01/21/18: Study Conclusions - Left ventricle: The cavity size was moderately dilated. Wall thickness was normal. Systolic function was moderately to severely reduced. The estimated ejection fraction was in the range of 30% to 35%. Diffuse hypokinesis. Cannot exclude apical thrombus on non-contrast image. The study is not  technically sufficient to allow evaluation of LV diastolic function. - Aortic valve: Trileaflet. Sclerosis without stenosis. There was trivial regurgitation. - Mitral valve: Mildly thickened leaflets . There was moderate regurgitation. - Left atrium: The atrium was mildly dilated. - Right ventricle: The cavity size was mildly dilated. - Tricuspid valve: There was mild regurgitation. - Pulmonary arteries: PA peak pressure: 59 mm Hg (S). - Inferior vena cava: The vessel was dilated. The respirophasic diameter changes were blunted (<50%), consistent with elevated central venous pressure.  Impressions: - Compared to a prior study in 10/2017, the LVEF is lower at 30-35%. Cannot exclude apical thrombus - recommend limited Definity contrast study to evaluate.   Holter 11/25/17: Recs: PVCs - restarted coreg  Sinus rhythm Frequent Premature ventricular contractions, ventricular bigeminy with couplets and triplets Frequent Premature atrial contractions.    Right and left heart cath 01/23/16:  Mid LAD lesion, 99% stenosed.  Mid LAD to Dist LAD lesion, 20% stenosed.  Mid RCA to Dist RCA lesion, 20% stenosed.  RPDA lesion, 20% stenosed.  Ost LAD to Prox LAD lesion, 50% stenosed. Post intervention, there is a 0% residual stenosis.  2nd Mrg lesion, 80% stenosed. Post intervention, there is a 0% residual stenosis.  1. Severe double vessel CAD 2. Unstable angina.  3. Ischemic cardiomyopathy 4. Severe stenosis mid LAD. Now s/p successful PTCA/DES x 1 proximal and mid LAD 5. Severe stenosis mid obtuse marginal. Now s/p successful PTCA/DES x 1 OM1.   Recommendations: He will need DAPT with ASA and Plavix for at least one month. Will start statin. Continue beta blocker.   Patient Profile     84 y.o. male  with a hx of CAD s/p DES to LAD/OM! 01/2016, ischemic cardiomyopathy, chronic systolic heart failure, HTN, HLD, CVA, and PVCs by holter on BB  who is being seen today  for the evaluation of palpitations at the request of Dr. Cruzita Lederer.  Assessment & Plan    1. Chronic systolic heart failure with ischemic cardiomyopathy: Stable.  2. CAD s/p DES to proximal -mid LAD, DES to OM1 01/23/16: She denies any chest pain.  He is on aspirin and Plavix for stroke.  3. Embolic stroke: TEE and possibly implantable loop recorder today    4. Hx of bradycardia and PVC, bigeminy: Patient is asymptomatic.  Continue carvedilol.  5. HTN: Pressure is well controlled.  6. Acute renal insufficiency: Creatinine appears to be stable.     Mertie Moores, MD  01/26/2018 11:18 AM    [place],  [place], [place]  [address] Pager [phone] Phone: [phone])  [phone]; Fax: [phone]

## 2018-01-26 NOTE — Discharge Summary (Signed)
Physician Discharge Summary  Patrick Maxwell GEX:[id number] DOB: 1931/08/13 DOA: 01/20/2018  PCP: [name], MD  Admit date: 01/20/2018 Discharge date: 01/26/2018  Admitted From: home Disposition:  CIR  Recommendations for Outpatient Follow-up:  1. Follow up with PCP in 1-2 weeks 2. Discharge to [place]: none Equipment/Devices: none  Discharge Condition: stable CODE STATUS: Full code Diet recommendation: heart healthy  HPI: Per Patrick Mayhew, PA Patrick Maxwell is a 84 y.o. male with a history of CAD with last cardiac catheterization May 2017, ischemic cardiomyopathy with chronic systolic heart failure with with last echo in November 13, 2017 showing ejection fraction 35 to 40%, history of bradycardia, history of PVCs PACs low at heart care, Dr. Serita Sheller, hypertension, hyperlipidemia last LDL in February 2019 at 114, history of CVA 10 years ago, prior to the emergency department in for evaluation of the onset of vertigo, and left-sided tingling.  Patient noticed symptoms around 8 PM, and experiencing a sudden onset of vertigo, described as room spinning, and he was unable to maintain his balance, crawling out of his bed to come to the door to call out for help from his wife.  The patient did not take an aspirin at the time. The tingling was described as left-sided going from the left arm, through the left flank area to the hip and to the left leg.  The patient denies any nausea or vomiting.  No loss of consciousness or presyncope.  He denies any headaches.  He denies any vision changes except at the initial symptoms, his wife reports that he was looking to his right, but no seizure was evident.  He denies any dysphagia.  He had some mild dysarthria prior to presentation, but this has improved to his baseline residual from his prior stroke.  From report, the patient was ataxic, but apparently during his hospitalization, tingling symptoms on the left and his vertigo, have  resolved.  He denies any chest pain or palpitations, he denies any shortness of breath or cough.  He denies any abdominal pain, dysuria, lower extremity swelling or calf pain.  He denies any fever, chills, recent illnesses.  He denies any recent infections or sick contacts.  He denies any recent hospitalizations, or long distance trips.  He takes an aspirin a day.  He denies any tobacco, alcohol or recreational drug use.  He denies a history of benign positional vertigo, or Mnire's disease.  On presentation no TPA was given, as the patient is outside the window, drinking scale is 1   Hospital Course: Acute CVA -patient was admitted to the hospital with concerns for stroke. Neurology consulted and followed patient while hospitalized.  MRI on admission showed acute left posterior inferior cerebral artery territory infarct. CT angiogram negative for large vessel occlusion, the left PICA is partially thrombosed beginning at its origin. 2D echo completed on 5/2 showed an EF of 30 to 35%, diffuse hypokinesis. This was followed by a contrasted echo without evidence of a cardiac thrombus, and ultimately patient underwent a TEE. This was positive for "smoke" as outlined below. Neurology discussed with Cardiology and recommendations are at this point to start patient on anticoagulation with Eliquis and continue baby aspirin. LDL 97, continue statin. A1c 5.7. Patient to be discharged to CIR. History of premature ventricular contractions -followed by cardiology as an outpatient, on Coreg.  Chronic combined systolic and diastolic CHF -Appears euvolemic, troponin negative, continue home medications Coronary artery disease/ischemic cardiomyopathy -No chest pain, continue aspirin Chronic  kidney disease stage III -Creatinine remains at baseline Hypertension -resume home medications Hyperlipidemia -High-dose statin as above BPH -Continue Flomax   Discharge Diagnoses:  Principal Problem:   Acute CVA (cerebrovascular  accident) ([place]) Active Problems:   History of CVA (cerebrovascular accident) without residual deficits   Hypertension, benign   Hypercholesteremia   Subclinical hypothyroidism   Palpitations   Acute combined systolic and diastolic congestive heart failure (HCC)   PVC's (premature ventricular contractions)   Coronary artery disease involving native coronary artery of native heart with unstable angina pectoris ([place])   BPH with elevated PSA   Bradycardia   Cerebellar infarct ([place])   Leukocytosis   Prediabetes   Discharge Instructions  Allergies as of 01/26/2018   No Known Allergies     Medication List    TAKE these medications   apixaban 5 MG Tabs tablet Commonly known as:  ELIQUIS Take 1 tablet (5 mg total) by mouth 2 (two) times daily.   aspirin 81 MG tablet Take 81 mg by mouth daily.   atorvastatin 40 MG tablet Commonly known as:  LIPITOR Take 1 tablet (40 mg total) by mouth daily at 6 PM. What changed:    medication strength  how much to take  when to take this   benzonatate 100 MG capsule Commonly known as:  TESSALON Take 1 capsule (100 mg total) by mouth 3 (three) times daily as needed for cough.   carvedilol 3.125 MG tablet Commonly known as:  COREG Take 1 tablet (3.125 mg total) by mouth 2 (two) times daily.   CoQ10 100 MG Caps Take 1 capsule by mouth daily.   furosemide 20 MG tablet Commonly known as:  LASIX TAKE ONE TABLET BY MOUTH ONCE DAILY   losartan 25 MG tablet Commonly known as:  COZAAR Take 0.5 tablets (12.5 mg total) by mouth daily.   Magnesium 100 MG Tabs Take 1 tablet by mouth 2 (two) times daily.   multivitamin with minerals Tabs tablet Take 1 tablet by mouth daily.   nitroGLYCERIN 0.4 MG SL tablet Commonly known as:  NITROSTAT Place 1 tablet (0.4 mg total) under the tongue every 5 (five) minutes as needed.   Potassium 99 MG Tabs Take 99 mg by mouth daily.   tamsulosin 0.4 MG Caps capsule Commonly known as:  FLOMAX Take 1  capsule (0.4 mg total) by mouth daily.   VITAMIN C PO Take 250 mg by mouth 3 (three) times a week.   VITAMIN D PO Take 5,000 Units by mouth daily.   vitamin E 400 UNIT capsule Take 400 Units by mouth 3 (three) times a week.      Consultations:  Cardiology   Neurology   Procedures/Studies:  2D echo  Impressions: - Compared to a prior study in 10/2017, the LVEF is lower at 30-35%. Cannot exclude apical thrombus - recommend limited Definity contrast study to evaluate.  Limited Echo with Definity Study Conclusions - Left ventricle: The cavity size was normal. Systolic function was severely reduced. The estimated ejection fraction was in the range of 20% to 25%. Diffuse hypokinesis. Acoustic contrast opacification revealed no evidence ofthrombus.  - Right ventricle: The cavity size was mildly dilated. Wall thickness was normal. Systolic function was mildly reduced. - Right atrium: The atrium was mildly dilated.  TEE LEFT VENTRICLE: EF = 20-25%. Dilated. Severe global HK. No apical clot RIGHT VENTRICLE: Moderately to severe global HK LEFT ATRIUM: Markedly dilated.  LA diameter +6.6cm. + smoke  LEFT ATRIAL APPENDAGE: No thrombus.  RIGHT ATRIUM: Normal AORTIC VALVE:  [place]. Mildly calcified. Trivial AI MITRAL VALVE:    Normal. Mild to moderate central MR TRICUSPID VALVE: Normal. Moderate to severe TR. RVSP ~80mmHG PULMONIC VALVE: Grossly normal. Mild PI.  INTERATRIAL SEPTUM: Borderline ASA. No PFO or ASD. Bubble study negative PERICARDIUM: No effusion DESCENDING AORTA: Severe plaque.  Frequent PVCs and bigeminy. 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   Ct Angio Head W Or Wo Contrast  Result Date: 01/21/2018 CLINICAL DATA:  84 year old male with vertigo and acute left PICA territory infarct affecting left cerebellum on MRI earlier today. EXAM: CT ANGIOGRAPHY HEAD AND NECK TECHNIQUE: Multidetector CT imaging of the head and neck was performed using the standard protocol during  bolus administration of intravenous contrast. Multiplanar CT image reconstructions and MIPs were obtained to evaluate the vascular anatomy. Carotid stenosis measurements (when applicable) are obtained utilizing NASCET criteria, using the distal internal carotid diameter as the denominator. CONTRAST:  76mL ISOVUE-370 IOPAMIDOL (ISOVUE-370) INJECTION 76% COMPARISON:  Brain MRI 0459 hours today. FINDINGS: CT HEAD Brain: Confluent hypodensity in the medial inferior left cerebellum corresponding to the area of diffusion abnormality on MRI earlier today (series 5, image 6). No associated hemorrhage. No posterior fossa mass effect. The 4th ventricle is patent. Hypodensity in the supratentorial brain especially the left deep gray matter nuclei corresponds to chronic MRI signal today. No midline shift, ventriculomegaly, mass effect, evidence of mass lesion, intracranial hemorrhage. Calvarium and skull base: No acute osseous abnormality identified. Paranasal sinuses: Clear. Orbits: Negative orbit and scalp soft tissues. CTA NECK Skeleton: No acute osseous abnormality identified. Cervical spine degeneration. Incidental right maxillary sinus alveolar recess mucous retention cysts. Upper chest: Questionable trace layering right pleural effusion. Questionable mild centrilobular emphysema in the upper lungs. There is mild dependent atelectasis, and also mild apical septal thickening bilaterally. No superior mediastinal lymphadenopathy. Other neck: Negative.  No neck mass or lymphadenopathy. Aortic arch: Moderate Calcified aortic atherosclerosis. 3 vessel arch configuration. Right carotid system: No brachiocephalic or right CCA origin stenosis despite some soft plaque. Mild proximal right CCA tortuosity. Mild calcified and soft plaque at the right carotid bifurcation with no cervical right ICA stenosis. Left carotid system: No left CCA stenosis despite some plaque. Tortuous proximal left CCA. Minimal plaque at the left carotid  bifurcation. Mild soft and calcified plaque in the distal left ICA bulb. No cervical left ICA stenosis. Vertebral arteries: No proximal right subclavian artery stenosis despite mild plaque. Normal right vertebral artery origin. Tortuous right V1 segment. Patent right vertebral artery to the skull base without stenosis. No proximal left subclavian artery stenosis despite soft and calcified plaque. The left vertebral artery origin is normal (series 9 image 141). Mild to moderate tortuosity of the left V1 segment. Tortuous proximal left V2 segment. The left vertebral artery is patent to the skull base without stenosis. CTA HEAD Posterior circulation: The left vertebral V4 segment is patent but demonstrates areas of mild to moderate irregularity suspicious for atherosclerosis. No hemodynamically significant distal left vertebral stenosis results. The left PICA origin appears partially thrombosed on series 11, image 147, but there is some preserved PICA enhancement. There is mild to moderate irregularity also in the right vertebral artery V4 segment compatible with atherosclerosis, no significant stenosis. Patent right PICA origin. Patent vertebrobasilar junction. Patent basilar artery with mild irregularity, no stenosis. Normal SCA and PCA origins. Posterior communicating arteries are diminutive or absent. Mild bilateral PCA irregularity compatible with atherosclerosis. Preserved distal PCA enhancement. Anterior circulation: Patent left ICA siphon with mild to  moderate calcified plaque beginning in the cavernous segment. There is only mild distal left cavernous and proximal supraclinoid left ICA stenosis. Normal left ophthalmic artery origin. The right ICA siphon is patent with similar moderate calcified plaque. There is mild right cavernous but moderate proximal right supraclinoid ICA stenosis (series 11, image 88 and series 13 image 66). Normal right ophthalmic artery origin. The bilateral carotid termini are patent.  MCA and ACA origins are patent with mild irregularity. Mild ACA A1 segment irregularity. Anterior communicating artery is normal. There is moderate distal left A2 segment irregularity and stenosis (series 16, image 21). Other ACA branches are normal to mildly irregular. The left MCA M1 segment is mildly irregular. The left MCA bifurcation is patent. There is mild anterior M2 and moderate to severe anterior M3 segment irregularity with stenoses (series 16, image 28). Mild left MCA branch irregularity elsewhere. Right MCA M1 segment is mildly irregular and bifurcates early. There is up to moderate stenosis at the right MCA bifurcation best seen on series 16, image 15. Right MCA branches are mildly irregular. Venous sinuses: Patent. Anatomic variants: None. Delayed phase: No abnormal enhancement identified. The boundary of cytotoxic edema in the inferior left cerebellum is more apparent following contrast. Review of the MIP images confirms the above findings IMPRESSION: 1. Negative for large vessel occlusion. 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 2. There is little extracranial atherosclerosis, with no cervical carotid or vertebral artery stenosis. But there is moderate generalized intracranial atherosclerosis. Mostly mild intracranial arterial stenoses are noted - including in both vertebral artery V4 segments - with the following exceptions: - moderate right ICA siphon supraclinoid stenosis due to calcified plaque. - moderate right MCA bifurcation stenosis. - moderate left ACA A2 segment stenosis. - moderate and severe left MCA anterior division M3 stenoses. 3. Stable appearance of the acute left PICA infarct from the MRI earlier today. Cytotoxic edema with no hemorrhage or mass effect at this time. 4. Aortic Atherosclerosis (ICD10-I70.0). Electronically Signed   By: Genevie Ann M.D.   On: 01/21/2018 07:56   Ct Angio Neck W Or Wo Contrast  Result Date: 01/21/2018 CLINICAL DATA:  84 year old  male with vertigo and acute left PICA territory infarct affecting left cerebellum on MRI earlier today. EXAM: CT ANGIOGRAPHY HEAD AND NECK TECHNIQUE: Multidetector CT imaging of the head and neck was performed using the standard protocol during bolus administration of intravenous contrast. Multiplanar CT image reconstructions and MIPs were obtained to evaluate the vascular anatomy. Carotid stenosis measurements (when applicable) are obtained utilizing NASCET criteria, using the distal internal carotid diameter as the denominator. CONTRAST:  36mL ISOVUE-370 IOPAMIDOL (ISOVUE-370) INJECTION 76% COMPARISON:  Brain MRI 0459 hours today. FINDINGS: CT HEAD Brain: Confluent hypodensity in the medial inferior left cerebellum corresponding to the area of diffusion abnormality on MRI earlier today (series 5, image 6). No associated hemorrhage. No posterior fossa mass effect. The 4th ventricle is patent. Hypodensity in the supratentorial brain especially the left deep gray matter nuclei corresponds to chronic MRI signal today. No midline shift, ventriculomegaly, mass effect, evidence of mass lesion, intracranial hemorrhage. Calvarium and skull base: No acute osseous abnormality identified. Paranasal sinuses: Clear. Orbits: Negative orbit and scalp soft tissues. CTA NECK Skeleton: No acute osseous abnormality identified. Cervical spine degeneration. Incidental right maxillary sinus alveolar recess mucous retention cysts. Upper chest: Questionable trace layering right pleural effusion. Questionable mild centrilobular emphysema in the upper lungs. There is mild dependent atelectasis, and also mild apical septal thickening bilaterally.  No superior mediastinal lymphadenopathy. Other neck: Negative.  No neck mass or lymphadenopathy. Aortic arch: Moderate Calcified aortic atherosclerosis. 3 vessel arch configuration. Right carotid system: No brachiocephalic or right CCA origin stenosis despite some soft plaque. Mild proximal right  CCA tortuosity. Mild calcified and soft plaque at the right carotid bifurcation with no cervical right ICA stenosis. Left carotid system: No left CCA stenosis despite some plaque. Tortuous proximal left CCA. Minimal plaque at the left carotid bifurcation. Mild soft and calcified plaque in the distal left ICA bulb. No cervical left ICA stenosis. Vertebral arteries: No proximal right subclavian artery stenosis despite mild plaque. Normal right vertebral artery origin. Tortuous right V1 segment. Patent right vertebral artery to the skull base without stenosis. No proximal left subclavian artery stenosis despite soft and calcified plaque. The left vertebral artery origin is normal (series 9 image 141). Mild to moderate tortuosity of the left V1 segment. Tortuous proximal left V2 segment. The left vertebral artery is patent to the skull base without stenosis. CTA HEAD Posterior circulation: The left vertebral V4 segment is patent but demonstrates areas of mild to moderate irregularity suspicious for atherosclerosis. No hemodynamically significant distal left vertebral stenosis results. The left PICA origin appears partially thrombosed on series 11, image 147, but there is some preserved PICA enhancement. There is mild to moderate irregularity also in the right vertebral artery V4 segment compatible with atherosclerosis, no significant stenosis. Patent right PICA origin. Patent vertebrobasilar junction. Patent basilar artery with mild irregularity, no stenosis. Normal SCA and PCA origins. Posterior communicating arteries are diminutive or absent. Mild bilateral PCA irregularity compatible with atherosclerosis. Preserved distal PCA enhancement. Anterior circulation: Patent left ICA siphon with mild to moderate calcified plaque beginning in the cavernous segment. There is only mild distal left cavernous and proximal supraclinoid left ICA stenosis. Normal left ophthalmic artery origin. The right ICA siphon is patent with  similar moderate calcified plaque. There is mild right cavernous but moderate proximal right supraclinoid ICA stenosis (series 11, image 88 and series 13 image 66). Normal right ophthalmic artery origin. The bilateral carotid termini are patent. MCA and ACA origins are patent with mild irregularity. Mild ACA A1 segment irregularity. Anterior communicating artery is normal. There is moderate distal left A2 segment irregularity and stenosis (series 16, image 21). Other ACA branches are normal to mildly irregular. The left MCA M1 segment is mildly irregular. The left MCA bifurcation is patent. There is mild anterior M2 and moderate to severe anterior M3 segment irregularity with stenoses (series 16, image 28). Mild left MCA branch irregularity elsewhere. Right MCA M1 segment is mildly irregular and bifurcates early. There is up to moderate stenosis at the right MCA bifurcation best seen on series 16, image 15. Right MCA branches are mildly irregular. Venous sinuses: Patent. Anatomic variants: None. Delayed phase: No abnormal enhancement identified. The boundary of cytotoxic edema in the inferior left cerebellum is more apparent following contrast. Review of the MIP images confirms the above findings IMPRESSION: 1. Negative for large vessel occlusion. 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 2. There is little extracranial atherosclerosis, with no cervical carotid or vertebral artery stenosis. But there is moderate generalized intracranial atherosclerosis. Mostly mild intracranial arterial stenoses are noted - including in both vertebral artery V4 segments - with the following exceptions: - moderate right ICA siphon supraclinoid stenosis due to calcified plaque. - moderate right MCA bifurcation stenosis. - moderate left ACA A2 segment stenosis. - moderate and severe left MCA anterior division  M3 stenoses. 3. Stable appearance of the acute left PICA infarct from the MRI earlier today. Cytotoxic  edema with no hemorrhage or mass effect at this time. 4. Aortic Atherosclerosis (ICD10-I70.0). Electronically Signed   By: Genevie Ann M.D.   On: 01/21/2018 07:56   Mr Brain Wo Contrast  Result Date: 01/21/2018 CLINICAL DATA:  Persistent central vertigo.  Near syncope. EXAM: MRI HEAD WITHOUT CONTRAST TECHNIQUE: Multiplanar, multiecho pulse sequences of the brain and surrounding structures were obtained without intravenous contrast. COMPARISON:  None. FINDINGS: BRAIN: The midline structures are normal. Abnormal diffusion restriction within the inferior left cerebellar hemisphere, in the distribution of the posterior inferior cerebellar artery. No mass effect or acute hemorrhage. Fourth ventricle remains widely patent. Old left basal ganglia and corona radiata lacunar infarcts. Mild periventricular white matter hyperintensity. No age-advanced or lobar predominant atrophy. There is blooming on susceptibility weighted imaging in the area of the left posterior inferior cerebellar artery, which may indicate thrombus. VASCULAR: Major intracranial arterial and venous sinus flow voids are preserved. SKULL AND UPPER CERVICAL SPINE: The visualized skull base, calvarium, upper cervical spine and extracranial soft tissues are normal. SINUSES/ORBITS: Small maxillary retention cysts. No mastoid or middle ear effusion. Normal orbits. IMPRESSION: 1. Acute left posterior inferior cerebellar artery territory infarct without hemorrhage or mass effect 2. Chronic small vessel disease with old left basal ganglia and corona radiata lacunar infarcts. Electronically Signed   By: Ulyses Jarred M.D.   On: 01/21/2018 05:32   Dg Chest Portable 1 View  Result Date: 01/21/2018 CLINICAL DATA:  Dyspnea and hypoxia. EXAM: PORTABLE CHEST 1 VIEW COMPARISON:  12/07/2017 FINDINGS: Cardiomegaly with aortic atherosclerosis. Mild central vascular congestion superimposed on chronic interstitial lung disease. No effusion or pulmonary consolidation. No  pneumothorax. Degenerative changes are seen about both shoulders and dorsal spine. IMPRESSION: Cardiomegaly with aortic atherosclerosis.  Mild CHF. Electronically Signed   By: Ashley Royalty M.D.   On: 01/21/2018 01:48     Subjective: - no chest pain, shortness of breath, no abdominal pain, nausea or vomiting.   Discharge Exam: Vitals:   01/26/18 1318 01/26/18 1417  BP: (!) 149/78 126/71  Pulse: (!) 45 (!) 42  Resp: (!) 21 20  Temp:  97.9 F (36.6 C)  SpO2: 98% 99%    General: Pt is alert, awake, not in acute distress Cardiovascular: RRR with extra beats, S1/S2 +, no rubs, no gallops Respiratory: CTA bilaterally, no wheezing, no rhonchi Abdominal: Soft, NT, ND, bowel sounds + Extremities: no edema, no cyanosis    The results of significant diagnostics from this hospitalization (including imaging, microbiology, ancillary and laboratory) are listed below for reference.     Microbiology: No results found for this or any previous visit (from the past 240 hour(s)).   Labs: BNP (last 3 results) Recent Labs    01/21/18 0137  BNP 2,025.4*   Basic Metabolic Panel: Recent Labs  Lab 01/20/18 2324 01/21/18 0030 01/24/18 0300 01/26/18 0542  NA 139  --  139 141  K 4.1  --  4.1 4.1  CL 108  --  100* 106  CO2 21*  --  30 27  GLUCOSE 215*  --  107* 102*  BUN 22*  --  14 19  CREATININE 1.29*  --  1.34* 1.25*  CALCIUM 8.9  --  9.0 8.7*  MG  --  1.9  --   --    Liver Function Tests: No results for input(s): AST, ALT, ALKPHOS, BILITOT, PROT, ALBUMIN in the last  168 hours. No results for input(s): LIPASE, AMYLASE in the last 168 hours. No results for input(s): AMMONIA in the last 168 hours. CBC: Recent Labs  Lab 01/20/18 2324 01/24/18 0300 01/26/18 0542  WBC 11.6* 6.9 6.6  HGB 14.5 14.3 14.4  HCT 43.4 43.3 41.9  MCV 86.8 86.3 85.5  PLT 139* 129* 125*   Cardiac Enzymes: No results for input(s): CKTOTAL, CKMB, CKMBINDEX, TROPONINI in the last 168 hours. BNP: Invalid  input(s): POCBNP CBG: Recent Labs  Lab 01/20/18 2318  GLUCAP 170*   D-Dimer No results for input(s): DDIMER in the last 72 hours. Hgb A1c No results for input(s): HGBA1C in the last 72 hours. Lipid Profile No results for input(s): CHOL, HDL, LDLCALC, TRIG, CHOLHDL, LDLDIRECT in the last 72 hours. Thyroid function studies No results for input(s): TSH, T4TOTAL, T3FREE, THYROIDAB in the last 72 hours.  Invalid input(s): FREET3 Anemia work up No results for input(s): VITAMINB12, FOLATE, FERRITIN, TIBC, IRON, RETICCTPCT in the last 72 hours. Urinalysis    Component Value Date/Time   COLORURINE YELLOW 01/20/2018 2311   APPEARANCEUR HAZY (A) 01/20/2018 2311   LABSPEC 1.019 01/20/2018 2311   PHURINE 5.0 01/20/2018 2311   GLUCOSEU NEGATIVE 01/20/2018 2311   HGBUR SMALL (A) 01/20/2018 2311   BILIRUBINUR NEGATIVE 01/20/2018 2311   KETONESUR NEGATIVE 01/20/2018 2311   PROTEINUR 30 (A) 01/20/2018 2311   UROBILINOGEN 1.0 10/06/2009 1406   NITRITE NEGATIVE 01/20/2018 2311   LEUKOCYTESUR NEGATIVE 01/20/2018 2311   Sepsis Labs Invalid input(s): PROCALCITONIN,  WBC,  LACTICIDVEN   Time coordinating discharge: 45 minutes  SIGNED:  Marzetta Board, MD  [facility] 01/26/2018, 3:00 PM Pager [phone]  If 7PM-7AM, please contact night-coverage [email or web address] Password [email or web address]

## 2018-01-26 NOTE — Progress Notes (Signed)
  Echocardiogram Echocardiogram Transesophageal has been performed.  Patrick Maxwell 01/26/2018, 1:32 PM

## 2018-01-26 NOTE — Discharge Instructions (Signed)

## 2018-01-26 NOTE — Interval H&P Note (Signed)
History and Physical Interval Note:  01/26/2018 12:10 PM  Patrick Maxwell  has presented today for surgery, with the diagnosis of STROKE  The various methods of treatment have been discussed with the patient and family. After consideration of risks, benefits and other options for treatment, the patient has consented to  Procedure(s): TRANSESOPHAGEAL ECHOCARDIOGRAM (TEE) (N/A) as a surgical intervention .  The patient's history has been reviewed, patient examined, no change in status, stable for surgery.  I have reviewed the patient's chart and labs.  Questions were answered to the patient's satisfaction.     Ambre Kobayashi

## 2018-01-26 NOTE — Consult Note (Addendum)
ELECTROPHYSIOLOGY CONSULT NOTE  Patient ID: Patrick Maxwell MRN: [id number], DOB/AGE: 05-11-1931   Admit date: 01/20/2018 Date of Consult: 01/26/2018  Primary Physician: [name], MD Primary Cardiologist: Dr. Angelena Form Reason for Consultation: Cryptogenic stroke -; recommendations regarding Implantable Loop Recorder, requested by Dr. Erlinda Hong  History of Present Illness [name] was admitted on 01/20/2018 with acute onset ataxia/vertigo found with new CVA.   Cardiology was also called to the case 2/2 palpitations, frequent PVCs.  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   Imaging demonstrated acute left posterior inferior cerebellar artery territory infarct.  he has undergone workup for stroke including echocardiogram which could not r/o possible LV thrombus and carotid angio.  The patient has been monitored on telemetry which has demonstrated sinus rhythm, very frequent PVCs (multifocal), infrequent NSVT  with no AF.  Inpatient stroke work-up is to be completed with a TEE.   Echo 01/21/18: Study Conclusions - Left ventricle: The cavity size was moderately dilated. Wall thickness was normal. Systolic function was moderately to severely reduced. The estimated ejection fraction was in the range of 30% to 35%. Diffuse hypokinesis. Cannot exclude apical thrombus on non-contrast image. The study is not technically sufficient to allow evaluation of LV diastolic function. - Aortic valve: Trileaflet. Sclerosis without stenosis. There was trivial regurgitation. - Mitral valve: Mildly thickened leaflets . There was moderate regurgitation. - Left atrium: The atrium was mildly dilated. - Right ventricle: The cavity size was mildly dilated. - Tricuspid valve: There was mild regurgitation. - Pulmonary arteries: PA peak pressure: 59 mm Hg (S). - Inferior vena cava: The vessel was dilated. The  respirophasic diameter changes were blunted (<50%), consistent with elevated central venous pressure. Impressions: - Compared to a prior study in 10/2017, the LVEF is lower at 30-35%. Cannot exclude apical thrombus - recommend limited Definity contrast study to evaluate.  Lab work is reviewed.  Prior to admission, the patient denies chest pain, shortness of breath, dizziness, palpitations, or syncope.  They are recovering from their stroke with plans to CIR at discharge.   Past Medical History:  Diagnosis Date  . CAD (coronary artery disease)   . CHF (congestive heart failure) ([place])   . Frequent unifocal PVCs    Palpitations since early 20s, PVCs and pairs  . Hyperlipidemia   . Hypertension   . Stroke [facility]) 2009     Surgical History:  Past Surgical History:  Procedure Laterality Date  . CARDIAC CATHETERIZATION N/A 01/23/2016   Procedure: Right/Left Heart Cath and Coronary Angiography;  Surgeon: Burnell Blanks, MD;  Location: [place] [facility];  Service: Cardiovascular;  Laterality: N/A;  . CARDIAC CATHETERIZATION N/A 01/23/2016   Procedure: Coronary Stent Intervention;  Surgeon: Burnell Blanks, MD;  Location: [place] [facility];  Service: Cardiovascular;  Laterality: N/A;  . HERNIA REPAIR  06/1992     Medications Prior to Admission  Medication Sig Dispense Refill Last Dose  . Ascorbic Acid (VITAMIN C PO) Take 250 mg by mouth 3 (three) times a week.    Past Week at Unknown time  . aspirin 81 MG tablet Take 81 mg by mouth daily.    01/20/2018 at Unknown time  . carvedilol (COREG) 3.125 MG tablet Take 1 tablet (3.125 mg total) by mouth 2 (two) times daily. 180 tablet 3 01/20/2018 at 1800  . Cholecalciferol (VITAMIN D PO) Take 5,000 Units by mouth daily.  01/20/2018 at Unknown time  . Coenzyme Q10 (COQ10) 100 MG CAPS Take 1 capsule by mouth daily.   01/20/2018 at Unknown time  . furosemide (LASIX) 20 MG tablet TAKE ONE TABLET BY MOUTH ONCE DAILY 90 tablet 1 01/20/2018  at Unknown time  . losartan (COZAAR) 25 MG tablet Take 0.5 tablets (12.5 mg total) by mouth daily. 90 tablet 3 01/20/2018 at Unknown time  . Magnesium 100 MG TABS Take 1 tablet by mouth 2 (two) times daily.   01/20/2018 at Unknown time  . Multiple Vitamin (MULTIVITAMIN WITH MINERALS) TABS tablet Take 1 tablet by mouth daily.   01/20/2018 at Unknown time  . nitroGLYCERIN (NITROSTAT) 0.4 MG SL tablet Place 1 tablet (0.4 mg total) under the tongue every 5 (five) minutes as needed. 25 tablet 3 unknown  . Potassium 99 MG TABS Take 99 mg by mouth daily.    01/20/2018 at Unknown time  . tamsulosin (FLOMAX) 0.4 MG CAPS capsule Take 1 capsule (0.4 mg total) by mouth daily. 30 capsule 3 01/20/2018 at Unknown time  . vitamin E 400 UNIT capsule Take 400 Units by mouth 3 (three) times a week.   Past Week at Unknown time  . atorvastatin (LIPITOR) 20 MG tablet Take 1 tablet (20 mg total) by mouth daily. (Patient not taking: Reported on 01/21/2018) 90 tablet 3 Not Taking at Unknown time  . benzonatate (TESSALON) 100 MG capsule Take 1 capsule (100 mg total) by mouth 3 (three) times daily as needed for cough. (Patient not taking: Reported on 01/21/2018) 20 capsule 0 Completed Course at Unknown time    Inpatient Medications:  . [MAR Hold] aspirin EC  81 mg Oral Daily  . [MAR Hold] atorvastatin  40 mg Oral q1800  . [MAR Hold] carvedilol  3.125 mg Oral BID  . [MAR Hold] cholecalciferol  5,000 Units Oral Daily  . [MAR Hold] clopidogrel  75 mg Oral Daily  . [MAR Hold] heparin  5,000 Units Subcutaneous Q8H  . [MAR Hold] polyethylene glycol  17 g Oral Daily  . [MAR Hold] senna-docusate  2 tablet Oral Once  . [MAR Hold] tamsulosin  0.4 mg Oral Daily    Allergies: No Known Allergies  Social History   Socioeconomic History  . Marital status: Married    Spouse name: Not on file  . Number of children: Not on file  . Years of education: Not on file  . Highest education level: Not on file  Occupational History  . Not on file   Social Needs  . Financial resource strain: Not on file  . Food insecurity:    Worry: Not on file    Inability: Not on file  . Transportation needs:    Medical: Not on file    Non-medical: Not on file  Tobacco Use  . Smoking status: Never Smoker  . Smokeless tobacco: Never Used  Substance and Sexual Activity  . Alcohol use: No  . Drug use: No  . Sexual activity: Not Currently  Lifestyle  . Physical activity:    Days per week: Not on file    Minutes per session: Not on file  . Stress: Not on file  Relationships  . Social connections:    Talks on phone: Not on file    Gets together: Not on file    Attends religious service: Not on file    Active member of club or organization: Not on file    Attends meetings of clubs or organizations: Not on file  Relationship status: Not on file  . Intimate partner violence:    Fear of current or ex partner: Not on file    Emotionally abused: Not on file    Physically abused: Not on file    Forced sexual activity: Not on file  Other Topics Concern  . Not on file  Social History Narrative  . Not on file     Family History  Problem Relation Age of Onset  . Stroke Mother   . Early death Mother   . Cancer Brother       Review of Systems: All other systems reviewed and are otherwise negative except as noted above.  Physical Exam: Vitals:   01/26/18 0321 01/26/18 0451 01/26/18 0825 01/26/18 1115  BP: 138/73  140/75 (!) 156/85  Pulse: (!) 43  (!) 40 86  Resp: 16  16 20   Temp:   97.7 F (36.5 C) 97.7 F (36.5 C)  TempSrc:   Oral Oral  SpO2: 95%  99% 99%  Weight:  194 lb 0.1 oz (88 kg)  194 lb (88 kg)  Height:    5\' 11"  (1.803 m)    GEN- The patient is well appearing, alert and oriented x 3 today.   Head- normocephalic, atraumatic Eyes-  Sclera clear, conjunctiva pink Ears- hearing intact Oropharynx- clear Neck- supple Lungs- CTA b/l, normal work of breathing Heart- RRR, extrasystoles appreciated, no murmurs, rubs or  gallops  GI- soft, NT, ND Extremities- no clubbing, cyanosis, or edema MS- no significant deformity or atrophy Skin- no rash or lesion Psych- euthymic mood, full affect   Labs:   Lab Results  Component Value Date   WBC 6.6 01/26/2018   HGB 14.4 01/26/2018   HCT 41.9 01/26/2018   MCV 85.5 01/26/2018   PLT 125 (L) 01/26/2018    Recent Labs  Lab 01/26/18 0542  NA 141  K 4.1  CL 106  CO2 27  BUN 19  CREATININE 1.25*  CALCIUM 8.7*  GLUCOSE 102*   Lab Results  Component Value Date   TROPONINI <0.03 01/22/2016   Lab Results  Component Value Date   CHOL 151 01/22/2018   CHOL 178 11/09/2017   CHOL 151 12/30/2016   Lab Results  Component Value Date   HDL 41 01/22/2018   HDL 45 11/09/2017   HDL 45 12/30/2016   Lab Results  Component Value Date   LDLCALC 97 01/22/2018   LDLCALC 114 (H) 11/09/2017   LDLCALC 86 12/30/2016   Lab Results  Component Value Date   TRIG 64 01/22/2018   TRIG 90 11/09/2017   TRIG 98 12/30/2016   Lab Results  Component Value Date   CHOLHDL 3.7 01/22/2018   CHOLHDL 4.0 11/09/2017   CHOLHDL 3.4 12/30/2016   No results found for: LDLDIRECT  No results found for: DDIMER   Radiology/Studies:   Ct Angio Head W Or Wo Contrast Result Date: 01/21/2018 CLINICAL DATA:  84 year old male with vertigo and acute left PICA territory infarct affecting left cerebellum on MRI earlier today. EXAM: CT ANGIOGRAPHY HEAD AND NECK TECHNIQUE: Multidetector CT imaging of the head and neck was performed using the standard protocol during bolus administration of intravenous contrast. Multiplanar CT image reconstructions and MIPs were obtained to evaluate the vascular anatomy. Carotid stenosis measurements (when applicable) are obtained utilizing NASCET criteria, using the distal internal carotid diameter as the denominator. CONTRAST:  36mL ISOVUE-370 IOPAMIDOL (ISOVUE-370) INJECTION 76% COMPARISON:  Brain MRI 0459 hours today. FINDINGS: CT HEAD Brain: Confluent  hypodensity  in the medial inferior left cerebellum corresponding to the area of diffusion abnormality on MRI earlier today (series 5, image 6). No associated hemorrhage. No posterior fossa mass effect. The 4th ventricle is patent. Hypodensity in the supratentorial brain especially the left deep gray matter nuclei corresponds to chronic MRI signal today. No midline shift, ventriculomegaly, mass effect, evidence of mass lesion, intracranial hemorrhage. Calvarium and skull base: No acute osseous abnormality identified. Paranasal sinuses: Clear. Orbits: Negative orbit and scalp soft tissues. CTA NECK Skeleton: No acute osseous abnormality identified. Cervical spine degeneration. Incidental right maxillary sinus alveolar recess mucous retention cysts. Upper chest: Questionable trace layering right pleural effusion. Questionable mild centrilobular emphysema in the upper lungs. There is mild dependent atelectasis, and also mild apical septal thickening bilaterally. No superior mediastinal lymphadenopathy. Other neck: Negative.  No neck mass or lymphadenopathy. Aortic arch: Moderate Calcified aortic atherosclerosis. 3 vessel arch configuration. Right carotid system: No brachiocephalic or right CCA origin stenosis despite some soft plaque. Mild proximal right CCA tortuosity. Mild calcified and soft plaque at the right carotid bifurcation with no cervical right ICA stenosis. Left carotid system: No left CCA stenosis despite some plaque. Tortuous proximal left CCA. Minimal plaque at the left carotid bifurcation. Mild soft and calcified plaque in the distal left ICA bulb. No cervical left ICA stenosis. Vertebral arteries: No proximal right subclavian artery stenosis despite mild plaque. Normal right vertebral artery origin. Tortuous right V1 segment. Patent right vertebral artery to the skull base without stenosis. No proximal left subclavian artery stenosis despite soft and calcified plaque. The left vertebral artery origin is  normal (series 9 image 141). Mild to moderate tortuosity of the left V1 segment. Tortuous proximal left V2 segment. The left vertebral artery is patent to the skull base without stenosis. CTA HEAD Posterior circulation: The left vertebral V4 segment is patent but demonstrates areas of mild to moderate irregularity suspicious for atherosclerosis. No hemodynamically significant distal left vertebral stenosis results. The left PICA origin appears partially thrombosed on series 11, image 147, but there is some preserved PICA enhancement. There is mild to moderate irregularity also in the right vertebral artery V4 segment compatible with atherosclerosis, no significant stenosis. Patent right PICA origin. Patent vertebrobasilar junction. Patent basilar artery with mild irregularity, no stenosis. Normal SCA and PCA origins. Posterior communicating arteries are diminutive or absent. Mild bilateral PCA irregularity compatible with atherosclerosis. Preserved distal PCA enhancement. Anterior circulation: Patent left ICA siphon with mild to moderate calcified plaque beginning in the cavernous segment. There is only mild distal left cavernous and proximal supraclinoid left ICA stenosis. Normal left ophthalmic artery origin. The right ICA siphon is patent with similar moderate calcified plaque. There is mild right cavernous but moderate proximal right supraclinoid ICA stenosis (series 11, image 88 and series 13 image 66). Normal right ophthalmic artery origin. The bilateral carotid termini are patent. MCA and ACA origins are patent with mild irregularity. Mild ACA A1 segment irregularity. Anterior communicating artery is normal. There is moderate distal left A2 segment irregularity and stenosis (series 16, image 21). Other ACA branches are normal to mildly irregular. The left MCA M1 segment is mildly irregular. The left MCA bifurcation is patent. There is mild anterior M2 and moderate to severe anterior M3 segment irregularity  with stenoses (series 16, image 28). Mild left MCA branch irregularity elsewhere. Right MCA M1 segment is mildly irregular and bifurcates early. There is up to moderate stenosis at the right MCA bifurcation best seen on series 16, image 15. Right  MCA branches are mildly irregular. Venous sinuses: Patent. Anatomic variants: None. Delayed phase: No abnormal enhancement identified. The boundary of cytotoxic edema in the inferior left cerebellum is more apparent following contrast. Review of the MIP images confirms the above findings IMPRESSION: 1. Negative for large vessel occlusion. 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 2. There is little extracranial atherosclerosis, with no cervical carotid or vertebral artery stenosis. But there is moderate generalized intracranial atherosclerosis. Mostly mild intracranial arterial stenoses are noted - including in both vertebral artery V4 segments - with the following exceptions: - moderate right ICA siphon supraclinoid stenosis due to calcified plaque. - moderate right MCA bifurcation stenosis. - moderate left ACA A2 segment stenosis. - moderate and severe left MCA anterior division M3 stenoses. 3. Stable appearance of the acute left PICA infarct from the MRI earlier today. Cytotoxic edema with no hemorrhage or mass effect at this time. 4. Aortic Atherosclerosis (ICD10-I70.0). Electronically Signed   By: Genevie Ann M.D.   On: 01/21/2018 07:56     Mr Brain Wo Contrast Result Date: 01/21/2018 CLINICAL DATA:  Persistent central vertigo.  Near syncope. EXAM: MRI HEAD WITHOUT CONTRAST TECHNIQUE: Multiplanar, multiecho pulse sequences of the brain and surrounding structures were obtained without intravenous contrast. COMPARISON:  None. FINDINGS: BRAIN: The midline structures are normal. Abnormal diffusion restriction within the inferior left cerebellar hemisphere, in the distribution of the posterior inferior cerebellar artery. No mass effect or acute  hemorrhage. Fourth ventricle remains widely patent. Old left basal ganglia and corona radiata lacunar infarcts. Mild periventricular white matter hyperintensity. No age-advanced or lobar predominant atrophy. There is blooming on susceptibility weighted imaging in the area of the left posterior inferior cerebellar artery, which may indicate thrombus. VASCULAR: Major intracranial arterial and venous sinus flow voids are preserved. SKULL AND UPPER CERVICAL SPINE: The visualized skull base, calvarium, upper cervical spine and extracranial soft tissues are normal. SINUSES/ORBITS: Small maxillary retention cysts. No mastoid or middle ear effusion. Normal orbits. IMPRESSION: 1. Acute left posterior inferior cerebellar artery territory infarct without hemorrhage or mass effect 2. Chronic small vessel disease with old left basal ganglia and corona radiata lacunar infarcts. Electronically Signed   By: Ulyses Jarred M.D.   On: 01/21/2018 05:32     Dg Chest Portable 1 View Result Date: 01/21/2018 CLINICAL DATA:  Dyspnea and hypoxia. EXAM: PORTABLE CHEST 1 VIEW COMPARISON:  12/07/2017 FINDINGS: Cardiomegaly with aortic atherosclerosis. Mild central vascular congestion superimposed on chronic interstitial lung disease. No effusion or pulmonary consolidation. No pneumothorax. Degenerative changes are seen about both shoulders and dorsal spine. IMPRESSION: Cardiomegaly with aortic atherosclerosis.  Mild CHF. Electronically Signed   By: Ashley Royalty M.D.   On: 01/21/2018 01:48    12-lead ECG SR, all with frequent PVCs, multifocal All prior EKG's in EPIC reviewed with no documented atrial fibrillation  Telemetry SR, frequent multifocal PVCs, infrequent NSVT (longest 7 beats, monomorphic)  Assessment and Plan:  1. Cryptogenic stroke The patient presents with cryptogenic stroke.  The patient has a TEE planned for this AM.  Dr. Curt Bears saw and examined patient, spoke at length with the patient about monitoring for afib  with either a 30 day event monitor or an implantable loop recorder.  Risks, benefits, and alteratives to implantable loop recorder were discussed with the patient today.   At this time, the patient is very clear in their decision to proceed with implantable loop recorder.   Wound care was reviewed with the patient (keep incision clean and  dry for 3 days).  Wound check Yesenia Fontenette be scheduled for the patient  Please call with questions.   Baldwin Jamaica, PA-C 01/26/2018  I have seen and examined this patient with Tommye Standard.  Agree with above, note added to reflect my findings.  On exam, RRR, no murmurs, lungs clear.  Patient initially presented to the hospital with cryptogenic stroke.  His symptoms are greatly improved.  Because shows a significantly reduced ejection fraction, and he has quite frequent PVCs.  At this time, would hold off on link monitor.  Neurology feels that stroke with severely reduced LV function warrants anticoagulation.  We Raneshia Derick arrange follow-up in EP clinic with a 48-hour monitor pre-visit to get a more accurate estimation of his PVC burden.  Dewanda Fennema M. Raul Winterhalter MD 01/26/2018 3:19 PM

## 2018-01-26 NOTE — Progress Notes (Signed)
STROKE TEAM - DAILY PROGRESS NOTE    SUBJECTIVE: Pt wife at bedside. Pt awake alert and lying in bed comfortably. Had TTE with contrast yesterday showed EF 20-25% but no LV thrombus. Had TEE today showed again EF 20-25% and no LV thrombus. Discussed with pt and wife and Dr. Cathie Olden that given low EF and embolic stroke, will start anticoagulation for stroke prevention. Loop recorder cancelled at this time.   OBJECTIVE Most recent Vital Signs: Vitals:   01/26/18 1245 01/26/18 1248 01/26/18 1256 01/26/18 1318  BP: (!) 149/88 (!) 153/86 124/89 (!) 149/78  Pulse: (!) 47 (!) 42 90 (!) 45  Resp: (!) 25 (!) 21 18 (!) 21  Temp:   97.8 F (36.6 C)   TempSrc:   Oral   SpO2: 100% 100%  98%  Weight:      Height:       CBG (last 3)  No results for input(s): GLUCAP in the last 72 hours.  Physical Exam  HEENT-  Normocephalic, no lesions, without obvious abnormality.  Normal external eye and conjunctiva.   Cardiovascular- Bradycardic rhythm, S1-S2 audible, pulses palpable throughout   Lungs-no rhonchi or wheezing noted, no excessive working breathing.  Abdomen- All 4 quadrants palpated and nontender Musculoskeletal-no joint tenderness Skin-warm and dry  Neuro:  Mental Status: Alert, oriented, thought content appropriate.  Dysarthria slightly detected, but as he goes on in conversation it improves and becomes clear.  Able to follow 3 step commands without difficulty. Cranial Nerves: II:  Visual fields grossly normal,  III,IV, VI: ptosis not present, extra-ocular motions intact bilaterally pupils equal, round, reactive to light  V,VII: smile symmetric, facial light touch sensation normal bilaterally VIII: hearing intact to voice IX,X: uvula rises symmetrically XI: bilateral shoulder shrug XII: midline tongue extension Motor: Right : Upper extremity   5/5    Left:     Upper extremity   5/5  Lower extremity   5/5     Lower extremity    5/5 Tone and bulk:normal tone throughout; no atrophy noted Sensory: admits to decreased sensation in left fingers Deep Tendon Reflexes: 2+ and symmetric throughout Plantars: Right: downgoing   Left: downgoing Cerebellar: Pt with trunkal ataxia; normal finger-to-nose, slightly off with left heal to shin Gait:Defered  Exam unchanged today.   MEDICATIONS  . aspirin EC  81 mg Oral Daily  . atorvastatin  40 mg Oral q1800  . carvedilol  3.125 mg Oral BID  . cholecalciferol  5,000 Units Oral Daily  . clopidogrel  75 mg Oral Daily  . heparin  5,000 Units Subcutaneous Q8H  . polyethylene glycol  17 g Oral Daily  . senna-docusate  2 tablet Oral Once  . tamsulosin  0.4 mg Oral Daily   PRN:  acetaminophen **OR** acetaminophen (TYLENOL) oral liquid 160 mg/5 mL **OR** acetaminophen, hydrALAZINE, meclizine, senna-docusate  Diet:   Diet Order           Diet NPO time specified  Diet effective now          CLINICALLY SIGNIFICANT STUDIES Basic Metabolic Panel:  Recent Labs  Lab 01/21/18 0030 01/24/18 0300 01/26/18 0542  NA  --  139 141  K  --  4.1 4.1  CL  --  100* 106  CO2  --  30 27  GLUCOSE  --  107* 102*  BUN  --  14 19  CREATININE  --  1.34* 1.25*  CALCIUM  --  9.0 8.7*  MG 1.9  --   --    CBC:  Recent Labs  Lab 01/24/18 0300 01/26/18 0542  WBC 6.9 6.6  HGB 14.3 14.4  HCT 43.3 41.9  MCV 86.3 85.5  PLT 129* 125*   CoaUrinalysis:  Recent Labs  Lab 01/20/18 2311  COLORURINE YELLOW  LABSPEC 1.019  PHURINE 5.0  GLUCOSEU NEGATIVE  HGBUR SMALL*  BILIRUBINUR NEGATIVE  KETONESUR NEGATIVE  PROTEINUR 30*  NITRITE NEGATIVE  LEUKOCYTESUR NEGATIVE   Lipid Panel    Component Value Date/Time   CHOL 151 01/22/2018 0720   TRIG 64 01/22/2018 0720   HDL 41 01/22/2018 0720   CHOLHDL 3.7 01/22/2018 0720   VLDL 13 01/22/2018 0720   LDLCALC 97 01/22/2018 0720   LDLCALC 114 (H) 11/09/2017 0841   HgbA1C  Lab Results  Component Value Date   HGBA1C 5.7 (H) 01/22/2018     I have personally reviewed the radiological images below and agree with the radiology interpretations.  CT Angio Head/Neck W Or Wo Contrast   01/21/2018 IMPRESSION:  1. Negative for large vessel occlusion. 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  2. There is little extracranial atherosclerosis, with no cervical carotid or vertebral artery stenosis. But there is moderate generalized intracranial atherosclerosis. Mostly mild intracranial arterial stenoses are noted - including in both vertebral artery V4 segments - with the following exceptions: - moderate right ICA siphon supraclinoid stenosis due to calcified plaque. - moderate right MCA bifurcation stenosis. - moderate left ACA A2 segment stenosis. - moderate and severe left MCA anterior division M3 stenoses.  3. Stable appearance of the acute left PICA infarct from the MRI earlier today. Cytotoxic edema with no hemorrhage or mass effect at this time.  4. Aortic Atherosclerosis (ICD10-I70.0).   MR Brain Wo Contrast  01/21/2018 IMPRESSION: 1. Acute left posterior inferior cerebellar artery territory infarct without hemorrhage or mass effect  2. Chronic small vessel disease with old left basal ganglia and corona radiata lacunar infarcts.   Dg Chest Portable 1 View: 01/21/2018 IMPRESSION:  Cardiomegaly with aortic atherosclerosis.  Mild CHF.  2D Echocardiogram 01/21/18 Compared to a prior study in 10/2017, the LVEF is lower at 30-35%.   Cannot exclude apical thrombus - recommend limited Definity   contrast study to evaluate.  EKG: SR with bigeminy.   TTE with contrast  - Left ventricle: The cavity size was normal. Systolic function was   severely reduced. The estimated ejection fraction was in the   range of 20% to 25%. Diffuse hypokinesis. Acoustic contrast   opacification revealed no evidence ofthrombus. - Right ventricle: The cavity size was mildly dilated. Wall   thickness was normal. Systolic function was mildly  reduced. - Right atrium: The atrium was mildly dilated.  TEE  LEFT VENTRICLE: EF = 20-25%. Dilated. Severe global HK. No apical clot  RIGHT VENTRICLE: Moderately to severe global HK  LEFT ATRIUM: Markedly dilated.  LA diameter +6.6cm. + smoke   LEFT ATRIAL APPENDAGE: No thrombus.   RIGHT ATRIUM: Normal  AORTIC VALVE:  Trileaflet. Mildly calcified. Trivial AI  MITRAL VALVE:    Normal. Mild to moderate central MR  TRICUSPID VALVE: Normal. Moderate to severe TR. RVSP ~25mmHG  PULMONIC VALVE: Grossly normal. Mild  PI.   INTERATRIAL SEPTUM: Borderline ASA. No PFO or ASD. Bubble study negative  PERICARDIUM: No effusion  DESCENDING AORTA: Severe plaque.   Frequent PVCs and bigeminy. 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    ASSESSMENT 84 year old male with history of CAD, CHF, HTN, HLD, PVCs, and stroke admitted for vertigo, gait difficulty, and ataxia.   He had a stroke about 10 years ago with word finding difficulties.  He was in [place] at the time and admitted to the hospital and was told to have a small left temporal stroke.  His speech resolved in 6 months.  This time he was admitted for vertigo, not able to walk, with ataxia.  MRI showed left PICA infarct, and old left BG/CR infarct.  CTA head and neck left PICA occlusion with diffuse intracranial stenosis including right cavernous SCA and right M1.  EF 30 to 35%, down from 35 to 40% in 10/2017.  He also had a 48-hour Holter monitoring PTA showed frequent PVCs which he was put on Coreg.  A1c 5.7 and LDL 87 this time.  His stroke could be due to PICA occlusion due to atherosclerosis or cardioembolic given low EF. Had cardiology consultation, repeat 2D echo with contrast showed EF 20-25% but no LV thrombus. Had TEE again showed EF 20-25% with global HK but no LV or LA thrombus.    Given the low EF and embolic stroke, will consider anticoagulation for stroke prevention. Discussed with Dr. Cathie Olden, pt and his wife, will start  eliquis 5mg  bid and continue ASA 81mg  given hx of CAD. He will need to continue to follow up with cardiology. Recommend to repeat TTE in 3 months, if EF > 30-35%, eliquis can be discontinued.   Neurology will sign off. Please call with questions. Pt will follow up with stroke clinic NP at [facility] in about 4 weeks. Thanks for the consult.  Rosalin Hawking, MD PhD Stroke Neurology 01/26/2018 1:43 PM      To contact Stroke Continuity provider, please refer to [email or web address] After hours, contact General Neurology

## 2018-01-26 NOTE — H&P (Signed)
Physical Medicine and Rehabilitation Admission H&P       Chief Complaint  Patient presents with  . Near Syncope  : HPI: Patrick Maxwell is an 84 year old right-handed male with history of CVA 2009 maintained on aspirin, hypertension, chronic systolic congestive heart failure, CKD stage II, hyperlipidemia, CAD, bradycardia with PVCs.  Per chart review patient lives with spouse.  Independent prior to admission.  One level home with 4 steps to entry.  Presented 01/21/2018 with vertigo and tingling on his left side.  He was noted to be ataxic.  He presented to the ED.  MRI showed left cerebellar infarction.  Per report, acute left posterior inferior cerebellar artery infarction with chronic small vessel disease as well as old left basal ganglia and corona radiata infarcts.  CT angiogram negative for large vessel occlusion, left PICA partial thrombosis.  Echocardiogram with ejection fraction of 30 to 35% with diffuse hypokinesis.  Neurology follow-up currently maintained on aspirin as well as the addition of Plavix for CVA prophylaxis x3 weeks then Plavix alone.  A repeat echocardiogram was completed 01/25/2018 to rule out apical thrombus that was negative/no cardiac source of emboli identified.  There was noted diffuse hypokinesis ejection fraction was 25%.  Subcutaneous heparin for DVT prophylaxis.  TEE completed 01/26/2018 showing no thrombus, ejection fraction 20 to 25%.  Severe global hypokinesis.  No plan for loop recorder placement .  Tolerating a regular diet.  Physical and occupational therapy evaluations completed with recommendations of physical medicine rehab consult.  Patient was admitted for a comprehensive rehab program.  Review of Systems  Constitutional: Negative for chills and fever.  HENT: Negative for hearing loss.   Eyes: Negative for blurred vision and double vision.  Respiratory: Negative for cough and shortness of breath.   Cardiovascular: Positive for palpitations and leg  swelling.  Gastrointestinal: Positive for constipation. Negative for nausea and vomiting.  Genitourinary: Positive for urgency. Negative for dysuria, flank pain and hematuria.  Musculoskeletal: Positive for myalgias.  Skin: Negative for rash.  Neurological: Positive for tingling and weakness.  All other systems reviewed and are negative.      Past Medical History:  Diagnosis Date  . CAD (coronary artery disease)   . CHF (congestive heart failure) ([place])   . Frequent unifocal PVCs    Palpitations since early 20s, PVCs and pairs  . Hyperlipidemia   . Hypertension   . Stroke [facility]) 2009        Past Surgical History:  Procedure Laterality Date  . CARDIAC CATHETERIZATION N/A 01/23/2016   Procedure: Right/Left Heart Cath and Coronary Angiography;  Surgeon: Burnell Blanks, MD;  Location: [facility];  Service: Cardiovascular;  Laterality: N/A;  . CARDIAC CATHETERIZATION N/A 01/23/2016   Procedure: Coronary Stent Intervention;  Surgeon: Burnell Blanks, MD;  Location: [facility];  Service: Cardiovascular;  Laterality: N/A;  . HERNIA REPAIR  06/1992        Family History  Problem Relation Age of Onset  . Stroke Mother   . Early death Mother   . Cancer Brother    Social History:  reports that he has never smoked. He has never used smokeless tobacco. He reports that he does not drink alcohol or use drugs. Allergies: No Known Allergies       Medications Prior to Admission  Medication Sig Dispense Refill  . Ascorbic Acid (VITAMIN C PO) Take 250 mg by mouth 3 (three) times a week.     Marland Kitchen aspirin  81 MG tablet Take 81 mg by mouth daily.     . carvedilol (COREG) 3.125 MG tablet Take 1 tablet (3.125 mg total) by mouth 2 (two) times daily. 180 tablet 3  . Cholecalciferol (VITAMIN D PO) Take 5,000 Units by mouth daily.    . Coenzyme Q10 (COQ10) 100 MG CAPS Take 1 capsule by mouth daily.    . furosemide (LASIX) 20 MG tablet TAKE ONE TABLET BY  MOUTH ONCE DAILY 90 tablet 1  . losartan (COZAAR) 25 MG tablet Take 0.5 tablets (12.5 mg total) by mouth daily. 90 tablet 3  . Magnesium 100 MG TABS Take 1 tablet by mouth 2 (two) times daily.    . Multiple Vitamin (MULTIVITAMIN WITH MINERALS) TABS tablet Take 1 tablet by mouth daily.    . nitroGLYCERIN (NITROSTAT) 0.4 MG SL tablet Place 1 tablet (0.4 mg total) under the tongue every 5 (five) minutes as needed. 25 tablet 3  . Potassium 99 MG TABS Take 99 mg by mouth daily.     . tamsulosin (FLOMAX) 0.4 MG CAPS capsule Take 1 capsule (0.4 mg total) by mouth daily. 30 capsule 3  . vitamin E 400 UNIT capsule Take 400 Units by mouth 3 (three) times a week.    . atorvastatin (LIPITOR) 20 MG tablet Take 1 tablet (20 mg total) by mouth daily. (Patient not taking: Reported on 01/21/2018) 90 tablet 3  . benzonatate (TESSALON) 100 MG capsule Take 1 capsule (100 mg total) by mouth 3 (three) times daily as needed for cough. (Patient not taking: Reported on 01/21/2018) 20 capsule 0    Drug Regimen Review Drug regimen was reviewed and remains appropriate with no significant issues identified  Home: Home Living Family/patient expects to be discharged to:: Private residence Living Arrangements: Spouse/significant other Available Help at Discharge: Family Type of Home: House Home Access: Stairs to enter Entrance Stairs-Number of Steps: 4 Entrance Stairs-Rails: Right Home Layout: One level Bathroom Toilet: Handicapped height Home Equipment: None   Functional History: Prior Function Level of Independence: Independent  Functional Status:  Mobility: Bed Mobility Overal bed mobility: Needs Assistance Bed Mobility: Supine to Sit, Sit to Supine Supine to sit: Supervision Sit to supine: Supervision General bed mobility comments: Given visual target during mobility Transfers Overall transfer level: Needs assistance Equipment used: 1 person hand held assist Transfers: Sit to/from Stand, Stand  Pivot Transfers Sit to Stand: Mod assist(L bias) Stand pivot transfers: Mod assist General transfer comment: Mod assist to prevent anterior and left LOB when coming to stand, the thrusted forward and needed cues to find midline.  Ambulation/Gait Ambulation/Gait assistance: Mod assist, +2 physical assistance Ambulation Distance (Feet): 20 Feet Assistive device: 2 person hand held assist Gait Pattern/deviations: Step-through pattern, Ataxic, Staggering left, Scissoring General Gait Details: Pt with very narrow BOS, near scissoring, left anterior lateral lean.  Cues to wide base during gait.  Some awareness of where he is leaning, just difficult to correct right now, dizziness with gait, nausea bag brought with us.   Gait velocity interpretation: <1.8 ft/sec, indicate of risk for recurrent falls  ADL: ADL Overall ADL's : Needs assistance/impaired Eating/Feeding: Sitting, Minimal assistance Grooming: Standing, Minimal assistance Upper Body Bathing: Set up, Supervision/ safety, Sitting Lower Body Bathing: Moderate assistance, Sit to/from stand Upper Body Dressing : Minimal assistance, Sitting Lower Body Dressing: Moderate assistance, Sit to/from stand Toilet Transfer: Moderate assistance, Ambulation Toilet Transfer Details (indicate cue type and reason): ambulated to sink with therapist waslking on pt's left side with pt's L   arm over shoulder to facilitaet step through pattern adn weight shift Toileting- Clothing Manipulation and Hygiene: Moderate assistance Toileting - Clothing Manipulation Details (indicate cue type and reason): sit to stand noted.  ataxia noted with sit to stand transition as well as standing Functional mobility during ADLs: Moderate assistance General ADL Comments: Facilitaed control of movement patterns during ADL tasks. Pt with good ability to use feedback to modify movement.  Cognition: Cognition Overall Cognitive Status: Within Functional Limits for tasks  assessed Orientation Level: Oriented X4 Cognition Arousal/Alertness: Awake/alert Behavior During Therapy: WFL for tasks assessed/performed Overall Cognitive Status: Within Functional Limits for tasks assessed  Physical Exam: Blood pressure 112/62, pulse (!) 40, temperature (!) 97.5 F (36.4 C), temperature source Oral, resp. rate 14, height 5' 11" (1.803 m), weight 87.1 kg (192 lb 0.3 oz), SpO2 97 %. Physical Exam  Vitals reviewed. Constitutional: He is oriented to person, place, and time. He appears well-developed and well-nourished.  HENT:  Head: Normocephalic and atraumatic.  Eyes: Pupils are equal, round, and reactive to light. Conjunctivae are normal.  Neck: No thyromegaly present.  Cardiovascular: Normal rate and regular rhythm. Exam reveals no friction rub.  No murmur heard. Respiratory: Effort normal. No respiratory distress. He has no wheezes. He has no rales.  GI: Soft. Bowel sounds are normal. He exhibits no distension. There is no tenderness.  Musculoskeletal: He exhibits edema.  Neurological: He is alert and oriented to person, place, and time. He has normal reflexes.  Normal insight and awareness. Subtle limb ataxia LUE. Sensation 2/2 bilateral. Strength 4 to 4+/5 bilaterally prox to distal. Subtle left sided weakness.  Skin:  Skin is intact warm and dry  Psychiatric: He has a normal mood and affect. His behavior is normal.    LabResultsLast48Hours        Results for orders placed or performed during the hospital encounter of 01/20/18 (from the past 48 hour(s))  CBC     Status: Abnormal   Collection Time: 01/24/18  3:00 AM  Result Value Ref Range   WBC 6.9 4.0 - 10.5 K/uL   RBC 5.02 4.22 - 5.81 MIL/uL   Hemoglobin 14.3 13.0 - 17.0 g/dL   HCT 43.3 39.0 - 52.0 %   MCV 86.3 78.0 - 100.0 fL   MCH 28.5 26.0 - 34.0 pg   MCHC 33.0 30.0 - 36.0 g/dL   RDW 15.5 11.5 - 15.5 %   Platelets 129 (L) 150 - 400 K/uL    Comment: Performed at [facility], [address] [place], [place] [address]  Basic metabolic panel     Status: Abnormal   Collection Time: 01/24/18  3:00 AM  Result Value Ref Range   Sodium 139 135 - 145 mmol/L   Potassium 4.1 3.5 - 5.1 mmol/L   Chloride 100 (L) 101 - 111 mmol/L   CO2 30 22 - 32 mmol/L   Glucose, Bld 107 (H) 65 - 99 mg/dL   BUN 14 6 - 20 mg/dL   Creatinine, Ser 1.34 (H) 0.61 - 1.24 mg/dL   Calcium 9.0 8.9 - 10.3 mg/dL   GFR calc non Af Amer 47 (L) >60 mL/min   GFR calc Af Amer 54 (L) >60 mL/min    Comment: (NOTE) The eGFR has been calculated using the CKD EPI equation. This calculation has not been validated in all clinical situations. eGFR's persistently <60 mL/min signify possible Chronic Kidney Disease.    Anion gap 9 5 - 15    Comment: Performed   at [facility], [place] [address], [place], [place] [address]     ImagingResults(Last48hours)  No results found.       Medical Problem List and Plan: 1.  Left hemiparesis with truncal ataxia secondary to acute left posterior inferior cerebellar artery infarction             -admit to inpatient rehab 2.  DVT Prophylaxis/Anticoagulation: Subcutaneous heparin.  Monitor for any bleeding episodes 3. Pain Management: Tylenol as needed 4. Mood: Provide emotional support 5. Neuropsych: This patient is capable of making decisions on his own behalf. 6. Skin/Wound Care: Routine skin checks 7. Fluids/Electrolytes/Nutrition: Routine in and outs with follow-up chemistries 8.  Chronic systolic congestive heart failure.  Monitor for any signs of fluid overload 9.  Hypertension.  Coreg 3.125 mg twice daily 10.  History of bradycardia and PVC.  Patient asymptomatic.  Continue Coreg.  Follow cardiology services as needed 11.  CKD stage II.  Follow-up chemistries 12.  Hyperlipidemia.  Lipitor 13.  BPH.  Flomax 0.4 mg daily.  Check PVR x3 14.  Constipation.  Laxative assistance  Post Admission Physician  Evaluation: 1. Functional deficits secondary  to left cerebellar infarct. 2. Patient is admitted to receive collaborative, interdisciplinary care between the physiatrist, rehab nursing staff, and therapy team. 3. Patient's level of medical complexity and substantial therapy needs in context of that medical necessity cannot be provided at a lesser intensity of care such as a SNF. 4. Patient has experienced substantial functional loss from his/her baseline which was documented above under the "Functional History" and "Functional Status" headings.  Judging by the patient's diagnosis, physical exam, and functional history, the patient has potential for functional progress which will result in measurable gains while on inpatient rehab.  These gains will be of substantial and practical use upon discharge  in facilitating mobility and self-care at the household level. 5. Physiatrist will provide 24 hour management of medical needs as well as oversight of the therapy plan/treatment and provide guidance as appropriate regarding the interaction of the two. 6. The Preadmission Screening has been reviewed and patient status is unchanged unless otherwise stated above. 7. 24 hour rehab nursing will assist with bladder management, bowel management, safety, skin/wound care, disease management, medication administration and patient education  and help integrate therapy concepts, techniques,education, etc. 8. PT will assess and treat for/with: Lower extremity strength, range of motion, stamina, balance, functional mobility, safety, adaptive techniques and equipment, NMR, vesitbular rx/assessment.   Goals are: supervision . 9. OT will assess and treat for/with: ADL's, functional mobility, safety, upper extremity strength, adaptive techniques and equipment, NMR, vestibular rx, family ed.   Goals are: supervision to min assist. Therapy may proceed with showering this patient. 10. SLP will assess and treat for/with: n/a.  Goals  are: n/a. 11. Case Management and Social Worker will assess and treat for psychological issues and discharge planning. 12. Team conference will be held weekly to assess progress toward goals and to determine barriers to discharge. 13. Patient will receive at least 3 hours of therapy per day at least 5 days per week. 14. ELOS: 12-17 days       15. Prognosis:  excellent    I have personally performed a face to face diagnostic evaluation of this patient and formulated the key components of the plan.  Additionally, I have personally reviewed laboratory data, imaging studies, as well as relevant notes and concur with the physician assistant's documentation above.  Meredith Staggers, MD, Mellody Drown  Daniel J Angiulli, PA-C 01/25/2018      

## 2018-01-26 NOTE — Progress Notes (Signed)
Patient ID: Patrick Maxwell, male   DOB: [birth year], 84 y.o.   MRN: [id number] Patient arrived from 3W24 with RN, family and patient belongings. Patient oriented to rehab process, schedule, fall prevention plan, safety plan, and health resource notebook. Patient resting comfortably in bed with call bell at side, bed alarm on, and family in room. Patient has no complaints of pain.

## 2018-01-26 NOTE — Progress Notes (Signed)
Transport arrived to transport patient for [employer], consent obtained  And signed by wife per patients request before leaving unit. CIR to talk to family about auth approval and transferring upstairs today. Patient returning to unit and staff informed Loop recorder would be placed at bedside.

## 2018-01-26 NOTE — Progress Notes (Signed)
Patient being transfer to CIR report given to [facility]

## 2018-01-26 NOTE — Progress Notes (Signed)
I have insurance approval and bed to admit pt to today after LOOP placement. I met with pt and his wife at bedside and they are in agreement. RN CM made aware. I will make the arrangement to admit today. [phone]

## 2018-01-26 NOTE — Progress Notes (Signed)
Cristina Gong, RN  Rehab Admission Coordinator  Physical Medicine and Rehabilitation  PMR Pre-admission  Signed  Date of Service:  01/26/2018 3:21 PM       Related encounter: ED to Hosp-Admission (Current) from 01/20/2018 in [facility]      Signed           Show:Clear all [x] Manual[x] Template[x] Copied  Added by: [x] Julious Payer Vertis Kelch, RN   [] Hover for details   PMR Admission Coordinator Pre-Admission Assessment  Patient: Patrick Maxwell is an 84 y.o., male MRN: [id number] DOB: Oct 27, 1930 Height: 5\' 11"  (180.3 cm) Weight: 88 kg (194 lb)                                                                                                                                                  Insurance Information HMO:     PPO: yes     PCP:      IPA:      80/20:      OTHER: medicare advantage plan PRIMARY: [employer]      Policy#: [id number]      Subscriber: pt CM Name: Patrick Maxwell      Phone#: [phone]   Fax#: Epic access Pre-Cert#: [id number] cert for 7 days      Employer: retired Benefits:  Phone #: [phone]     Name: 01/26/2018 Eff. Date: 09/22/2017     Deduct: none      Out of Pocket Max: $3400      Life Max: none CIR: $295 co pay per day days 1 until 6      SNF: $20 co pay per day days 1 to 6; $160 co pay per day days 7 to 90 Outpatient: $15 co pay per visit     Co-Pay: visits per medical neccesity Home Health: 100%      Co-Pay: visits per medical neccesity DME: 80%     Co-Pay: 20% Providers: in network  SECONDARY: none        Medicaid Application Date:       Case Manager:  Disability Application Date:       Case Worker:   Emergency Publishing copy Information    Name Relation Home Work Lindsay [phone]  [phone]     Current Medical History  Patient Admitting Diagnosis:  Left CVA  History of Present Illness: Patrick Maxwell an 84 year old right-handed male with  history of CVA 2009 maintained on aspirin, hypertension, chronic systolic congestive heart failure, CKD stage II, hyperlipidemia, CAD, bradycardia with PVCs. Per chart review patient lives with spouse. Independent prior to admission. One level home with 4 steps to entry. Presented 01/21/2018 with vertigo and tingling on his left side. He was noted to be ataxic. He presented to the ED. MRI  showed left cerebellar infarction. Per report, acute left posterior inferior cerebellar artery infarction with chronic small vessel disease as well as old left basal ganglia and corona radiata infarcts. CT angiogram negative for large vessel occlusion, left PICA partial thrombosis. Echocardiogram with ejection fraction of 30 to 35% with diffuse hypokinesis. Neurology follow-up currently maintained on aspirin as well as the addition of Plavix for CVA prophylaxis x3 weeks then Plavix alone. A repeat echocardiogram was completed 01/25/2018 to rule out apical thrombus that was negative/no cardiac source of emboli identified. There was noted diffuse hypokinesis ejection fraction was 25%. Subcutaneousheparin for DVT prophylaxis. TEE completed 01/26/2018 showing no thrombus, ejection fraction 20 to 25%. Severe global hypokinesis. No plan forloop recorder placement .Tolerating a regular diet.   Total: 0 NIHSS  Past Medical History      Past Medical History:  Diagnosis Date  . CAD (coronary artery disease)   . CHF (congestive heart failure) ([place])   . Frequent unifocal PVCs    Palpitations since early 20s, PVCs and pairs  . Hyperlipidemia   . Hypertension   . Stroke [facility]) 2009    Family History  family history includes Cancer in his brother; Early death in his mother; Stroke in his mother.  Prior Rehab/Hospitalizations:  Has the patient had major surgery during 100 days prior to admission? No  Current Medications   Current Facility-Administered Medications:  .  acetaminophen (TYLENOL)  tablet 650 mg, 650 mg, Oral, Q4H PRN **OR** acetaminophen (TYLENOL) solution 650 mg, 650 mg, Per Tube, Q4H PRN **OR** acetaminophen (TYLENOL) suppository 650 mg, 650 mg, Rectal, Q4H PRN, Shawn Route, Sara E, PA-C .  apixaban (ELIQUIS) tablet 5 mg, 5 mg, Oral, BID, Gherghe, Costin M, MD .  aspirin EC tablet 81 mg, 81 mg, Oral, Daily, [place], Pearl, NP, 81 mg at 01/25/18 0928 .  atorvastatin (LIPITOR) tablet 40 mg, 40 mg, Oral, q1800, Jacob Moores, NP, Stopped at 01/25/18 1858 .  carvedilol (COREG) tablet 3.125 mg, 3.125 mg, Oral, BID, Rondel Jumbo, PA-C, Stopped at 01/25/18 2305 .  cholecalciferol (VITAMIN D) tablet 5,000 Units, 5,000 Units, Oral, Daily, Rondel Jumbo, PA-C, 5,000 Units at 01/25/18 4401 .  hydrALAZINE (APRESOLINE) injection 5 mg, 5 mg, Intravenous, Q8H PRN, Rondel Jumbo, PA-C, 5 mg at 01/21/18 0936 .  meclizine (ANTIVERT) tablet 25 mg, 25 mg, Oral, TID PRN, Rondel Jumbo, PA-C, 25 mg at 01/21/18 0935 .  polyethylene glycol (MIRALAX / GLYCOLAX) packet 17 g, 17 g, Oral, Daily, Caren Griffins, MD, 17 g at 01/25/18 0928 .  senna-docusate (Senokot-S) tablet 1 tablet, 1 tablet, Oral, QHS PRN, Shawn Route, Sara E, PA-C .  senna-docusate (Senokot-S) tablet 2 tablet, 2 tablet, Oral, Once, Gherghe, Costin M, MD .  tamsulosin (FLOMAX) capsule 0.4 mg, 0.4 mg, Oral, Daily, Rondel Jumbo, PA-C, 0.4 mg at 01/25/18 0272  Patients Current Diet:       Diet Order           Diet Heart Room service appropriate? Yes; Fluid consistency: Thin  Diet effective now          Precautions / Restrictions Precautions Precautions: Fall Precaution Comments: left sided ataxia and left lateral lean Restrictions Weight Bearing Restrictions: No   Has the patient had 2 or more falls or a fall with injury in the past year?No  Prior Activity Level Community (5-7x/wk): independent without AD; retired Airline pilot, likes to garden  Development worker, international aid / Paramedic  Devices/Equipment: [employer]: None  Prior Device  Use: Indicate devices/aids used by the patient prior to current illness, exacerbation or injury? None of the above  Prior Functional Level Prior Function Level of Independence: Independent  Self Care: Did the patient need help bathing, dressing, using the toilet or eating?  Independent  Indoor Mobility: Did the patient need assistance with walking from room to room (with or without device)? Independent  Stairs: Did the patient need assistance with internal or external stairs (with or without device)? Independent  Functional Cognition: Did the patient need help planning regular tasks such as shopping or remembering to take medications? Independent  Current Functional Level Cognition  Overall Cognitive Status: Within Functional Limits for tasks assessed Orientation Level: Oriented X4    Extremity Assessment (includes Sensation/Coordination)  Upper Extremity Assessment: Defer to OT evaluation LUE Deficits / Details: decreased coordination noted LUE with finger nose finger activity as well as fxal task  Lower Extremity Assessment: LLE deficits/detail LLE Deficits / Details: left leg with 4+/5 strength throughout, but incoordinated with gross functional movements like gait.   LLE Coordination: decreased gross motor    ADLs  Overall ADL's : Needs assistance/impaired Eating/Feeding: Sitting, Minimal assistance Grooming: Standing, Minimal assistance Upper Body Bathing: Set up, Supervision/ safety, Sitting Lower Body Bathing: Moderate assistance, Sit to/from stand Upper Body Dressing : Minimal assistance, Sitting Lower Body Dressing: Moderate assistance, Sit to/from stand Toilet Transfer: Moderate assistance, Ambulation Toilet Transfer Details (indicate cue type and reason): ambulated to sink with therapist waslking on pt's left side with pt's L arm over shoulder to facilitaet step through pattern adn weight  shift Toileting- Clothing Manipulation and Hygiene: Moderate assistance Toileting - Clothing Manipulation Details (indicate cue type and reason): sit to stand noted.  ataxia noted with sit to stand transition as well as standing Functional mobility during ADLs: Moderate assistance General ADL Comments: Facilitaed control of movement patterns during ADL tasks. Pt with good ability to use feedback to modify movement.    Mobility  Overal bed mobility: Needs Assistance Bed Mobility: Supine to Sit, Sit to Supine Supine to sit: Supervision Sit to supine: Supervision General bed mobility comments: supervision for safety; c/o dizziness upon sitting     Transfers  Overall transfer level: Needs assistance Equipment used: Rolling walker (2 wheeled) Transfers: Sit to/from Stand Sit to Stand: Min assist Stand pivot transfers: Mod assist General transfer comment: assist to balance and cues for safe hand placement    Ambulation / Gait / Stairs / Wheelchair Mobility  Ambulation/Gait Ambulation/Gait assistance: Mod assist Ambulation Distance (Feet): (hallway ambulation) Assistive device: Rolling walker (2 wheeled) Gait Pattern/deviations: Step-through pattern, Ataxic, Drifts right/left, Scissoring, Narrow base of support, Decreased stride length General Gait Details: cues for posture/forward gaze, safe proximity to RW, and gait velocity; pt tends to increase gait velocity increasing ataxia of L LE; LOB with each turn/directional change requiring assistance and increased unsteadiness with horizontal head turns Gait velocity interpretation: <1.8 ft/sec, indicate of risk for recurrent falls    Posture / Balance Dynamic Sitting Balance Sitting balance - Comments: static sitting supervision, pt attempted to wipe himself in bathroom from toilet and almost fell forward and to the left off of the commode.  Balance Overall balance assessment: Needs assistance Sitting-balance support: Bilateral upper  extremity supported, No upper extremity supported Sitting balance-Leahy Scale: Fair Sitting balance - Comments: static sitting supervision, pt attempted to wipe himself in bathroom from toilet and almost fell forward and to the left off of the commode.  Standing balance support: Bilateral upper extremity supported Standing  balance-Leahy Scale: Poor Standing balance comment: Ableto achieve midlien postrual control with firm feedback gien to chest and lower back    Special needs/care consideration BiPAP/CPAP n/a CPM  N/a Continuous Drip IV n/a Dialysis n/a Life Vest  N/a Oxygen  N/a Special Bed  N/a Trach Size  N/a Wound Vac n/a Skin n/a Bowel mgmt: continent Bladder mgmt: continent Diabetic mgmt  N/a   Previous Home Environment Living Arrangements: Spouse/significant other Available Help at Discharge: Family Type of Home: House Home Layout: One level Home Access: Stairs to enter Entrance Stairs-Rails: Right Entrance Stairs-Number of Steps: 4 Bathroom Shower/Tub: Multimedia programmer: Handicapped height Bathroom Accessibility: Yes How Accessible: Accessible via walker Home Care Services: No  Discharge Living Setting Plans for Discharge Living Setting: Patient's home, Lives with (comment)(wife) Type of Home at Discharge: House Discharge Home Layout: One level Discharge Home Access: Stairs to enter Entrance Stairs-Rails: Right Entrance Stairs-Number of Steps: 4 Discharge Bathroom Shower/Tub: Walk-in shower Discharge Bathroom Toilet: Handicapped height Discharge Bathroom Accessibility: Yes How Accessible: Accessible via walker Does the patient have any problems obtaining your medications?: No  Social/Family/Support Systems Patient Roles: Spouse, Parent Contact Information: Mariann Laster, wife Anticipated Caregiver: wife Anticipated Ambulance person Information: see above Ability/Limitations of Caregiver: no limitations Caregiver Availability: 24/7 Discharge  Plan Discussed with Primary Caregiver: Yes Is Caregiver In Agreement with Plan?: Yes Does Caregiver/Family have Issues with Lodging/Transportation while Pt is in Rehab?: No  Goals/Additional Needs Patient/Family Goal for Rehab: supervision to min assist with PT and OT Expected length of stay: ELOS 12 to 17 days Pt/Family Agrees to Admission and willing to participate: Yes Program Orientation Provided & Reviewed with Pt/Caregiver Including Roles  & Responsibilities: Yes  Decrease burden of Care through IP rehab admission: n/a  Possible need for SNF placement upon discharge:n/a  Patient Condition: This patient's condition remains as documented in the consult dated *01/23/2018 over 48 hrs, in which the Rehabilitation Physician determined and documented that the patient's condition is appropriate for intensive rehabilitative care in an inpatient rehabilitation facility.Patient overall functional level is mi to mod assist. Updated medical workup is in H and P. Will admit to inpatient rehab today.  Preadmission Screen Completed By:  Cleatrice Burke, 01/26/2018 3:21 PM ______________________________________________________________________   Discussed status with Dr. Naaman Plummer on 01/26/2018 at 1552 and received telephone approval for admission today.  Admission Coordinator:  Cleatrice Burke, time 1779 Date 01/26/2018             Cosigned by: Meredith Staggers, MD at 01/26/2018 3:42 PM  Revision History

## 2018-01-26 NOTE — PMR Pre-admission (Signed)
PMR Admission Coordinator Pre-Admission Assessment  Patient: Patrick Maxwell is an 84 y.o., male MRN: [id number] DOB: 1930/12/29 Height: 5\' 11"  (180.3 cm) Weight: 88 kg (194 lb)              Insurance Information HMO:     PPO: yes     PCP:      IPA:      80/20:      OTHER: medicare advantage plan PRIMARY: [employer]      Policy#: [id number]      Subscriber: pt CM Name: Manuela Schwartz      Phone#: [phone]   Fax#: Epic access Pre-Cert#: [id number] cert for 7 days      Employer: retired Benefits:  Phone #: [phone]     Name: 01/26/2018 Eff. Date: 09/22/2017     Deduct: none      Out of Pocket Max: $3400      Life Max: none CIR: $295 co pay per day days 1 until 6      SNF: $20 co pay per day days 1 to 6; $160 co pay per day days 7 to 90 Outpatient: $15 co pay per visit     Co-Pay: visits per medical neccesity Home Health: 100%      Co-Pay: visits per medical neccesity DME: 80%     Co-Pay: 20% Providers: in network  SECONDARY: none        Medicaid Application Date:       Case Manager:  Disability Application Date:       Case Worker:   Emergency Facilities manager Information    Name Relation Home Work Pole Ojea [phone]  [phone]     Current Medical History  Patient Admitting Diagnosis:  Left CVA  History of Present Illness: Patrick Maxwell is an 84 year old right-handed male with history of CVA 2009 maintained on aspirin, hypertension, chronic systolic congestive heart failure, CKD stage II, hyperlipidemia, CAD, bradycardia with PVCs.  Per chart review patient lives with spouse.  Independent prior to admission.  One level home with 4 steps to entry.  Presented 01/21/2018 with vertigo and tingling on his left side.  He was noted to be ataxic.  He presented to the ED.  MRI showed left cerebellar infarction.  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  CT angiogram negative for large vessel occlusion, left PICA partial thrombosis.  Echocardiogram with ejection fraction of 30 to 35% with diffuse hypokinesis.  Neurology follow-up currently maintained on aspirin as well as the addition of Plavix for CVA prophylaxis x3 weeks then Plavix alone.  A repeat echocardiogram was completed 01/25/2018 to rule out apical thrombus that was negative/no cardiac source of emboli identified.  There was noted diffuse hypokinesis ejection fraction was 25%.  Subcutaneous heparin for DVT prophylaxis.  TEE completed 01/26/2018 showing no thrombus, ejection fraction 20 to 25%.  Severe global hypokinesis.  No plan for loop recorder placement .  Tolerating a regular diet.   Total: 0 NIHSS    Past Medical History  Past Medical History:  Diagnosis Date  . CAD (coronary artery disease)   . CHF (congestive heart failure) ([place])   . Frequent unifocal PVCs    Palpitations since early 20s, PVCs and pairs  . Hyperlipidemia   . Hypertension   . Stroke [facility]) 2009    Family History  family history includes  Cancer in his brother; Early death in his mother; Stroke in his mother.  Prior Rehab/Hospitalizations:  Has the patient had major surgery during 100 days prior to admission? No  Current Medications   Current Facility-Administered Medications:  .  acetaminophen (TYLENOL) tablet 650 mg, 650 mg, Oral, Q4H PRN **OR** acetaminophen (TYLENOL) solution 650 mg, 650 mg, Per Tube, Q4H PRN **OR** acetaminophen (TYLENOL) suppository 650 mg, 650 mg, Rectal, Q4H PRN, Shawn Route, Sara E, PA-C .  apixaban (ELIQUIS) tablet 5 mg, 5 mg, Oral, BID, Gherghe, Costin M, MD .  aspirin EC tablet 81 mg, 81 mg, Oral, Daily, [place], Pearl, NP, 81 mg at 01/25/18 0928 .  atorvastatin (LIPITOR) tablet 40 mg, 40 mg, Oral, q1800, Jacob Moores, NP, Stopped at 01/25/18 1858 .  carvedilol (COREG) tablet 3.125 mg, 3.125 mg, Oral, BID, Rondel Jumbo, PA-C, Stopped at 01/25/18  2305 .  cholecalciferol (VITAMIN D) tablet 5,000 Units, 5,000 Units, Oral, Daily, Rondel Jumbo, PA-C, 5,000 Units at 01/25/18 3016 .  hydrALAZINE (APRESOLINE) injection 5 mg, 5 mg, Intravenous, Q8H PRN, Rondel Jumbo, PA-C, 5 mg at 01/21/18 0936 .  meclizine (ANTIVERT) tablet 25 mg, 25 mg, Oral, TID PRN, Rondel Jumbo, PA-C, 25 mg at 01/21/18 0935 .  polyethylene glycol (MIRALAX / GLYCOLAX) packet 17 g, 17 g, Oral, Daily, Caren Griffins, MD, 17 g at 01/25/18 0928 .  senna-docusate (Senokot-S) tablet 1 tablet, 1 tablet, Oral, QHS PRN, Shawn Route, Sara E, PA-C .  senna-docusate (Senokot-S) tablet 2 tablet, 2 tablet, Oral, Once, Gherghe, Costin M, MD .  tamsulosin (FLOMAX) capsule 0.4 mg, 0.4 mg, Oral, Daily, Rondel Jumbo, PA-C, 0.4 mg at 01/25/18 0109  Patients Current Diet:  Diet Order           Diet Heart Room service appropriate? Yes; Fluid consistency: Thin  Diet effective now          Precautions / Restrictions Precautions Precautions: Fall Precaution Comments: left sided ataxia and left lateral lean Restrictions Weight Bearing Restrictions: No   Has the patient had 2 or more falls or a fall with injury in the past year?No  Prior Activity Level Community (5-7x/wk): independent without AD; retired Airline pilot, likes to garden  Development worker, international aid / Paramedic Devices/Equipment: [employer]: None  Prior Device Use: Indicate devices/aids used by the patient prior to current illness, exacerbation or injury? None of the above  Prior Functional Level Prior Function Level of Independence: Independent  Self Care: Did the patient need help bathing, dressing, using the toilet or eating?  Independent  Indoor Mobility: Did the patient need assistance with walking from room to room (with or without device)? Independent  Stairs: Did the patient need assistance with internal or external stairs (with or without device)? Independent  Functional  Cognition: Did the patient need help planning regular tasks such as shopping or remembering to take medications? Independent  Current Functional Level Cognition  Overall Cognitive Status: Within Functional Limits for tasks assessed Orientation Level: Oriented X4    Extremity Assessment (includes Sensation/Coordination)  Upper Extremity Assessment: Defer to OT evaluation LUE Deficits / Details: decreased coordination noted LUE with finger nose finger activity as well as fxal task  Lower Extremity Assessment: LLE deficits/detail LLE Deficits / Details: left leg with 4+/5 strength throughout, but incoordinated with gross functional movements like gait.   LLE Coordination: decreased gross motor    ADLs  Overall ADL's : Needs assistance/impaired Eating/Feeding: Sitting, Minimal assistance Grooming: Standing, Minimal assistance Upper Body  Bathing: Set up, Supervision/ safety, Sitting Lower Body Bathing: Moderate assistance, Sit to/from stand Upper Body Dressing : Minimal assistance, Sitting Lower Body Dressing: Moderate assistance, Sit to/from stand Toilet Transfer: Moderate assistance, Ambulation Toilet Transfer Details (indicate cue type and reason): ambulated to sink with therapist waslking on pt's left side with pt's L arm over shoulder to facilitaet step through pattern adn weight shift Toileting- Clothing Manipulation and Hygiene: Moderate assistance Toileting - Clothing Manipulation Details (indicate cue type and reason): sit to stand noted.  ataxia noted with sit to stand transition as well as standing Functional mobility during ADLs: Moderate assistance General ADL Comments: Facilitaed control of movement patterns during ADL tasks. Pt with good ability to use feedback to modify movement.    Mobility  Overal bed mobility: Needs Assistance Bed Mobility: Supine to Sit, Sit to Supine Supine to sit: Supervision Sit to supine: Supervision General bed mobility comments: supervision for  safety; c/o dizziness upon sitting     Transfers  Overall transfer level: Needs assistance Equipment used: Rolling walker (2 wheeled) Transfers: Sit to/from Stand Sit to Stand: Min assist Stand pivot transfers: Mod assist General transfer comment: assist to balance and cues for safe hand placement    Ambulation / Gait / Stairs / Wheelchair Mobility  Ambulation/Gait Ambulation/Gait assistance: Mod assist Ambulation Distance (Feet): (hallway ambulation) Assistive device: Rolling walker (2 wheeled) Gait Pattern/deviations: Step-through pattern, Ataxic, Drifts right/left, Scissoring, Narrow base of support, Decreased stride length General Gait Details: cues for posture/forward gaze, safe proximity to RW, and gait velocity; pt tends to increase gait velocity increasing ataxia of L LE; LOB with each turn/directional change requiring assistance and increased unsteadiness with horizontal head turns Gait velocity interpretation: <1.8 ft/sec, indicate of risk for recurrent falls    Posture / Balance Dynamic Sitting Balance Sitting balance - Comments: static sitting supervision, pt attempted to wipe himself in bathroom from toilet and almost fell forward and to the left off of the commode.  Balance Overall balance assessment: Needs assistance Sitting-balance support: Bilateral upper extremity supported, No upper extremity supported Sitting balance-Leahy Scale: Fair Sitting balance - Comments: static sitting supervision, pt attempted to wipe himself in bathroom from toilet and almost fell forward and to the left off of the commode.  Standing balance support: Bilateral upper extremity supported Standing balance-Leahy Scale: Poor Standing balance comment: Ableto achieve midlien postrual control with firm feedback gien to chest and lower back    Special needs/care consideration BiPAP/CPAP n/a CPM  N/a Continuous Drip IV n/a Dialysis n/a Life Vest  N/a Oxygen  N/a Special Bed  N/a Trach Size   N/a Wound Vac n/a Skin n/a Bowel mgmt: continent Bladder mgmt: continent Diabetic mgmt  N/a   Previous Home Environment Living Arrangements: Spouse/significant other Available Help at Discharge: Family Type of Home: House Home Layout: One level Home Access: Stairs to enter Entrance Stairs-Rails: Right Entrance Stairs-Number of Steps: 4 Bathroom Shower/Tub: Multimedia programmer: Handicapped height Bathroom Accessibility: Yes How Accessible: Accessible via walker Home Care Services: No  Discharge Living Setting Plans for Discharge Living Setting: Patient's home, Lives with (comment)(wife) Type of Home at Discharge: House Discharge Home Layout: One level Discharge Home Access: Stairs to enter Entrance Stairs-Rails: Right Entrance Stairs-Number of Steps: 4 Discharge Bathroom Shower/Tub: Walk-in shower Discharge Bathroom Toilet: Handicapped height Discharge Bathroom Accessibility: Yes How Accessible: Accessible via walker Does the patient have any problems obtaining your medications?: No  Social/Family/Support Systems Patient Roles: Spouse, Parent Contact Information: Mariann Laster, wife Anticipated Caregiver: wife  Anticipated Caregiver's Contact Information: see above Ability/Limitations of Caregiver: no limitations Caregiver Availability: 24/7 Discharge Plan Discussed with Primary Caregiver: Yes Is Caregiver In Agreement with Plan?: Yes Does Caregiver/Family have Issues with Lodging/Transportation while Pt is in Rehab?: No  Goals/Additional Needs Patient/Family Goal for Rehab: supervision to min assist with PT and OT Expected length of stay: ELOS 12 to 17 days Pt/Family Agrees to Admission and willing to participate: Yes Program Orientation Provided & Reviewed with Pt/Caregiver Including Roles  & Responsibilities: Yes  Decrease burden of Care through IP rehab admission: n/a  Possible need for SNF placement upon discharge:n/a  Patient Condition: This patient's  condition remains as documented in the consult dated *01/23/2018 over 48 hrs, in which the Rehabilitation Physician determined and documented that the patient's condition is appropriate for intensive rehabilitative care in an inpatient rehabilitation facility.Patient overall functional level is mi to mod assist. Updated medical workup is in H and P. Will admit to inpatient rehab today.  Preadmission Screen Completed By:  Cleatrice Burke, 01/26/2018 3:21 PM ______________________________________________________________________   Discussed status with Dr. Naaman Plummer on 01/26/2018 at 1552 and received telephone approval for admission today.  Admission Coordinator:  Cleatrice Burke, time 4801 Date 01/26/2018

## 2018-01-26 NOTE — Care Management Important Message (Signed)
Important Message  Patient Details  Name: Patrick Maxwell MRN: [id number] Date of Birth: 1931-04-25   Medicare Important Message Given:  Yes    Seiya Silsby 01/26/2018, 11:06 AM

## 2018-01-26 NOTE — Evaluation (Signed)
Occupational Therapy Assessment and Plan  Patient Details  Name: FLYNT BREEZE MRN: [id number] Date of Birth: 1930-11-15  OT Diagnosis: ataxia and muscle weakness (generalized) Rehab Potential: Rehab Potential (ACUTE ONLY): Excellent ELOS: 7-10 days   Today's Date: 01/27/2018 OT Individual Time: 1000-1100 OT Individual Time Calculation (min): 60 min     Problem List:  Patient Active Problem List   Diagnosis Date Noted  . Cerebral infarction involving cerebellar artery ([place]) 01/26/2018  . Ischemic cardiomyopathy   . Cerebellar infarct ([place])   . Leukocytosis   . Prediabetes   . Acute CVA (cerebrovascular accident) ([place]) 01/21/2018  . Bradycardia 04/20/2017  . BPH with elevated PSA 01/06/2017  . PVC's (premature ventricular contractions) 01/23/2016  . Coronary artery disease involving native coronary artery of native heart with unstable angina pectoris ([place])   . Acute combined systolic and diastolic congestive heart failure ([place])   . Angina decubitus ([place])   . CHF (congestive heart failure) ([place]) 01/21/2016  . Palpitations 12/25/2015  . Subclinical hypothyroidism 12/21/2015  . Hypercholesteremia 01/17/2011  . History of CVA (cerebrovascular accident) without residual deficits 12/20/2010  . Hypertension, benign 12/20/2010    Past Medical History:  Past Medical History:  Diagnosis Date  . CAD (coronary artery disease)   . CHF (congestive heart failure) ([place])   . Frequent unifocal PVCs    Palpitations since early 20s, PVCs and pairs  . Hyperlipidemia   . Hypertension   . Stroke [facility]) 2009   Past Surgical History:  Past Surgical History:  Procedure Laterality Date  . CARDIAC CATHETERIZATION N/A 01/23/2016   Procedure: Right/Left Heart Cath and Coronary Angiography;  Surgeon: Burnell Blanks, MD;  Location: [facility];  Service: Cardiovascular;  Laterality: N/A;  . CARDIAC CATHETERIZATION N/A 01/23/2016   Procedure: Coronary Stent Intervention;  Surgeon:  Burnell Blanks, MD;  Location: [facility];  Service: Cardiovascular;  Laterality: N/A;  . HERNIA REPAIR  06/1992    Assessment & Plan Clinical Impression: Patient is a 84 y.o. year old male with recent admission to the hospital on  01/21/2018 with vertigo and tingling on his left side.  He was noted to be ataxic.  He presented to the ED.  MRI showed left cerebellar infarction.  Per report, acute left posterior inferior cerebellar artery infarction with chronic small vessel disease as well as old left basal ganglia and corona radiata infarcts.  CT angiogram negative for large vessel occlusion, left PICA partial thrombosis.  Echocardiogram with ejection fraction of 30 to 35% with diffuse hypokinesis.  Neurology follow-up currently maintained on aspirin as well as the addition of Plavix for CVA prophylaxis x3 weeks then Plavix alone.  A repeat echocardiogram was completed 01/25/2018 to rule out apical thrombus that was negative/no cardiac source of emboli identified.  There was noted diffuse hypokinesis ejection fraction was 25%.  Subcutaneous heparin for DVT prophylaxis.  TEE completed 01/26/2018 showing no thrombus, ejection fraction 20 to 25%.  Severe global hypokinesis. Patient transferred to CIR on 01/26/2018 .    Patient currently requires min with basic self-care skills secondary to muscle weakness and decreased standing balance, decreased postural control, ataxia, decreased coordination, and decreased balance strategies.  Prior to hospitalization, patient could complete BADL with independent .  Patient will benefit from skilled intervention to increase independence with basic self-care skills prior to discharge home with care partner.  Anticipate patient will require intermittent supervision and follow up outpatient.  OT - End of Session Endurance Deficit: Yes Endurance  Deficit Description: Rest breaks within BADL tasks OT Assessment Rehab Potential (ACUTE ONLY): Excellent OT Patient  demonstrates impairments in the following area(s): Balance;Endurance;Motor;Safety OT Basic ADL's Functional Problem(s): Grooming;Bathing;Toileting;Dressing OT Transfers Functional Problem(s): Toilet;Tub/Shower OT Additional Impairment(s): Fuctional Use of Upper Extremity OT Plan OT Intensity: Minimum of 1-2 x/day, 45 to 90 minutes OT Frequency: 5 out of 7 days OT Duration/Estimated Length of Stay: 7-10 days OT Treatment/Interventions: Balance/vestibular training;Community reintegration;Discharge planning;Patient/family education;Neuromuscular re-education;Functional mobility training;Self Care/advanced ADL retraining;Therapeutic Activities;Therapeutic Exercise;UE/LE Coordination activities;UE/LE Strength taining/ROM OT Basic Self-Care Anticipated Outcome(s): Mod I OT Toileting Anticipated Outcome(s): Mod I OT Bathroom Transfers Anticipated Outcome(s): Mod I OT Recommendation Patient destination: Home Follow Up Recommendations: Outpatient OT Equipment Recommended: To be determined   Skilled Therapeutic Intervention Initial eval completed with treatment provided to address functional transfers, functional use of L UE, improved sit<>stand, standing tolerance, and adapted bathing/dressing skills. Pt ambulated to bathroom and transferred onto toilet with min HHA. Pt able to manage clothing with min guard for balance and voided bladder successfully. Min HHA to ambulate to shower, then sat on bench to remove clothing. Bathing completed with set-up A and min guard A for balance when standing to wash buttocks. Ambulated out of bathroom with min HHA. Set-up A for dressing with min guard A for balance when pulling up pants. Addressed rehab process, OT purpose, POC, ELOS, and goals.  Pt left seated in wc at end of session with chair alarm on and needs met.  OT Evaluation Precautions/Restrictions  Precautions Precautions: Fall Precaution Comments: left sided ataxia and left lateral  lean Restrictions Weight Bearing Restrictions: No Pain  none/denies pain Home Living/Prior Functioning Home Living Family/patient expects to be discharged to:: Private residence Living Arrangements: Spouse/significant other Available Help at Discharge: Family Type of Home: House Home Access: Stairs to enter Technical brewer of Steps: 4 Entrance Stairs-Rails: Left Home Layout: One level Bathroom Shower/Tub: Multimedia programmer: Handicapped height Bathroom Accessibility: Yes  Lives With: Spouse IADL History Homemaking Responsibilities: Yes Current License: Yes Occupation: Retired 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 Works outside in the yard, enjoys walking in his neighborhood ADL ADL ADL Comments: Please see functional navigator Vision Patient Visual Report: No change from baseline Perception  Perception: Within Functional Limits Cognition Overall Cognitive Status: Within Functional Limits for tasks assessed Arousal/Alertness: Awake/alert Orientation Level: Place;Person;Situation Person: Oriented Place: Oriented Situation: Oriented Year: 2019 Month: May Day of Week: Correct Memory: Appears intact Immediate Memory Recall: Blue;Sock;Bed Memory Recall: Blue Awareness: Appears intact Safety/Judgment: Appears intact Sensation Sensation Light Touch: Appears Intact Proprioception: Appears Intact Additional Comments: sensation appears intact B LEs Coordination Gross Motor Movements are Fluid and Coordinated: No Fine Motor Movements are Fluid and Coordinated: No Coordination and Movement Description: mild incoordination/ataxia L LE Finger Nose Finger Test: slight  dysmetira Heel Shin Test: L LE slow and ataxic, R LE WFL Motor  Motor Motor: Ataxia Motor - Skilled Clinical Observations: mild ataxia L UE/L LE Trunk/Postural Assessment  Cervical Assessment Cervical Assessment: Within Functional Limits Thoracic Assessment Thoracic Assessment: Within Functional Limits Lumbar Assessment Lumbar Assessment: Within Functional Limits Postural Control Postural Control: Within Functional Limits  Balance Balance Balance Assessed: Yes Static Sitting Balance Static Sitting - Level of Assistance: 5: Stand by assistance Dynamic Sitting Balance Dynamic Sitting - Level  of Assistance: 5: Stand by assistance Static Standing Balance Static Standing - Level of Assistance: 4: Min assist Dynamic Standing Balance Dynamic Standing - Level of Assistance: 4: Min assist Extremity/Trunk Assessment RUE Assessment RUE Assessment: Within Functional Limits LUE Assessment LUE Assessment: Within Functional Limits   See Function Navigator for Current Functional Status.   Refer to Care Plan for Long Term Goals  Recommendations for other services: None    Discharge Criteria: Patient will be discharged from OT if patient refuses treatment 3 consecutive times without medical reason, if treatment goals not met, if there is a change in medical status, if patient makes no progress towards goals or if patient is discharged from hospital.  The above assessment, treatment plan, treatment alternatives and goals were discussed and mutually agreed upon: by patient  Valma Cava 01/27/2018, 10:58 AM

## 2018-01-27 ENCOUNTER — Inpatient Hospital Stay (HOSPITAL_COMMUNITY): Payer: PPO | Admitting: Occupational Therapy

## 2018-01-27 ENCOUNTER — Inpatient Hospital Stay (HOSPITAL_COMMUNITY): Payer: PPO

## 2018-01-27 ENCOUNTER — Inpatient Hospital Stay (HOSPITAL_COMMUNITY): Payer: PPO | Admitting: Physical Therapy

## 2018-01-27 DIAGNOSIS — I69393 Ataxia following cerebral infarction: Secondary | ICD-10-CM

## 2018-01-27 DIAGNOSIS — R269 Unspecified abnormalities of gait and mobility: Secondary | ICD-10-CM

## 2018-01-27 DIAGNOSIS — I69398 Other sequelae of cerebral infarction: Secondary | ICD-10-CM

## 2018-01-27 DIAGNOSIS — R001 Bradycardia, unspecified: Secondary | ICD-10-CM

## 2018-01-27 DIAGNOSIS — N182 Chronic kidney disease, stage 2 (mild): Secondary | ICD-10-CM

## 2018-01-27 LAB — CBC WITH DIFFERENTIAL/PLATELET
BASOS ABS: 0 10*3/uL (ref 0.0–0.1)
BASOS PCT: 0 %
Eosinophils Absolute: 0.3 10*3/uL (ref 0.0–0.7)
Eosinophils Relative: 4 %
HEMATOCRIT: 40.1 % (ref 39.0–52.0)
Hemoglobin: 13.4 g/dL (ref 13.0–17.0)
LYMPHS PCT: 20 %
Lymphs Abs: 1.2 10*3/uL (ref 0.7–4.0)
MCH: 28.6 pg (ref 26.0–34.0)
MCHC: 33.4 g/dL (ref 30.0–36.0)
MCV: 85.5 fL (ref 78.0–100.0)
Monocytes Absolute: 0.5 10*3/uL (ref 0.1–1.0)
Monocytes Relative: 8 %
NEUTROS ABS: 4.4 10*3/uL (ref 1.7–7.7)
Neutrophils Relative %: 68 %
PLATELETS: 125 10*3/uL — AB (ref 150–400)
RBC: 4.69 MIL/uL (ref 4.22–5.81)
RDW: 15.7 % — ABNORMAL HIGH (ref 11.5–15.5)
WBC: 6.4 10*3/uL (ref 4.0–10.5)

## 2018-01-27 LAB — COMPREHENSIVE METABOLIC PANEL
ALK PHOS: 75 U/L (ref 38–126)
ALT: 40 U/L (ref 17–63)
AST: 33 U/L (ref 15–41)
Albumin: 3 g/dL — ABNORMAL LOW (ref 3.5–5.0)
Anion gap: 7 (ref 5–15)
BUN: 18 mg/dL (ref 6–20)
CALCIUM: 8.5 mg/dL — AB (ref 8.9–10.3)
CO2: 23 mmol/L (ref 22–32)
Chloride: 108 mmol/L (ref 101–111)
Creatinine, Ser: 1.15 mg/dL (ref 0.61–1.24)
GFR calc non Af Amer: 57 mL/min — ABNORMAL LOW (ref >60)
Glucose, Bld: 104 mg/dL — ABNORMAL HIGH (ref 65–99)
Potassium: 4.2 mmol/L (ref 3.5–5.1)
Sodium: 138 mmol/L (ref 135–145)
Total Bilirubin: 1.1 mg/dL (ref 0.3–1.2)
Total Protein: 5.3 g/dL — ABNORMAL LOW (ref 6.5–8.1)

## 2018-01-27 MED ORDER — ENOXAPARIN SODIUM 40 MG/0.4ML ~~LOC~~ SOLN
40.0000 mg | SUBCUTANEOUS | Status: DC
  Administered 2018-01-27: 40 mg via SUBCUTANEOUS
  Filled 2018-01-27: qty 0.4

## 2018-01-27 NOTE — Evaluation (Signed)
Physical Therapy Assessment and Plan  Patient Details  Name: Patrick Maxwell MRN: [id number] Date of Birth: 01/06/1931  PT Diagnosis: Ataxia, Difficulty walking and Muscle weakness Rehab Potential: Good ELOS: 7-9 days   Today's Date: 01/27/2018 PT Individual Time: 1478-2956, 1630-1700 PT Individual Time Calculation (min): 58 min  AND 30 min  Problem List:  Patient Active Problem List   Diagnosis Date Noted  . Cerebral infarction involving cerebellar artery ([place]) 01/26/2018  . Ischemic cardiomyopathy   . Cerebellar infarct ([place])   . Leukocytosis   . Prediabetes   . Acute CVA (cerebrovascular accident) ([place]) 01/21/2018  . Bradycardia 04/20/2017  . BPH with elevated PSA 01/06/2017  . PVC's (premature ventricular contractions) 01/23/2016  . Coronary artery disease involving native coronary artery of native heart with unstable angina pectoris ([place])   . Acute combined systolic and diastolic congestive heart failure ([place])   . Angina decubitus ([place])   . CHF (congestive heart failure) ([place]) 01/21/2016  . Palpitations 12/25/2015  . Subclinical hypothyroidism 12/21/2015  . Hypercholesteremia 01/17/2011  . History of CVA (cerebrovascular accident) without residual deficits 12/20/2010  . Hypertension, benign 12/20/2010    Past Medical History:  Past Medical History:  Diagnosis Date  . CAD (coronary artery disease)   . CHF (congestive heart failure) ([place])   . Frequent unifocal PVCs    Palpitations since early 20s, PVCs and pairs  . Hyperlipidemia   . Hypertension   . Stroke [facility]) 2009   Past Surgical History:  Past Surgical History:  Procedure Laterality Date  . CARDIAC CATHETERIZATION N/A 01/23/2016   Procedure: Right/Left Heart Cath and Coronary Angiography;  Surgeon: Burnell Blanks, MD;  Location: [facility];  Service: Cardiovascular;  Laterality: N/A;  . CARDIAC CATHETERIZATION N/A 01/23/2016   Procedure: Coronary Stent Intervention;  Surgeon: Burnell Blanks, MD;  Location: [facility];  Service: Cardiovascular;  Laterality: N/A;  . HERNIA REPAIR  06/1992    Assessment & Plan Clinical Impression: Patient is a 84 y.o. year old male with history of CVA 2009 maintained on aspirin, hypertension, chronic systolic congestive heart failure, CKD stage II, hyperlipidemia, CAD, bradycardia with PVCs. Per chart review patient lives with spouse. Independent prior to admission. One level home with 4 steps to entry. Presented 01/21/2018 with vertigo and tingling on his left side. He was noted to be ataxic. He presented to the ED. MRI showed left cerebellar infarction. Per report, acute left posterior inferior cerebellar artery infarction with chronic small vessel disease as well as old left basal ganglia and corona radiata infarcts. CT angiogram negative for large vessel occlusion, left PICA partial thrombosis. Echocardiogram with ejection fraction of 30 to 35% with diffuse hypokinesis. Neurology follow-up currently maintained on aspirin as well as the addition of Plavix for CVA prophylaxis x3 weeks then Plavix alone. A repeat echocardiogram was completed 01/25/2018 to rule out apical thrombus that was negative/no cardiac source of emboli identified. There was noted diffuse hypokinesis ejection fraction was 25%. Subcutaneousheparin for DVT prophylaxis. TEE completed 01/26/2018 showing no thrombus, ejection fraction 20 to 25%. Severe global hypokinesis. No plan forloop recorder placement .Tolerating a regular diet. Physical and occupational therapy evaluations completed with recommendations of physical medicine rehab consult. Patient was admitted for a comprehensive rehab program. Patient transferred to CIR on 01/26/2018 .   Patient currently requires min with mobility secondary to muscle weakness, ataxia and decreased coordination and decreased standing balance and decreased balance strategies.  Prior to hospitalization, patient was independent  with mobility and lived with Spouse in a House home.  Home access is 4Stairs to enter.  Patient will benefit from skilled PT intervention to maximize safe functional mobility, minimize fall risk and decrease caregiver burden for planned discharge intermittent supervision.  Anticipate patient will benefit from follow up OP at discharge.  PT - End of Session Activity Tolerance: Tolerates 30+ min activity with multiple rests Endurance Deficit: Yes PT Assessment Rehab Potential (ACUTE/IP ONLY): Good PT Patient demonstrates impairments in the following area(s): Balance;Sensory;Endurance;Motor;Nutrition;Safety PT Transfers Functional Problem(s): Bed Mobility;Bed to Chair;Car;Furniture;Floor PT Locomotion Functional Problem(s): Ambulation;Wheelchair Mobility;Stairs PT Plan PT Intensity: Minimum of 1-2 x/day ,45 to 90 minutes PT Frequency: 5 out of 7 days PT Duration Estimated Length of Stay: 7-9 days PT Treatment/Interventions: Discharge planning;Ambulation/gait training;Functional mobility training;Therapeutic Activities;Visual/perceptual remediation/compensation;Balance/vestibular training;Disease management/prevention;Neuromuscular re-education;Therapeutic Exercise;Wheelchair propulsion/positioning;Cognitive remediation/compensation;DME/adaptive equipment instruction;Pain management;Splinting/orthotics;UE/LE Strength taining/ROM;Community reintegration;Functional electrical stimulation;Patient/family education;Stair training;UE/LE Coordination activities PT Transfers Anticipated Outcome(s): Mod I PT Locomotion Anticipated Outcome(s): supervision PT Recommendation Recommendations for Other Services: Therapeutic Recreation consult Follow Up Recommendations: Outpatient PT Patient destination: Home Equipment Details: tbd  Skilled Therapeutic Intervention Session 1: Evaluation completed (see details above and below) with education on PT POC and goals and individual treatment initiated with focus on  ambulation, functional transfers and stair training. Pt supine in bed upon PT arrival, agreeable to therapy tx and denies pain. Examination completed as detailed below. Pt transferred to sitting with supervision. Pt sitting EOB donned shorts, sit<>stand with min assist to pull over hips. Pt seated EOB doffed gown and donned t shirt with supervision. Pt performed stand pivot from w/c>bed with min assist and transported to the gym. Pt performed pre-gait in place without AD, min assist, verbal cues for foot placement and to prevent scissoring with L LE. Pt ambulated x 40 ft and x 150 ft without AD, mod assist for occasional scissoring and LOB to the L with turns. Pt ascended/descended 12 steps with B handrails and reciprocal pattern, min assist. Pt performed car transfer min assist stand pivot. Pt ambulated x 150 ft with RW and min assist. Pt transported back to room and left seated with chair alarm set.   Session 2: Pt supine in bed upon PT arrival, agreeable to therapy tx and denies pain. Pt transferred to sitting with supervision and ambulated to gym with RW and min assist x150 ft. Pt worked on dynamic standing balance and coordination to perform the following: standing on airex while bouncing ball, sit<>stands on airex x 10, forwards/backwards ambulation 2 x 20 ft, and toe taps on 4 inch step to target numbers. Pt requiring min-mod assist for balance tasks to correction occasional LOB to the L. Pt very eager to go home, educated pt and his wife about pt's deficits and need for therapy to improve balance and coordination. Pt states he does not think rehab will help, although very agreeable to participate in interventions. Wife and family agrees and insists that pt stay for therapy. Pt ambulated back to room with RW and left seated in w/c with needs in reach and chair alarm set.    PT Evaluation Precautions/Restrictions Precautions Precautions: Fall Precaution Comments: left sided ataxia and left lateral  lean Restrictions Weight Bearing Restrictions: No General   Vital SignsTherapy Vitals Temp: 98 F (36.7 C) Temp Source: Oral Pulse Rate: (!) 44 Resp: 18 BP: 139/83 Patient Position (if appropriate): Lying Oxygen Therapy SpO2: 96 % O2 Device: Room Air Pain   denies pain Home Living/Prior Functioning Home Living Available Help at  Discharge: Family Type of Home: House Home Access: Stairs to enter Technical brewer of Steps: 4 Entrance Stairs-Rails: Left Home Layout: One level  Lives With: Spouse Prior Function Level of Independence: Independent with basic ADLs;Independent with gait  Able to Take Stairs?: Yes Driving: Yes Vocation: Retired Comments: retired, used to drive trucks. Works outside in the yard, enjoys walking in his neighborhood Cognition Overall Cognitive Status: Within Functional Limits for tasks assessed Arousal/Alertness: Awake/alert Orientation Level: Oriented X4 Memory: Appears intact Awareness: Appears intact Safety/Judgment: Appears intact Sensation Sensation Light Touch: Appears Intact Proprioception: Appears Intact Additional Comments: sensation appears intact B LEs Coordination Gross Motor Movements are Fluid and Coordinated: No Fine Motor Movements are Fluid and Coordinated: No Coordination and Movement Description: mild incoordination/ataxia L LE Heel Shin Test: L LE slow and ataxic, R LE WFL Motor  Motor Motor: Ataxia Motor - Skilled Clinical Observations: mild ataxia L UE/L LE  Trunk/Postural Assessment  Cervical Assessment Cervical Assessment: Within Functional Limits Thoracic Assessment Thoracic Assessment: Within Functional Limits Lumbar Assessment Lumbar Assessment: Within Functional Limits Postural Control Postural Control: Within Functional Limits  Balance Balance Balance Assessed: Yes Static Sitting Balance Static Sitting - Level of Assistance: 5: Stand by assistance Dynamic Sitting Balance Dynamic Sitting - Level  of Assistance: 5: Stand by assistance Static Standing Balance Static Standing - Level of Assistance: 4: Min assist Dynamic Standing Balance Dynamic Standing - Level of Assistance: 4: Min assist Extremity Assessment  RLE Assessment RLE Assessment: Within Functional Limits LLE Assessment LLE Assessment: Within Functional Limits   See Function Navigator for Current Functional Status.   Refer to Care Plan for Long Term Goals  Recommendations for other services: None   Discharge Criteria: Patient will be discharged from PT if patient refuses treatment 3 consecutive times without medical reason, if treatment goals not met, if there is a change in medical status, if patient makes no progress towards goals or if patient is discharged from hospital.  The above assessment, treatment plan, treatment alternatives and goals were discussed and mutually agreed upon: by patient  Netta Corrigan, PT, DPT 01/27/2018, 10:00 AM

## 2018-01-27 NOTE — Progress Notes (Signed)
Discussed the case with Dr. Haroldine Laws and also with Dr. Angelena Form.  He has markedly reduced left ventricular systolic and also right ventricular systolic function.  There is a possibility that his frequent premature ventricular contractions are playing a role.  We will start amiodarone 200 mg twice a day for 1 month and then reduce the dose to 200 mg once a day.  Dr. Haroldine Laws has offered to follow-up with him in the next month or so.  He has also been started on Eliquis for his markedly reduced left ventricular systolic function and high risk for thrombus formation in the left ventricle.  We will sign off.  No additional cardiac recommendations at this time.    Mertie Moores, MD  01/27/2018 11:11 AM    [address],  [place],   [address] Pager [phone] Phone: [phone]; Fax: [phone]

## 2018-01-27 NOTE — Progress Notes (Signed)
Subjective/Complaints:   Objective: Vital Signs: Blood pressure 139/83, pulse (!) 44, temperature 98 F (36.7 C), temperature source Oral, resp. rate 18, height '5\' 10"'$  (1.778 m), weight 85.8 kg (189 lb 2.5 oz), SpO2 96 %. No results found. Results for orders placed or performed during the hospital encounter of 01/26/18 (from the past 72 hour(s))  CBC WITH DIFFERENTIAL     Status: Abnormal   Collection Time: 01/27/18  6:01 AM  Result Value Ref Range   WBC 6.4 4.0 - 10.5 K/uL   RBC 4.69 4.22 - 5.81 MIL/uL   Hemoglobin 13.4 13.0 - 17.0 g/dL   HCT 40.1 39.0 - 52.0 %   MCV 85.5 78.0 - 100.0 fL   MCH 28.6 26.0 - 34.0 pg   MCHC 33.4 30.0 - 36.0 g/dL   RDW 15.7 (H) 11.5 - 15.5 %   Platelets 125 (L) 150 - 400 K/uL   Neutrophils Relative % 68 %   Neutro Abs 4.4 1.7 - 7.7 K/uL   Lymphocytes Relative 20 %   Lymphs Abs 1.2 0.7 - 4.0 K/uL   Monocytes Relative 8 %   Monocytes Absolute 0.5 0.1 - 1.0 K/uL   Eosinophils Relative 4 %   Eosinophils Absolute 0.3 0.0 - 0.7 K/uL   Basophils Relative 0 %   Basophils Absolute 0.0 0.0 - 0.1 K/uL    Comment: Performed at [facility], [address], [place], [place] [address]  Comprehensive metabolic panel     Status: Abnormal   Collection Time: 01/27/18  6:01 AM  Result Value Ref Range   Sodium 138 135 - 145 mmol/L   Potassium 4.2 3.5 - 5.1 mmol/L   Chloride 108 101 - 111 mmol/L   CO2 23 22 - 32 mmol/L   Glucose, Bld 104 (H) 65 - 99 mg/dL   BUN 18 6 - 20 mg/dL   Creatinine, Ser 1.15 0.61 - 1.24 mg/dL   Calcium 8.5 (L) 8.9 - 10.3 mg/dL   Total Protein 5.3 (L) 6.5 - 8.1 g/dL   Albumin 3.0 (L) 3.5 - 5.0 g/dL   AST 33 15 - 41 U/L   ALT 40 17 - 63 U/L   Alkaline Phosphatase 75 38 - 126 U/L   Total Bilirubin 1.1 0.3 - 1.2 mg/dL   GFR calc non Af Amer 57 (L) >60 mL/min   GFR calc Af Amer >60 >60 mL/min    Comment: (NOTE) The eGFR has been calculated using the CKD EPI equation. This calculation has not been validated in all clinical  situations. eGFR's persistently <60 mL/min signify possible Chronic Kidney Disease.    Anion gap 7 5 - 15    Comment: Performed at [facility] [address], [place],  [address]     HEENT: normal and no facial dysesthesia Cardio: RRR and no murmur Resp: CTA B/L and Unlabored GI: BS positive and NT, ND Extremity:  No Edema Skin:   Intact Neuro: Alert/Oriented, Normal Sensory, Normal Motor, Abnormal FMC Ataxic/ dec FMC and Other Limb ataxia, dysmetria Left finger nose finger and left heel to shin Musc/Skel:  Normal and Other no pain with UE or LE ROM Gen NAD   Assessment/Plan: 1. 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 Physiatrist is providing close team supervision and 24 hour management of active medical problems listed below. Physiatrist and rehab team continue to assess barriers  to discharge/monitor patient progress toward functional and medical goals. FIM:                                  Medical Problem List and Plan: 1.Left hemiparesis withtruncalataxiasecondary to acute left posterior inferior cerebellar artery infarction -CIR PT, OT, SLP evals today 2. DVT Prophylaxis/Anticoagulation: Subcutaneous heparin change to Lovenox. Monitor for any bleeding episodes 3. Pain Management:Tylenol as needed 4. Mood:Provide emotional support 5. Neuropsych: This patientiscapable of making decisions on hisown behalf. 6. Skin/Wound Care:Routine skin checks 7. Fluids/Electrolytes/Nutrition:Routine in and outs with follow-up chemistries wnl except hypoalbuminemia 8.Chronic systolic congestive heart failure. Monitor for any signs of fluid overload 9.Hypertension. Coreg 3.125 mg twice daily Vitals:   01/26/18 2051 01/27/18 0700  BP: (!) 114/51 139/83  Pulse: (!) 51 (!) 44  Resp: 18  18  Temp: 98.6 F (37 C) 98 F (36.7 C)  SpO2: 95% 96%  10.History of bradycardia and PVC. Patient asymptomatic. Continue Coreg. Follow cardiology services as needed 11.CKD stage II. Follow-up chemistries norma      l12.Hyperlipidemia. Lipitor 13.BPH. Flomax 0.4 mg daily. Check PVR x3 14.Constipation. Laxative assistance    LOS (Days) 1 A FACE TO FACE EVALUATION WAS PERFORMED  Charlett Blake 01/27/2018, 8:30 AM

## 2018-01-27 NOTE — Progress Notes (Signed)
[facility] Individual Statement of Services  Patient Name:  Patrick Maxwell  Date:  01/27/2018  Welcome to the [facility].  Our goal is to provide you with an individualized program based on your diagnosis and situation, designed to meet your specific needs.  With this comprehensive rehabilitation program, you will be expected to participate in at least 3 hours of rehabilitation therapies Monday-Friday, with modified therapy programming on the weekends.  Your rehabilitation program will include the following services:  Physical Therapy (PT), Occupational Therapy (OT), 24 hour per day rehabilitation nursing, Case Management (Social Worker), Rehabilitation Medicine, Nutrition Services and Pharmacy Services  Weekly team conferences will be held on Wednesdays to discuss your progress.  Your Social Worker will talk with you frequently to get your input and to update you on team discussions.  Team conferences with you and your family in attendance may also be held.  Expected length of stay:  7 days Overall anticipated outcome:  Supervision  Depending on your progress and recovery, your program may change. Your Social Worker will coordinate services and will keep you informed of any changes. Your Social Worker's name and contact numbers are listed  below.  The following services may also be recommended but are not provided by the [facility] will be made to provide these services after discharge if needed.  Arrangements include referral to agencies that provide these services.  Your insurance has been verified to be:  [employer] Your primary doctor is:  Dr. Vic Blackbird  Pertinent information will be shared with your doctor and your insurance company.  Social Worker:  Alfonse Alpers, LCSW  [phone] or ([phone]  Information discussed with and copy given to patient by: Trey Sailors, 01/27/2018, 12:44 PM

## 2018-01-27 NOTE — Patient Care Conference (Signed)
Inpatient RehabilitationTeam Conference and Plan of Care Update Date: 01/27/2018   Time: 11:20 AM    Patient Name: Patrick Maxwell      Medical Record Number: [id number]  Date of Birth: [birth year] Sex: Male         Room/Bed: 4W19C/4W19C-01 Payor Info: Payor: [employer]AGE / Plan: Tennis Must / Product Type: *No Product type* /    Admitting Diagnosis: Syncope  Admit Date/Time:  01/26/2018  5:50 PM Admission Comments: No comment available   Primary Diagnosis:  <principal problem not specified> Principal Problem: <principal problem not specified>  Patient Active Problem List   Diagnosis Date Noted  . Cerebral infarction involving cerebellar artery ([place]) 01/26/2018  . Ischemic cardiomyopathy   . Cerebellar infarct ([place])   . Leukocytosis   . Prediabetes   . Acute CVA (cerebrovascular accident) ([place]) 01/21/2018  . Bradycardia 04/20/2017  . BPH with elevated PSA 01/06/2017  . PVC's (premature ventricular contractions) 01/23/2016  . Coronary artery disease involving native coronary artery of native heart with unstable angina pectoris ([place])   . Acute combined systolic and diastolic congestive heart failure ([place])   . Angina decubitus ([place])   . CHF (congestive heart failure) ([place]) 01/21/2016  . Palpitations 12/25/2015  . Subclinical hypothyroidism 12/21/2015  . Hypercholesteremia 01/17/2011  . History of CVA (cerebrovascular accident) without residual deficits 12/20/2010  . Hypertension, benign 12/20/2010    Expected Discharge Date: Expected Discharge Date: 02/08/18  Team Members Present: Physician leading conference: Dr. Alysia Penna Social Worker Present: Alfonse Alpers, LCSW Nurse Present: Leonette Nutting, RN PT Present: Michaelene Song, PT OT Present: Cherylynn Ridges, OT SLP Present: Weston Anna, SLP PPS Coordinator present : Daiva Nakayama, RN, CRRN     Current Status/Progress Goal Weekly Team Focus  Medical   Left hemi-ataxia, poor balance.  Cognition is  intact.  Bowel and bladder are working.  Intermittent elevated blood pressure but bradycardic  Maintain medical stability after stroke, secondary stroke prevention  Stroke rehab evaluations   Bowel/Bladder   continent of B/B  min assist  Assist with toileting ass needed   Swallow/Nutrition/ Hydration             ADL's   Supervision/min A overall  Supervision/mod I  modified bathing/dressing, coordination, mild ataxia, pt.family education, dc planning   Mobility   supervision bed mobility, min-mod assist for gait without AD, min assist steps with handrails  supervision-Mod I  high level balance, d/c planning, coordination, education   Communication             Safety/Cognition/ Behavioral Observations            Pain   Denies Pain  Pain < 2  Assess qshift and prn   Skin   Skin intact  Prevent infection  Assess Qshift and PRN    Rehab Goals Patient on target to meet rehab goals: Yes Rehab Goals Revised: none - this is pt's first conference and day of evaluations *See Care Plan and progress notes for long and short-term goals.     Barriers to Discharge  Current Status/Progress Possible Resolutions Date Resolved   Physician    Medical stability     Initial evaluations in progress  Continue rehabilitation eval's, short length of stay anticipated      Nursing  Medical stability;Home environment access/layout               PT  OT                  SLP                SW                Discharge Planning/Teaching Needs:  Pt plans to return home with his wife to provide supervision, as needed.  Family education to be provided, as needed.   Team Discussion:  Pt with cerebral infarct with ataxia being his main deficit.  Otherwise, pt is doing well.  He is continent of bowel and bladder and has no c/o pain.  Pt did well with OT and is approaching supervision overall with some ataxia and balance issues.  Pt walked 150' with PT at min A without assistive device.   Pt has mild ataxia with left foot, but with cues, corrects for it.  Also corrects for loss of balance and did 12 stairs with min A and handrails.  Pt's transfer are min A and overall goals are supervision to mod I, hopefully without AD.  Revisions to Treatment Plan:  none    Continued Need for Acute Rehabilitation Level of Care: The patient requires daily medical management by a physician with specialized training in physical medicine and rehabilitation for the following conditions: Daily direction of a multidisciplinary physical rehabilitation program to ensure safe treatment while eliciting the highest outcome that is of practical value to the patient.: Yes Daily medical management of patient stability for increased activity during participation in an intensive rehabilitation regime.: Yes Daily analysis of laboratory values and/or radiology reports with any subsequent need for medication adjustment of medical intervention for : Neurological problems;Blood pressure problems  Breaunna Gottlieb, Silvestre Mesi 01/27/2018, 12:11 PM

## 2018-01-27 NOTE — Progress Notes (Signed)
Physical Therapy Session Note  Patient Details  Name: Patrick Maxwell MRN: [id number] Date of Birth: 11/08/1930  Today's Date: 01/27/2018 PT Individual Time: 1400-1445 PT Individual Time Calculation (min): 45 min   Short Term Goals: Week 1:  PT Short Term Goal 1 (Week 1): STG=LTG due to ELOS  Skilled Therapeutic Interventions/Progress Updates: Pt presented in bed agreeable to therapy. Pt indicating does not feel need for therapy and would like to go home and "let things come back as they will". Provided pt edu on benefits/role of therapy to improve overall functional mobility. Pt performed supine to sit at EOB mod I with use of features. Pt ambulated room to rehab gym no AD with initially modA fading to minA. Pt noted to have intermittent scissoring step and x 2 LOB to left requiring minA from PTA for correction. Pt participated in Berg Balance Test achieving a score of 42/56 (see results below)indicating currently increased risk of falls and pt would benefit currently from use of AD. Discussed findings with pt who indicated would currently prefer RW  "so I can go home". Pt transferred to w/c stand pivot with min guard and propelled w/c back to room with use of all extremities for endurance and cardiovascular strengthening. Pt returned to bed in same manner as prior and left in bed at end of session with bed alarm on, call bell within reach and needs met.      Therapy Documentation Precautions:  Precautions Precautions: Fall Precaution Comments: left sided ataxia and left lateral lean Restrictions Weight Bearing Restrictions: No General:   Vital Signs:  Pain:   Mobility:   Locomotion :    Trunk/Postural Assessment :    Balance: Balance Balance Assessed: Yes Standardized Balance Assessment Standardized Balance Assessment: Berg Balance Test Berg Balance Test Sit to Stand: Able to stand  independently using hands Standing Unsupported: Able to stand safely 2 minutes Sitting with  Back Unsupported but Feet Supported on Floor or Stool: Able to sit safely and securely 2 minutes Stand to Sit: Controls descent by using hands Transfers: Able to transfer safely, definite need of hands Standing Unsupported with Eyes Closed: Able to stand 10 seconds safely Standing Ubsupported with Feet Together: Able to place feet together independently and stand for 1 minute with supervision From Standing, Reach Forward with Outstretched Arm: Can reach forward >12 cm safely (5") From Standing Position, Pick up Object from Floor: Able to pick up shoe, needs supervision From Standing Position, Turn to Look Behind Over each Shoulder: Looks behind one side only/other side shows less weight shift Turn 360 Degrees: Able to turn 360 degrees safely but slowly Standing Unsupported, Alternately Place Feet on Step/Stool: Able to stand independently and complete 8 steps >20 seconds Standing Unsupported, One Foot in Front: Able to plae foot ahead of the other independently and hold 30 seconds Standing on One Leg: Tries to lift leg/unable to hold 3 seconds but remains standing independently Total Score: 42   See Function Navigator for Current Functional Status.   Therapy/Group: Individual Therapy      , PTA  01/27/2018, 3:13 PM  

## 2018-01-27 NOTE — Progress Notes (Signed)
Patient information reviewed and entered into eRehab system by Daiva Nakayama, RN, CRRN, [place]na Coordinator.  Information including medical coding and functional independence measure will be reviewed and updated through discharge.     Per nursing patient was given "Data Collection Information Summary for Patients in Inpatient Rehabilitation Facilities with attached "Privacy Act [place] Records" upon admission.  Present in patient's health resource notebook.

## 2018-01-28 ENCOUNTER — Inpatient Hospital Stay (HOSPITAL_COMMUNITY): Payer: PPO | Admitting: Physical Therapy

## 2018-01-28 ENCOUNTER — Inpatient Hospital Stay (HOSPITAL_COMMUNITY): Payer: PPO | Admitting: Occupational Therapy

## 2018-01-28 MED ORDER — ATORVASTATIN CALCIUM 40 MG PO TABS: 40.0000 mg | ORAL_TABLET | Freq: Every day | ORAL | 0 refills | Status: DC

## 2018-01-28 MED ORDER — MECLIZINE HCL 25 MG PO TABS: 25.0000 mg | ORAL_TABLET | Freq: Three times a day (TID) | ORAL | 0 refills | Status: DC | PRN

## 2018-01-28 MED ORDER — POLYETHYLENE GLYCOL 3350 17 G PO PACK: 17.0000 g | PACK | Freq: Every day | ORAL | 0 refills | Status: DC

## 2018-01-28 MED ORDER — AMIODARONE HCL 200 MG PO TABS: ORAL_TABLET | ORAL | 1 refills | Status: DC

## 2018-01-28 MED ORDER — TAMSULOSIN HCL 0.4 MG PO CAPS: 0.4000 mg | ORAL_CAPSULE | Freq: Every day | ORAL | 3 refills | Status: DC

## 2018-01-28 MED ORDER — CARVEDILOL 3.125 MG PO TABS: 3.1250 mg | ORAL_TABLET | Freq: Two times a day (BID) | ORAL | 3 refills | Status: DC

## 2018-01-28 MED ORDER — AMIODARONE HCL 200 MG PO TABS
200.0000 mg | ORAL_TABLET | Freq: Two times a day (BID) | ORAL | Status: DC
  Administered 2018-01-28 – 2018-02-01 (×8): 200 mg via ORAL
  Filled 2018-01-28 (×9): qty 1

## 2018-01-28 MED ORDER — APIXABAN 5 MG PO TABS
5.0000 mg | ORAL_TABLET | Freq: Two times a day (BID) | ORAL | Status: DC
  Administered 2018-01-28 – 2018-02-01 (×8): 5 mg via ORAL
  Filled 2018-01-28 (×9): qty 1

## 2018-01-28 MED ORDER — APIXABAN 5 MG PO TABS: 5.0000 mg | ORAL_TABLET | Freq: Two times a day (BID) | ORAL | 1 refills | Status: DC

## 2018-01-28 MED ORDER — FUROSEMIDE 20 MG PO TABS
20.0000 mg | ORAL_TABLET | Freq: Every day | ORAL | Status: DC
  Administered 2018-01-28 – 2018-02-01 (×5): 20 mg via ORAL
  Filled 2018-01-28 (×5): qty 1

## 2018-01-28 MED ORDER — FUROSEMIDE 20 MG PO TABS: 20.0000 mg | ORAL_TABLET | Freq: Every day | ORAL | 1 refills | Status: DC

## 2018-01-28 MED ORDER — LOSARTAN POTASSIUM 25 MG PO TABS: 12.5000 mg | ORAL_TABLET | Freq: Every day | ORAL | 3 refills | Status: DC

## 2018-01-28 MED ORDER — LOSARTAN POTASSIUM 25 MG PO TABS
12.5000 mg | ORAL_TABLET | Freq: Every day | ORAL | Status: DC
  Administered 2018-01-28 – 2018-02-01 (×5): 12.5 mg via ORAL
  Filled 2018-01-28 (×5): qty 0.5

## 2018-01-28 NOTE — Progress Notes (Signed)
Physical Therapy Session Note  Patient Details  Name: INIOLUWA BARIS MRN: [id number] Date of Birth: August 22, 1931  Today's Date: 01/28/2018 PT Individual Time: 1100-1150 PT Individual Time Calculation (min): 50 min   Short Term Goals: Week 1:  PT Short Term Goal 1 (Week 1): STG=LTG due to ELOS  Skilled Therapeutic Interventions/Progress Updates:   Session 1:  Pt in supine and agreeable to therapy, denies pain. Session focused on functional balance and gait training. Pt transferred to EOB and therapist provided set-up assist to don shoes and socks w/ increased time. Ambulated to therapy gym w/ RW and close supervision. Performed dynamic standing balance tasks both on firm and foam surfaces w/o UE support w/ emphasis on lateral weight shifting, reaching outside of BOS, and anticipatory balance reactions to perturbations. Close supervision to min guard w/ verbal and visual cues for balance strategies. Practiced ambulating w/o AD w/ min guard, 150' x2 w/ verbal cues for gait pattern and performed dynamic gait w/ environmental scanning and picking up objects from floor. Performed limits of stability training on Biodex w/ UE support as well, 40% accuracy improving to 50% after 3 trials. Pt w/ increased awareness of deficits this session and seems more agreeable to participating in this therapist's interventions. Returned to room and ended session in supine, call bell within reach and all needs met.   Session 2:  Pt received in therapy gym after OT, denies pain. Session focused on dynamic gait and NMR. Pt ambulated around unit w/ min assist w/o AD, >150' at a time. Performed gait requiring negotiating and stepping over objects, verbal cues for technique and safety awareness. Additionally performed gait w/ alternating speeds, head turns, and backwards walking, mod assist to maintain balance during these activities. Performed NuStep 10 min @ level 3 for LE strengthening in reciprocal movement pattern. Had  discussions throughout session regarding stroke risk factors, activity and lifestyle modification, and specific impairments related to pt's stroke. Again, pt seemed more aware of deficits today vs yesterday. Returned to room and ended session in supine, call bell within reach and all needs met.   Therapy Documentation Precautions:  Precautions Precautions: Fall Precaution Comments: left sided ataxia and left lateral lean Restrictions Weight Bearing Restrictions: No  See Function Navigator for Current Functional Status.   Therapy/Group: Individual Therapy  Chaysen Tillman K Arnette 01/28/2018, 2:05 PM

## 2018-01-28 NOTE — Progress Notes (Signed)
Occupational Therapy Session Note  Patient Details  Name: Patrick Maxwell MRN: [id number] Date of Birth: [birth year]  Today's Date: 01/28/2018  Session 1 OT Individual Time: 0900-1000 OT Individual Time Calculation (min): 60 min   Session 2 OT Individual Time: 1415-1500 OT Individual Time Calculation (min): 45 min    Short Term Goals: Week 1:  OT Short Term Goal 1 (Week 1): STG=LTG 2/2 ELOS  Skilled Therapeutic Interventions/Progress Updates:  Session 1   Pt greeted semi-reclined in bed with spouse present and agreeable to OT. Pt came to sitting EOB, donned pants w/ supervision sit<>stand and 1 posterior slight LOB requiring CGA to correct. Pt donned socks and shoes sitting EOB supervision. Discussed balance deficits with pt and he stated "it's because I didn't have my shoes on." Educated pt that at home, he may not always have his shoes on and balance is an important safety factor within daily activities, but agreed. Pt reported need for bathroom so ambulated with RW and min cues for hand placement with sit<>stand. Supervision for 3/3 toileting steps after successful BM. Shaving and toothbrushing task completed standing at the sink for 10 min with 90% supervision and intermittent min guard A for posterior lean. Reviewed safe RW placement when standing at the sink. Pt took seated rest break, then donned shirt. Pt ambulated to therapy apartment with RW and supervision. Set-up simulated walk-in-shower transfer and discussed home DME needs and safety within BADL tasks. Pt practiced stepping over ledge with supervision/CGA assist. Pt ambulated back to room in similar fashion and left semi-reclined in bed with bed alarm on and needs met.   Session 2 Pt greeted asleep in bed, easy to wake and agreeable to OT. Pt came to sitting EOB and donned shoes with set-up A. Pt then ambulated to therapy apartment with RW and 75% supervision and intermittent GCA when turning corners. Worked in dynamic standing  balance and UE coordination with horse shoe toss activity. Incorporated step and follow through for increased balance challenge. CGA for balance overall. Progressed to standing on foam wedge and worked on reaching outside base of support for hip and ankle balance strategy activity- min guard A for balance overall. Pt left in gym at end of session for hand off to PT.  Therapy Documentation Precautions:  Precautions Precautions: Fall Precaution Comments: left sided ataxia and left lateral lean Restrictions Weight Bearing Restrictions: No Pain:  none/denies pain ADL: ADL ADL Comments: Please see functional navigator  See Function Navigator for Current Functional Status.   Therapy/Group: Individual Therapy  Valma Cava 01/28/2018, 3:50 PM

## 2018-01-28 NOTE — Progress Notes (Signed)
Subjective/Complaints:  Pt wants to go home tomorrow.  Discussed the reduced amt of therapy outpt vs inpt Appreciate cardiology F/u  ROS: neg CP, SOB, N/V/D Objective: Vital Signs: Blood pressure 123/73, pulse 83, temperature 98.3 F (36.8 C), temperature source Oral, resp. rate 18, height 5' 10" (1.778 m), weight 86 kg (189 lb 9.5 oz), SpO2 99 %. No results found. Results for orders placed or performed during the hospital encounter of 01/26/18 (from the past 72 hour(s))  CBC WITH DIFFERENTIAL     Status: Abnormal   Collection Time: 01/27/18  6:01 AM  Result Value Ref Range   WBC 6.4 4.0 - 10.5 K/uL   RBC 4.69 4.22 - 5.81 MIL/uL   Hemoglobin 13.4 13.0 - 17.0 g/dL   HCT 40.1 39.0 - 52.0 %   MCV 85.5 78.0 - 100.0 fL   MCH 28.6 26.0 - 34.0 pg   MCHC 33.4 30.0 - 36.0 g/dL   RDW 15.7 (H) 11.5 - 15.5 %   Platelets 125 (L) 150 - 400 K/uL   Neutrophils Relative % 68 %   Neutro Abs 4.4 1.7 - 7.7 K/uL   Lymphocytes Relative 20 %   Lymphs Abs 1.2 0.7 - 4.0 K/uL   Monocytes Relative 8 %   Monocytes Absolute 0.5 0.1 - 1.0 K/uL   Eosinophils Relative 4 %   Eosinophils Absolute 0.3 0.0 - 0.7 K/uL   Basophils Relative 0 %   Basophils Absolute 0.0 0.0 - 0.1 K/uL    Comment: Performed at [facility], [address], [place], [place] [address]  Comprehensive metabolic panel     Status: Abnormal   Collection Time: 01/27/18  6:01 AM  Result Value Ref Range   Sodium 138 135 - 145 mmol/L   Potassium 4.2 3.5 - 5.1 mmol/L   Chloride 108 101 - 111 mmol/L   CO2 23 22 - 32 mmol/L   Glucose, Bld 104 (H) 65 - 99 mg/dL   BUN 18 6 - 20 mg/dL   Creatinine, Ser 1.15 0.61 - 1.24 mg/dL   Calcium 8.5 (L) 8.9 - 10.3 mg/dL   Total Protein 5.3 (L) 6.5 - 8.1 g/dL   Albumin 3.0 (L) 3.5 - 5.0 g/dL   AST 33 15 - 41 U/L   ALT 40 17 - 63 U/L   Alkaline Phosphatase 75 38 - 126 U/L   Total Bilirubin 1.1 0.3 - 1.2 mg/dL   GFR calc non Af Amer 57 (L) >60 mL/min   GFR calc Af Amer >60 >60 mL/min   Comment: (NOTE) The eGFR has been calculated using the CKD EPI equation. This calculation has not been validated in all clinical situations. eGFR's persistently <60 mL/min signify possible Chronic Kidney Disease.    Anion gap 7 5 - 15    Comment: Performed at [facility] [address], [place], [place] [address]     HEENT: normal and no facial dysesthesia Cardio: RRR and no murmur Resp: CTA B/L and Unlabored GI: BS positive and NT, ND Extremity:  No Edema Skin:   Intact Neuro: Alert/Oriented, Normal Sensory, Normal Motor, Abnormal FMC Ataxic/ dec FMC and Other Limb ataxia, dysmetria Left finger nose finger and left heel to shin Musc/Skel:  Normal and Other no pain with UE or LE ROM Gen NAD   Assessment/Plan: 1. 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 Physiatrist is providing  close team supervision and 24 hour management of active medical problems listed below. Physiatrist and rehab team continue to assess barriers to discharge/monitor patient progress toward functional and medical goals. FIM: Function - Bathing Position: Shower Body parts bathed by patient: Right arm, Left arm, Abdomen, Chest, Front perineal area, Buttocks, Left lower leg, Left upper leg, Right upper leg, Right lower leg Assist Level: Touching or steadying assistance(Pt > 75%)  Function- Upper Body Dressing/Undressing What is the patient wearing?: Pull over shirt/dress Pull over shirt/dress - Perfomed by patient: Thread/unthread right sleeve, Thread/unthread left sleeve, Put head through opening, Pull shirt over trunk Assist Level: Supervision or verbal cues Function - Lower Body Dressing/Undressing What is the patient wearing?: Pants, Non-skid slipper socks, Underwear Position: Wheelchair/chair at sink Underwear - Performed by patient: Thread/unthread right  underwear leg, Thread/unthread left underwear leg, Pull underwear up/down Pants- Performed by patient: Thread/unthread right pants leg, Thread/unthread left pants leg, Pull pants up/down Non-skid slipper socks- Performed by patient: Don/doff left sock, Don/doff right sock Assist for footwear: Supervision/touching assist Assist for lower body dressing: Touching or steadying assistance (Pt > 75%)  Function - Toileting Toileting steps completed by patient: Adjust clothing prior to toileting, Performs perineal hygiene, Adjust clothing after toileting Toileting Assistive Devices: Grab bar or rail Assist level: Touching or steadying assistance (Pt.75%)  Function - Air cabin crew transfer assistive device: Grab bar Assist level to toilet: Touching or steadying assistance (Pt > 75%) Assist level from toilet: Touching or steadying assistance (Pt > 75%)  Function - Chair/bed transfer Chair/bed transfer method: Stand pivot Chair/bed transfer assist level: Touching or steadying assistance (Pt > 75%) Chair/bed transfer assistive device: Armrests Chair/bed transfer details: Verbal cues for technique, Verbal cues for precautions/safety  Function - Locomotion: Wheelchair Will patient use wheelchair at discharge?: No Function - Locomotion: Ambulation Assistive device: No device Max distance: 150 ft Assist level: Moderate assist (Pt 50 - 74%) Assist level: Moderate assist (Pt 50 - 74%) Assist level: Moderate assist (Pt 50 - 74%) Assist level: Moderate assist (Pt 50 - 74%) Assist level: Moderate assist (Pt 50 - 74%)  Function - Comprehension Comprehension: Auditory Comprehension assist level: Follows complex conversation/direction with no assist  Function - Expression Expression: Verbal Expression assist level: Expresses complex ideas: With no assist  Function - Social Interaction Social Interaction assist level: Interacts appropriately with others - No medications needed.  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 DVT Prophylaxis/Anticoagulation: Subcutaneous heparin change to Lovenox. Monitor for any bleeding episodes 3. Pain Management:Tylenol as needed 4. Mood:Provide emotional support 5. Neuropsych: This patientiscapable of making decisions on hisown behalf. 6. Skin/Wound Care:Routine skin checks 7. Fluids/Electrolytes/Nutrition:Routine in and outs with follow-up chemistries wnl except hypoalbuminemia 8.Chronic systolic congestive heart failure. Monitor for any signs of fluid overload 9.Hypertension. Coreg 3.125 mg twice daily- Controlled Vitals:   01/27/18 1525 01/28/18 0609  BP: (!) 120/58 123/73  Pulse: (!) 40 83  Resp: 16 18  Temp: 97.8 F (36.6  C) 98.3 F (36.8 C)  SpO2: 96% 99%  10.History of bradycardia and PVC. Patient asymptomatic. Continue Coreg.Amiodarone started per Dr Acie Fredrickson Follow cardiology services as out pt 11.CKD stage II. Follow-up chemistries norma      l12.Hyperlipidemia. Lipitor 13.BPH. Flomax 0.4 mg daily. Check PVR x3 14.Constipation. Laxative assistance    LOS (Days) 2 A FACE TO FACE EVALUATION WAS PERFORMED  Charlett Blake 01/28/2018, 8:13 AM

## 2018-01-28 NOTE — Progress Notes (Signed)
Patient refused bedtime medications after being educated on their necessity.

## 2018-01-28 NOTE — IPOC Note (Signed)
Overall Plan of Care [facility]) Patient Details Name: KODEE RAVERT MRN: [id number] DOB: 05/03/1931  Admitting Diagnosis: <principal problem not specified>  Hospital Problems: Active Problems:   Cerebral infarction involving cerebellar artery ([place])     Functional Problem List: Nursing Medication Management, Edema, Endurance, Nutrition, Safety  PT Balance, Sensory, Endurance, Motor, Nutrition, Safety  OT Balance, Endurance, Motor, Safety  SLP    TR         Basic ADL's: OT Grooming, Bathing, Toileting, Dressing     Advanced  ADL's: OT       Transfers: PT Bed Mobility, Bed to Chair, Car, [employer], Futures trader, Metallurgist: PT Ambulation, Emergency planning/management officer, Stairs     Additional Impairments: OT Fuctional Use of Upper Extremity  SLP        TR      Anticipated Outcomes Item Anticipated Outcome  Self Feeding    Swallowing      Basic self-care  Mod I  Toileting  Mod I   Bathroom Transfers Mod I  Bowel/Bladder  Supervision  Transfers  Mod I  Locomotion  supervision  Communication     Cognition     Pain  <3 on a 0-10 pain scale  Safety/Judgment  Modified independent with assistance of rolling walker   Therapy Plan: PT Intensity: Minimum of 1-2 x/day ,45 to 90 minutes PT Frequency: 5 out of 7 days PT Duration Estimated Length of Stay: 7-9 days OT Intensity: Minimum of 1-2 x/day, 45 to 90 minutes OT Frequency: 5 out of 7 days OT Duration/Estimated Length of Stay: 7-10 days      Team Interventions: Nursing Interventions Patient/Family Education, Pain Management, Psychosocial Support, Medication Management, Discharge Planning, Disease Management/Prevention  PT interventions Discharge planning, Ambulation/gait training, Functional mobility training, Therapeutic Activities, Visual/perceptual remediation/compensation, Training and development officer, Disease management/prevention, Neuromuscular re-education, Therapeutic Exercise,  Wheelchair propulsion/positioning, Cognitive remediation/compensation, DME/adaptive equipment instruction, Pain management, Splinting/orthotics, UE/LE Strength taining/ROM, Community reintegration, Technical sales engineer stimulation, Barrister's clerk education, IT trainer, UE/LE Coordination activities  OT Interventions Training and development officer, Academic librarian, Discharge planning, Patient/family education, Neuromuscular re-education, Functional mobility training, Self Care/advanced ADL retraining, Therapeutic Activities, Therapeutic Exercise, UE/LE Coordination activities, UE/LE Strength taining/ROM  SLP Interventions    TR Interventions    SW/CM Interventions Discharge Planning, Psychosocial Support, Patient/Family Education   Barriers to Discharge MD  Medical stability and balance and safety awareness  Nursing Medical stability, Home environment access/layout    PT      OT      SLP      SW       Team Discharge Planning: Destination: PT-Home ,OT- Home , SLP-  Projected Follow-up: PT-Outpatient PT, OT-  Outpatient OT, SLP-  Projected Equipment Needs: PT- , OT- To be determined, SLP-  Equipment Details: PT-tbd, OT-  Patient/family involved in discharge planning: PT- Patient, Family member/caregiver,  OT-Patient, SLP-   MD ELOS: 7d Medical Rehab Prognosis:  Excellent Assessment:  84 year old right-handed male with history of CVA 2009 maintained on aspirin, hypertension, chronic systolic congestive heart failure, CKD stage II, hyperlipidemia, CAD, bradycardia with PVCs. Per chart review patient lives with spouse. Independent prior to admission. One level home with 4 steps to entry. Presented 01/21/2018 with vertigo and tingling on his left side. He was noted to be ataxic. He presented to the ED. MRI showed left cerebellar infarction. 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  CT angiogram negative for large vessel occlusion, left PICA partial thrombosis. Echocardiogram with ejection fraction of 30 to 35% with diffuse hypokinesis. Neurology follow-up currently maintained on aspirin as well as the addition of Plavix for CVA prophylaxis x3 weeks then Plavix alone. A repeat echocardiogram was completed 01/25/2018 to rule out apical thrombus that was negative/no cardiac source of emboli identified. There was noted diffuse hypokinesis ejection fraction was 25%. Subcutaneousheparin for DVT prophylaxis. TEE completed 01/26/2018 showing no thrombus, ejection fraction 20 to 25%. Severe global hypokinesis. No plan forloop recorder placement .     See Team Conference Notes for weekly updates to the plan of care

## 2018-01-28 NOTE — Progress Notes (Signed)
Social Work Patient ID: Stepan R Hove, male   DOB: 12/15/1930, 84 y.o.   MRN: [id number]   CSW met with pt 01-27-18 to update him on team conference discussion and then called his wife to update her.  Pt feels he can go home, but wife feels he needs to stay to get to supervision/mod I level.  Encouraged pt's wife to come in for therapies so she could see his level of care needed and we could talk with him together.  Wife observed PT 01-27-18 and OT 01-28-18 and still feels strongly about pt staying until targeted d/c date of 02-03-18.  CSW and wife and pt met in the room this morning and knowing that pt wants to go home, we discussed the benefits of pt finishing the rehab program.  Pt acknowledged that lying in bed he feels he's fine until he goes to stand up/walk and then he realizes his deficits.  Pt has agreed to complete rehab program through the weekend and we will re-evaluate his progress on Monday and go from there.  CSW updated nurse, therapists, PA/MD, and all parties seemed satisfied with this plan.  Pt was disappointed, but understands rationale for staying.  CSW will continue to follow and assist as needed.  

## 2018-01-28 NOTE — Progress Notes (Signed)
Social Work Assessment and Plan  Patient Details  Name: Patrick Maxwell MRN: [id number] Date of Birth: 18-Jun-1931  Today's Date: 01/27/2018  Problem List:  Patient Active Problem List   Diagnosis Date Noted  . Cerebral infarction involving cerebellar artery ([place]) 01/26/2018  . Ischemic cardiomyopathy   . Cerebellar infarct ([place])   . Leukocytosis   . Prediabetes   . Acute CVA (cerebrovascular accident) ([place]) 01/21/2018  . Bradycardia 04/20/2017  . BPH with elevated PSA 01/06/2017  . PVC's (premature ventricular contractions) 01/23/2016  . Coronary artery disease involving native coronary artery of native heart with unstable angina pectoris ([place])   . Acute combined systolic and diastolic congestive heart failure ([place])   . Angina decubitus ([place])   . CHF (congestive heart failure) ([place]) 01/21/2016  . Palpitations 12/25/2015  . Subclinical hypothyroidism 12/21/2015  . Hypercholesteremia 01/17/2011  . History of CVA (cerebrovascular accident) without residual deficits 12/20/2010  . Hypertension, benign 12/20/2010   Past Medical History:  Past Medical History:  Diagnosis Date  . CAD (coronary artery disease)   . CHF (congestive heart failure) ([place])   . Frequent unifocal PVCs    Palpitations since early 20s, PVCs and pairs  . Hyperlipidemia   . Hypertension   . Stroke [facility]) 2009   Past Surgical History:  Past Surgical History:  Procedure Laterality Date  . CARDIAC CATHETERIZATION N/A 01/23/2016   Procedure: Right/Left Heart Cath and Coronary Angiography;  Surgeon: Burnell Blanks, MD;  Location: [facility];  Service: Cardiovascular;  Laterality: N/A;  . CARDIAC CATHETERIZATION N/A 01/23/2016   Procedure: Coronary Stent Intervention;  Surgeon: Burnell Blanks, MD;  Location: [facility];  Service: Cardiovascular;  Laterality: N/A;  . HERNIA REPAIR  06/1992   Social History:  reports that he has never smoked. He has never used smokeless tobacco. He  reports that he does not drink alcohol or use drugs.  Family / Support Systems Marital Status: Married How Long?: 81 years Patient Roles: Spouse, Parent Spouse/Significant Other: Qamar Aughenbaugh - wife - [phone] Children: 5 children (Josh and Raquel Sarna are closest in proximity to pt/wife); 30 grandchildren; 6 great grandchildren Other Supports: extended family Anticipated Caregiver: wife Ability/Limitations of Caregiver: Wife with bad back and COPD, but not limitations at supervision level of care Caregiver Availability: 24/7 Family Dynamics: close, supportive family/friends  Social History Preferred language: English Religion: Mormon - does not attend services, but is still very spiritual Read: Yes Write: Yes Employment Status: Retired(local truck driver) Date Retired/Disabled/Unemployed: 60 Age Retired: 14 Legal History/Current Legal Issues: none reported Guardian/Conservator: N/A - MD has determined that pt is capable of making his own decisions.   Abuse/Neglect Abuse/Neglect Assessment Can Be Completed: Yes Physical Abuse: Denies Verbal Abuse: Denies Sexual Abuse: Denies Exploitation of patient/patient's resources: Denies Self-Neglect: Denies  Emotional Status Pt's affect, behavior and adjustment status: Pt is frustrated to still be in the hospital and wants to go home, but is motivated to get better and get back to his life. Recent Psychosocial Issues: none reported Psychiatric History: none reported Substance Abuse History: none reported  Patient / Family Perceptions, Expectations & Goals Pt/Family understanding of illness & functional limitations: Pt/wife have a good understanding of pt's condition and limitations. Premorbid pt/family roles/activities: Pt likes to work in the yard, fill his bird feeders and watch the birds, spend time with his family. Anticipated changes in roles/activities/participation: Pt plans to resume these things as soon as  possible. Pt/family expectations/goals: Pt wants to get "  home to my family and get my life back."  [employer]: None Premorbid Home Care/DME Agencies: None Transportation available at discharge: wife Resource referrals recommended: Neuropsychology, Support group (specify)  Discharge Planning Living Arrangements: Spouse/significant other Support Systems: Spouse/significant other, Children, Other relatives, Friends/neighbors, Social worker community Type of Residence: Private residence Insurance Resources: Multimedia programmer (specify)([employer]) Financial Resources: Radio broadcast assistant Screen Referred: No Money Management: Patient, Spouse Does the patient have any problems obtaining your medications?: No Home Management: Pt and wife share household responsibilities. Patient/Family Preliminary Plans: Pt plans to return home to his home with his wife to provide 24/7 supervision. Social Work Anticipated Follow Up Needs: HH/OP, Support Group Expected length of stay: 7 to 10 days  Clinical Impression CSW met with pt to introduce self and role of CSW, as well as to complete assessment.  Pt is eager to get home and states that he knows how long it takes to recover from a stroke and he could be doing that at home.  CSW explained that this is the most therapy pt will get and that he should try to complete the program, but that CSW would check with team to see if he could potentially be ready sooner.  CSW also called pt's wife who is interested in pt completing the rehab program and staying through to the targeted d/c date of 02-03-18.  CSW recommended that the three of [place] sit down and talk and will do so on 01-28-18.  Pt is very appreciative of the care and the therapy he has received thus far, however.  CSW will continue to follow and assist as needed.  Greycen Felter, Silvestre Mesi 01/28/2018, 12:42 PM

## 2018-01-29 ENCOUNTER — Inpatient Hospital Stay (HOSPITAL_COMMUNITY): Payer: PPO | Admitting: Occupational Therapy

## 2018-01-29 ENCOUNTER — Inpatient Hospital Stay (HOSPITAL_COMMUNITY): Payer: PPO | Admitting: Physical Therapy

## 2018-01-29 ENCOUNTER — Encounter (HOSPITAL_COMMUNITY): Payer: Self-pay | Admitting: Internal Medicine

## 2018-01-29 NOTE — Progress Notes (Signed)
Subjective/Complaints:  Discussed amiodarone load with cardiology yesterday and importance of BID dosing for amiodarone and Eliquis with pt today (refused pm doses yesterday) Discussed D/C date of 5/13  ROS: neg CP, SOB, N/V/D Objective: Vital Signs: Blood pressure 99/61, pulse (!) 41, temperature 97.6 F (36.4 C), temperature source Oral, resp. rate 17, height _0  (1.778 m), weight 86.2 kg (190 lb 0.6 oz), SpO2 96 %. No results found. Results for orders placed or performed during the hospital encounter of 01/26/18 (from the past 72 hour(s))  CBC WITH DIFFERENTIAL     Status: Abnormal   Collection Time: 01/27/18  6:01 AM  Result Value Ref Range   WBC 6.4 4.0 - 10.5 K/uL   RBC 4.69 4.22 - 5.81 MIL/uL   Hemoglobin 13.4 13.0 - 17.0 g/dL   HCT 40.1 39.0 - 52.0 %   MCV 85.5 78.0 - 100.0 fL   MCH 28.6 26.0 - 34.0 pg   MCHC 33.4 30.0 - 36.0 g/dL   RDW 15.7 (H) 11.5 - 15.5 %   Platelets 125 (L) 150 - 400 K/uL   Neutrophils Relative % 68 %   Neutro Abs 4.4 1.7 - 7.7 K/uL   Lymphocytes Relative 20 %   Lymphs Abs 1.2 0.7 - 4.0 K/uL   Monocytes Relative 8 %   Monocytes Absolute 0.5 0.1 - 1.0 K/uL   Eosinophils Relative 4 %   Eosinophils Absolute 0.3 0.0 - 0.7 K/uL   Basophils Relative 0 %   Basophils Absolute 0.0 0.0 - 0.1 K/uL    Comment: Performed at [facility], [address], [place], [place] [address]  Comprehensive metabolic panel     Status: Abnormal   Collection Time: 01/27/18  6:01 AM  Result Value Ref Range   Sodium 138 135 - 145 mmol/L   Potassium 4.2 3.5 - 5.1 mmol/L   Chloride 108 101 - 111 mmol/L   CO2 23 22 - 32 mmol/L   Glucose, Bld 104 (H) 65 - 99 mg/dL   BUN 18 6 - 20 mg/dL   Creatinine, Ser 1.15 0.61 - 1.24 mg/dL   Calcium 8.5 (L) 8.9 - 10.3 mg/dL   Total Protein 5.3 (L) 6.5 - 8.1 g/dL   Albumin 3.0 (L) 3.5 - 5.0 g/dL   AST 33 15 - 41 U/L   ALT 40 17 - 63 U/L   Alkaline Phosphatase 75 38 - 126 U/L   Total Bilirubin 1.1 0.3 - 1.2 mg/dL   GFR calc  non Af Amer 57 (L) >60 mL/min   GFR calc Af Amer >60 >60 mL/min    Comment: (NOTE) The eGFR has been calculated using the CKD EPI equation. This calculation has not been validated in all clinical situations. eGFR's persistently <60 mL/min signify possible Chronic Kidney Disease.    Anion gap 7 5 - 15    Comment: Performed at [facility] [address], [place], [place] [address]     HEENT: normal and no facial dysesthesia Cardio: RRR and no murmur Resp: CTA B/L and Unlabored GI: BS positive and NT, ND Extremity:  No Edema Skin:   Intact Neuro: Alert/Oriented, Normal Sensory, Normal Motor, Abnormal FMC Ataxic/ dec FMC and Other Limb ataxia, dysmetria Left finger nose finger and left heel to shin Musc/Skel:  Normal and Other no pain with UE or LE ROM Gen NAD   Assessment/Plan: 1. 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 Physiatrist is providing close team supervision and 24 hour management of active medical problems listed below. Physiatrist and rehab team continue to assess barriers to discharge/monitor patient progress toward functional and medical goals. FIM: Function - Bathing Position: Shower Body parts bathed by patient: Right arm, Left arm, Abdomen, Chest, Front perineal area, Buttocks, Left lower leg, Left upper leg, Right upper leg, Right lower leg Assist Level: Touching or steadying assistance(Pt > 75%)  Function- Upper Body Dressing/Undressing What is the patient wearing?: Pull over shirt/dress Pull over shirt/dress - Perfomed by patient: Thread/unthread right sleeve, Thread/unthread left sleeve, Put head through opening, Pull shirt over trunk Assist Level: Supervision or verbal cues Function - Lower Body Dressing/Undressing What is the patient wearing?: Pants, Non-skid slipper socks, Underwear Position: Wheelchair/chair  at sink Underwear - Performed by patient: Thread/unthread right underwear leg, Thread/unthread left underwear leg, Pull underwear up/down Pants- Performed by patient: Thread/unthread right pants leg, Thread/unthread left pants leg, Pull pants up/down Non-skid slipper socks- Performed by patient: Don/doff left sock, Don/doff right sock Assist for footwear: Supervision/touching assist Assist for lower body dressing: Touching or steadying assistance (Pt > 75%)  Function - Toileting Toileting steps completed by patient: Adjust clothing prior to toileting, Performs perineal hygiene, Adjust clothing after toileting Toileting Assistive Devices: Grab bar or rail Assist level: Touching or steadying assistance (Pt.75%)  Function - Air cabin crew transfer assistive device: Grab bar Assist level to toilet: Touching or steadying assistance (Pt > 75%) Assist level from toilet: Touching or steadying assistance (Pt > 75%)  Function - Chair/bed transfer Chair/bed transfer method: Stand pivot, Ambulatory Chair/bed transfer assist level: Supervision or verbal cues Chair/bed transfer assistive device: Armrests, Walker Chair/bed transfer details: Verbal cues for technique, Verbal cues for precautions/safety  Function - Locomotion: Wheelchair Will patient use wheelchair at discharge?: No Function - Locomotion: Ambulation Assistive device: No device Max distance: 150 ft Assist level: Touching or steadying assistance (Pt > 75%) Assist level: Touching or steadying assistance (Pt > 75%) Assist level: Touching or steadying assistance (Pt > 75%) Assist level: Touching or steadying assistance (Pt > 75%) Assist level: Moderate assist (Pt 50 - 74%)  Function - Comprehension Comprehension: Auditory Comprehension assist level: Follows complex conversation/direction with no assist  Function - Expression Expression: Verbal Expression assist level: Expresses complex ideas: With no assist  Function -  Social Interaction Social Interaction assist level: Interacts appropriately with others - No medications needed.  Function - Problem Solving Problem solving assist level: Solves complex problems: With extra time  Function - Memory Memory assist level: Recognizes or recalls 75 - 89% of the time/requires cueing 10 - 24% of the time Patient normally able to recall (first 3 days only): Current season, Location of own room, Staff names and faces, That he or she is in a hospital  Medical Problem List and Plan: 1.Left hemiparesis withtruncalataxiasecondary to acute left posterior inferior cerebellar artery infarction -CIR PT, OT, SLP- tent d/c 5/13- needs therapy over the weekend  2. DVT Prophylaxis/Anticoagulation: Subcutaneous heparin change to Lovenox to improved effectiveness and reduce needle sticks. Monitor for any bleeding episodes 3. Pain Management:Tylenol as needed, no current pain c/os 4. Mood:Provide emotional support 5. Neuropsych: This patientiscapable of making decisions on hisown behalf. 6. Skin/Wound Care:Routine skin checks 7. Fluids/Electrolytes/Nutrition:Routine in and outs with follow-up chemistries wnl except hypoalbuminemia 8.Chronic systolic congestive heart failure. Monitor for any signs of fluid overload 9.Hypertension. Coreg 3.125 mg twice daily- Controlled 5/10-meds per cardiology Vitals:  01/28/18 2144 01/29/18 0527  BP: 118/61 99/61  Pulse: 64 (!) 41  Resp: 17 17  Temp: 98.6 F (37 C) 97.6 F (36.4 C)  SpO2: 98% 96%  10.History of bradycardia and PVC. Patient asymptomatic. Continue Coreg.Amiodarone started per Dr Acie Fredrickson Follow cardiology services as out pt 11.CKD stage II. Follow-up chemistries normal      l12.Hyperlipidemia. Lipitor 13.BPH. Flomax 0.4 mg daily. Check PVR x3 14.Constipation. Laxative assistance    LOS (Days) 3 A FACE TO FACE EVALUATION WAS PERFORMED  Charlett Blake 01/29/2018, 8:05  AM

## 2018-01-29 NOTE — Progress Notes (Signed)
Occupational Therapy Session Note  Patient Details  Name: Patrick Maxwell MRN: [id number] Date of Birth: 10-11-1930  Today's Date: 01/29/2018 OT Individual Time: 1430-1500 OT Individual Time Calculation (min): 30 min    Short Term Goals: Week 1:  OT Short Term Goal 1 (Week 1): STG=LTG 2/2 ELOS  Skilled Therapeutic Interventions/Progress Updates:    Pt seen for OT session focusing on functional ambulation, balance and activity tolerance. Pt in supine upon arrival, denying pain and agreeable to tx session. He donned shoes mod I sitting EOB, refused tying shoes despite shoelaces posing fall hazard. He completed ambulation loop around unit x3 during session. First trial while carrying weighted item using B UEs (guarding assist), second trial while pushing shopping cart (supervision) and third trial while bouncing ball (min A overall, 1 instance of mod A to prevent LOB). Pt easily distracted by external stimuli and several instances of running into environmental obstacles on L when attention divided. Seated rest breaks provided btwn each trial. Pt returned to gym at end of session with hand off to PT.   Therapy Documentation Precautions:  Precautions Precautions: Fall Precaution Comments: left sided ataxia and left lateral lean Restrictions Weight Bearing Restrictions: No Pain:   No/denies pain ADL: ADL ADL Comments: Please see functional navigator  See Function Navigator for Current Functional Status.   Therapy/Group: Individual Therapy  Mariena Meares L 01/29/2018, 8:22 AM

## 2018-01-29 NOTE — Progress Notes (Signed)
Occupational Therapy Session Note  Patient Details  Name: Patrick Maxwell MRN: [id number] Date of Birth: April 01, 1931  Today's Date: 01/29/2018 OT Individual Time: 1305-1400 OT Individual Time Calculation (min): 55 min    Short Term Goals: Week 1:  OT Short Term Goal 1 (Week 1): STG=LTG 2/2 ELOS  Skilled Therapeutic Interventions/Progress Updates:    Pt greeted semi-reclined in bed and agreeable to OT. Pt ambulated to bathroom without AD and CGA, voided bladder and completed 3/3 toileting steps with supervision. Pt then bathed in shower with set-up/supervision. Dressing completed seated at EOB with verbal cues for safety to sit down to thread pant legs. Standing balance/endurance with grooming tasks at the sink overall supervision. Pt ambulated to dayroom with CGA for balance. UB and LB there ex with NuStep activity on level 6 for 15 mins. Pt ambulated back to room and similar fashion and left seated EOB with bed alarm on.   Therapy Documentation Precautions:  Precautions Precautions: Fall Precaution Comments: left sided ataxia and left lateral lean Restrictions Weight Bearing Restrictions: No Pain:  none/denies pain ADL: ADL ADL Comments: Please see functional navigator  See Function Navigator for Current Functional Status.   Therapy/Group: Individual Therapy  Valma Cava 01/29/2018, 1:41 PM

## 2018-01-29 NOTE — Progress Notes (Signed)
Physical Therapy Session Note  Patient Details  Name: Patrick Maxwell MRN: [id number] Date of Birth: September 06, 1931  Today's Date: 01/29/2018 PT Individual Time: 1655-3748 AND 1500-1545 PT Individual Time Calculation (min): 55 min AND 45 min  Short Term Goals: Week 1:  PT Short Term Goal 1 (Week 1): STG=LTG due to ELOS  Skilled Therapeutic Interventions/Progress Updates:   Session 1:  Pt in supine and agreeable to therapy, denies pain. Session focused on functional mobility and community level ambulation. Provided set-up assist to don clothing and brush teeth/hair w/ min guard for dynamic stance w/o UE support. Ambulated >1000' at a time around hospital w/ min guard to close supervision w/o AD. 1-2 LOB pt able to correct w/o physical assist from therapist, verbal cues for precautions/safety in busier environment and w/ negotiating obstacles. Overall ambulating for 30+ minutes w/ 1 rest break, pt ambulates at slow gait speed, suspect that is it self-selected for precaution. No scissoring of gait noted this session, improved from previous sessions with this therapist. Discussed pt's hobbies and building up his endurance to return to regular walking around neighborhood and parks. Pt in agreement. Returned to room and ended session in supine, call bell within reach and all needs met.   Session 2:  Pt received from OT in gym, denies pain. Session focused on functional standing balance and LE NMR. Performed dynamic standing balance task while on foam, matching cards high and low requiring pt to squat or reach up to match card. Close supervision for safety, no overt LOB. Ambulated around unit w/ close supervision w/o AD, >150' at a time w/ emphasis on looking up vs at feet and maintaining balance. Performed kinetron in standing at 50 cm/sec, 30 sec x4 reps. Performed NuStep 10 min @ level 4 for LE strengthening and balance muscle activation in reciprocal movement pattern. Returned to room and ended session  in supine, call bell within reach and all needs met.   Therapy Documentation Precautions:  Precautions Precautions: Fall Precaution Comments: left sided ataxia and left lateral lean Restrictions Weight Bearing Restrictions: No  See Function Navigator for Current Functional Status.   Therapy/Group: Individual Therapy  Kumar Falwell K Arnette 01/29/2018, 9:56 AM

## 2018-01-30 ENCOUNTER — Inpatient Hospital Stay (HOSPITAL_COMMUNITY): Payer: PPO | Admitting: Physical Therapy

## 2018-01-30 ENCOUNTER — Inpatient Hospital Stay (HOSPITAL_COMMUNITY): Payer: PPO | Admitting: Occupational Therapy

## 2018-01-30 NOTE — Progress Notes (Signed)
Physical Therapy Session Note  Patient Details  Name: Patrick Maxwell MRN: [id number] Date of Birth: 05/19/1931  Today's Date: 01/30/2018 PT Individual Time: 1015-1135  AND 1605-1700 PT Individual Time Calculation (min): 80 min AND 55min   Short Term Goals: Week 1:  PT Short Term Goal 1 (Week 1): STG=LTG due to ELOS  Skilled Therapeutic Interventions/Progress Updates:   Pt received supine in bed and agreeable to PT. Supine>sit transfer without assist \  Toileting and dressing with supervision assist. Pt also perform facial, oral, and hand hygiene standing at sink with distant superviison assist. Assist.   Gait training in various environments including cement sidewalk with and without RW 150ft, 300ft, and 200ft; supervision assist from PT. Gait training also instructed in hospital gift shop x 200ft without AD and supervision assist from PT for safety. One near LOB, pt able to correct.   Pt transported to day room in WC.  Wii fit balance training penguin slide x 2 and table tilt x 1. Min assist and moderate cues for improved use of ankle over hip strategy to shift weight.   Pt returned to room and performed stand pivot transfer to bed with supervision assist. Sit>supine completed without and left supine in bed with call bell in reach and all needs met.    Session 2.   Pt received supine in bed and initially declines PT. PT educated pt on need for balance training and then agreeable to some PT.  Supine>sit transfer with out assist.   PT instructed pt in modified Level A and Leve B [place] exercise program. Throughout balance training PT provided min assist to prevent L Lateral LOB as well as prevent compensatory movements through trunk. [place] A and B Hand outs provided to pt following balance training for home exercise program. Pt  performed ambulatory transfer to bed with supervision assist. Sit>supine completed without assist, and left supine in bed with call bell in reach and all needs  met.        Therapy Documentation Precautions:  Precautions Precautions: Fall Precaution Comments: left sided ataxia and left lateral lean Restrictions Weight Bearing Restrictions: No General:   Vital Signs: Therapy Vitals Temp: 98.3 F (36.8 C) Temp Source: Oral Pulse Rate: (!) 38 Resp: 19 BP: (!) 114/59 Patient Position (if appropriate): Lying Oxygen Therapy SpO2: 96 % O2 Device: Room Air Pain:   Mobility:   Locomotion :    Trunk/Postural Assessment :    Balance:   Exercises:   Other Treatments:     See Function Navigator for Current Functional Status.   Therapy/Group: Individual Therapy   E  01/30/2018, 10:37 AM  

## 2018-01-30 NOTE — Plan of Care (Signed)
  Problem: RH SAFETY Goal: RH STG ADHERE TO SAFETY PRECAUTIONS W/ASSISTANCE/DEVICE Description STG Adhere to Safety Precautions With Supervision Assistance and appropriate assistive Device.  Outcome: Progressing  Proper Footwear, call light at hand

## 2018-01-30 NOTE — Progress Notes (Signed)
Patient is demanding to be discharge in the morning 01/31/18  and he has equipment and support at home. Please address with patient. He has participated in therapy.

## 2018-01-30 NOTE — Progress Notes (Signed)
Occupational Therapy Session Note  Patient Details  Name: Patrick Maxwell MRN: [id number] Date of Birth: 06-06-1931  Today's Date: 01/30/2018 OT Individual Time: 1300-1320 OT Individual Time Calculation (min): 20 min     Skilled Therapeutic Interventions/Progress Updates:  patient stated he was too fatigued to get out of bed this session and wanted to visit with his son-in-law and grand dtr.    He and his son in law continued asking questions regarding his requirements to leave.   Goals and fnctional OT upon rehab arrival.    This clinician attempted to see patient later in the evening, but he was asleep with all lights in room turned off and his door closed.  Patient is scheduled for next OT session on tomorrow     Therapy Documentation Precautions:  Precautions Precautions: Fall Precaution Comments: left sided ataxia and left lateral lean Restrictions Weight Bearing Restrictions: No General: General OT Amount of Missed Time: 40 Minutes(40)   Therapy/Group: Individual Therapy  Alfredia [facility] 01/30/2018, 10:52 PM

## 2018-01-30 NOTE — Progress Notes (Signed)
Subjective/Complaints:  Patient complaining about the fact that he wants to go home this weekend while his family is "off of work".  Primary doctor had reviewed discharge date of 5/13 with patient on Friday  ROS: Patient denies fever, rash, sore throat, blurred vision, nausea, vomiting, diarrhea, cough, shortness of breath or chest pain, joint or back pain, headache, or mood change.   Objective: Vital Signs: Blood pressure (!) 114/59, pulse (!) 38, temperature 98.3 F (36.8 C), temperature source Oral, resp. rate 19, height 5\' 10"  (1.778 m), weight 86.2 kg (190 lb 0.6 oz), SpO2 96 %. No results found. No results found for this or any previous visit (from the past 72 hour(s)).   Constitutional: No distress . Vital signs reviewed. HEENT: EOMI, oral membranes moist Neck: supple Cardiovascular: RRR without murmur. No JVD    Respiratory: CTA Bilaterally without wheezes or rales. Normal effort    GI: BS +, non-tender, non-distended  Extremity:  No Edema Skin:   Intact Neuro: Alert/Oriented, Normal Sensory, Normal Motor, Abnormal FMC Ataxic/ dec FMC and Other Limb ataxia, dysmetria Left finger nose finger and left heel to shin Musc/Skel:  Normal and Other no pain with UE or LE ROM Gen NAD   Assessment/Plan: 1. 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 Physiatrist is providing close team supervision and 24 hour management of active medical problems listed below. Physiatrist and rehab team continue to assess barriers to discharge/monitor patient progress toward functional and medical goals. FIM: Function - Bathing Position: Shower Body parts bathed by patient: Right arm, Left arm, Abdomen, Chest, Front perineal area, Buttocks, Right upper leg, Left upper leg, Right lower leg, Left lower leg Assist Level: Supervision or verbal cues  Function- Upper  Body Dressing/Undressing What is the patient wearing?: Pull over shirt/dress Pull over shirt/dress - Perfomed by patient: Thread/unthread right sleeve, Put head through opening, Pull shirt over trunk, Thread/unthread left sleeve Assist Level: No help, No cues Function - Lower Body Dressing/Undressing What is the patient wearing?: Pants, Socks, Shoes, Underwear Position: Sitting EOB Underwear - Performed by patient: Thread/unthread right underwear leg, Thread/unthread left underwear leg, Pull underwear up/down Pants- Performed by patient: Thread/unthread right pants leg, Thread/unthread left pants leg, Pull pants up/down, Fasten/unfasten pants Non-skid slipper socks- Performed by patient: Don/doff left sock, Don/doff right sock Socks - Performed by patient: Don/doff left sock, Don/doff right sock Shoes - Performed by patient: Don/doff right shoe, Fasten right, Don/doff left shoe, Fasten left Assist for footwear: Supervision/touching assist Assist for lower body dressing: Supervision or verbal cues  Function - Toileting Toileting steps completed by patient: Adjust clothing prior to toileting, Performs perineal hygiene, Adjust clothing after toileting Toileting Assistive Devices: Grab bar or rail Assist level: Supervision or verbal cues  Function Midwife transfer assistive device: Grab bar Assist level to toilet: Touching or steadying assistance (Pt > 75%) Assist level from toilet: Touching or steadying assistance (Pt > 75%)  Function - Chair/bed transfer Chair/bed transfer method: Stand pivot, Ambulatory Chair/bed transfer assist level: Supervision or verbal cues Chair/bed transfer assistive device: Armrests Chair/bed transfer details: Verbal cues for technique, Verbal cues for precautions/safety  Function - Locomotion: Wheelchair Will patient use wheelchair at discharge?: No Function - Locomotion: Ambulation Assistive device: No device Max distance: 1000' Assist  level: Touching or steadying assistance (Pt > 75%) Assist level: Touching or steadying assistance (Pt > 75%) Assist level: Touching  or steadying assistance (Pt > 75%) Assist level: Touching or steadying assistance (Pt > 75%) Assist level: Moderate assist (Pt 50 - 74%)  Function - Comprehension Comprehension: Auditory Comprehension assist level: Follows complex conversation/direction with no assist  Function - Expression Expression: Verbal Expression assist level: Expresses complex ideas: With no assist  Function - Social Interaction Social Interaction assist level: Interacts appropriately with others - No medications needed.  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  He should be receiving therapy over this weekend as well to meet goals. 2. DVT Prophylaxis/Anticoagulation: Subcutaneous heparin change to Lovenox to improved effectiveness and reduce needle sticks. Monitor for any bleeding episodes 3. Pain Management:Tylenol as needed, no current pain c/os 4. Mood:Provide emotional support 5. Neuropsych: This patientiscapable of making decisions on hisown behalf. 6. Skin/Wound Care:Routine skin checks 7. Fluids/Electrolytes/Nutrition:Routine in and outs with follow-up chemistries wnl except hypoalbuminemia 8.Chronic systolic congestive heart failure. Monitor for any signs of fluid overload 9.Hypertension. Coreg  3.125 mg twice daily- Controlled 5/11-meds per cardiology Vitals:   01/30/18 0643 01/30/18 0827  BP: 121/69 (!) 114/59  Pulse: (!) 36 (!) 38  Resp: 19   Temp: 98.3 F (36.8 C)   SpO2: 96%   10.History of bradycardia and PVC. Patient asymptomatic. 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 Follow-up chemistries normal   12.Hyperlipidemia. Lipitor 13.BPH. Flomax 0.4 mg daily. Check PVR x3 14.Constipation. Laxative assistance    LOS (Days) 4 A FACE TO FACE EVALUATION WAS PERFORMED  Meredith Staggers 01/30/2018, 9:16 AM

## 2018-01-31 ENCOUNTER — Inpatient Hospital Stay (HOSPITAL_COMMUNITY): Payer: PPO | Admitting: Occupational Therapy

## 2018-01-31 NOTE — Progress Notes (Signed)
Several times today bed alarm has been on and when come back in room bed alarm is off.  Pt has not used call bell for assistance. Encouraged to call for assistance.

## 2018-01-31 NOTE — Progress Notes (Signed)
Occupational Therapy Session Note  Patient Details  Name: Patrick Maxwell MRN: [id number] Date of Birth: 11-Nov-1930  Today's Date: 01/31/2018 OT Individual Time: 4471-5806 OT Individual Time Calculation (min): 60 min    Short Term Goals: Week 1:  OT Short Term Goal 1 (Week 1): STG=LTG 2/2 ELOS  Skilled Therapeutic Interventions/Progress Updates:    Pt greeted semi-reclined in bed reading newspaper. Pt frustrated at still being in the hospital and wants to go home. Utilized therapeutic use of self and encouraged pt to make the most of his therapy sessions, pt agreeable. Pt declined bathing, but changed clothes seated EOB with good recall of safe techniques and mod I for dressing tasks. Pt then ambulated in the room without AD with verbal cues not to furniture walk and supervision overall. Dynamic standing balance at the sink to brush teeth, wash face, comb hair without LOB. Pt then ambulated to bathroom and voided bladder, completed 3/3 toileting steps mod I. Pt reports he feels more comfortable walking with RW, so ambulated to day room with RW and distant supervision. Pt completed 15 mins on NuStep on level 5. Pt ambulated back to room with RW and supervision. Left seated EOB with bed alarm on and needs met.  Therapy Documentation Precautions:  Precautions Precautions: Fall Precaution Comments: left sided ataxia and left lateral lean Restrictions Weight Bearing Restrictions: No Pain:  none/denies pain ADL: ADL ADL Comments: Please see functional navigator  See Function Navigator for Current Functional Status.   Therapy/Group: Individual Therapy  Valma Cava 01/31/2018, 9:07 AM

## 2018-01-31 NOTE — Progress Notes (Signed)
Subjective/Complaints:  Patient still complaining about wanting to go home today.  States he did not have any therapy yesterday nor any plan today.  However, when I stated we could give him some scheduled therapy he stated "I do not want to do it anyway."  Therapy documents resistance yesterday with therapy also.  ROS: Limited due to cognitive/behavioral   Objective: Vital Signs: Blood pressure 119/71, pulse 60, temperature 98 F (36.7 C), temperature source Oral, resp. rate 20, height 5\' 10"  (1.778 m), weight 85 kg (187 lb 6.3 oz), SpO2 94 %. No results found. No results found for this or any previous visit (from the past 72 hour(s)).   General: No acute distress HEENT: EOMI, oral membranes moist Cards: reg rate  Chest: normal effort Abdomen: Soft, NT, ND Skin: dry, intact Extremities: no edema Neuro: Alert/Oriented, Normal Sensory, Normal Motor, Abnormal FMC Ataxic/ dec FMC and Other Limb ataxia, dysmetria Left finger nose finger and left heel to shin Musc/Skel:  Normal and Other no pain with UE or LE ROM Gen NAD   Assessment/Plan: 1. 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 Physiatrist is providing close team supervision and 24 hour management of active medical problems listed below. Physiatrist and rehab team continue to assess barriers to discharge/monitor patient progress toward functional and medical goals. FIM: Function - Bathing Position: Shower Body parts bathed by patient: Right arm, Left arm, Abdomen, Chest, Front perineal area, Buttocks, Right upper leg, Left upper leg, Right lower leg, Left lower leg Assist Level: Supervision or verbal cues  Function- Upper Body Dressing/Undressing What is the patient wearing?: Pull over shirt/dress Pull over shirt/dress - Perfomed by patient: Thread/unthread right sleeve, Put head through  opening, Pull shirt over trunk, Thread/unthread left sleeve Assist Level: No help, No cues Function - Lower Body Dressing/Undressing What is the patient wearing?: Pants, Socks, Shoes, Underwear Position: Sitting EOB Underwear - Performed by patient: Thread/unthread right underwear leg, Thread/unthread left underwear leg, Pull underwear up/down Pants- Performed by patient: Thread/unthread right pants leg, Thread/unthread left pants leg, Pull pants up/down, Fasten/unfasten pants Non-skid slipper socks- Performed by patient: Don/doff left sock, Don/doff right sock Socks - Performed by patient: Don/doff left sock, Don/doff right sock Shoes - Performed by patient: Don/doff right shoe, Fasten right, Don/doff left shoe, Fasten left Assist for footwear: Supervision/touching assist Assist for lower body dressing: Supervision or verbal cues  Function - Toileting Toileting steps completed by patient: Adjust clothing prior to toileting, Performs perineal hygiene, Adjust clothing after toileting Toileting Assistive Devices: Grab bar or rail Assist level: Supervision or verbal cues  Function Midwife transfer assistive device: Grab bar Assist level to toilet: Touching or steadying assistance (Pt > 75%) Assist level from toilet: Touching or steadying assistance (Pt > 75%)  Function - Chair/bed transfer Chair/bed transfer method: Stand pivot, Ambulatory Chair/bed transfer assist level: Supervision or verbal cues Chair/bed transfer assistive device: Armrests Chair/bed transfer details: Verbal cues for technique, Verbal cues for precautions/safety  Function - Locomotion: Wheelchair Will patient use wheelchair at discharge?: No Function - Locomotion: Ambulation Assistive device: No device, Walker-rolling Max distance: 160ft  Assist level: Supervision or verbal cues Assist level: Supervision or verbal cues Assist level: Supervision or verbal cues Assist level: Supervision or verbal  cues Assist level: Moderate assist (Pt 50 - 74%)  Function - Comprehension Comprehension: Auditory Comprehension assist level: Follows complex conversation/direction with no assist  Function - Expression Expression: Verbal Expression assist level: Expresses complex ideas: With no assist  Function - Social Interaction Social Interaction assist level: Interacts appropriately with others - No medications needed.  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 DC Plan will stay the same.   2. DVT Prophylaxis/Anticoagulation: Subcutaneous heparin change to Lovenox to improved effectiveness and reduce needle sticks. Monitor for any bleeding episodes 3. Pain Management:Tylenol as needed, no current pain c/os 4. Mood:Provide emotional support 5. Neuropsych: This patientiscapable of making decisions on hisown behalf. 6. Skin/Wound Care:Routine skin checks 7. Fluids/Electrolytes/Nutrition:Routine in and outs with follow-up chemistries wnl except hypoalbuminemia 8.Chronic systolic congestive heart failure. Monitor for any signs of fluid overload 9.Hypertension. Coreg 3.125 mg twice daily- Controlled 5/12-meds per cardiology Vitals:   01/30/18 2013 01/31/18 0534  BP: 127/61 119/71  Pulse: (!) 58 60  Resp: 18 20  Temp: 97.9 F (36.6 C) 98 F (36.7 C)  SpO2: 96% 94%  10.History of bradycardia and PVC. Patient asymptomatic. 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 Follow-up chemistries normal   12.Hyperlipidemia. Lipitor 13.BPH. Flomax 0.4 mg daily. Check PVR x3 14.Constipation. Laxative assistance    LOS (Days) 5 A FACE TO FACE EVALUATION WAS PERFORMED  Meredith Staggers 01/31/2018, 8:38 AM

## 2018-02-01 ENCOUNTER — Inpatient Hospital Stay (HOSPITAL_COMMUNITY): Payer: PPO | Admitting: Occupational Therapy

## 2018-02-01 ENCOUNTER — Inpatient Hospital Stay (HOSPITAL_COMMUNITY): Payer: PPO

## 2018-02-01 NOTE — Discharge Summary (Signed)
NAMEVERA, Patrick Maxwell MEDICAL RECORD [id number] ACCOUNT [id number] DATE OF BIRTH:Nov 17, 1930 FACILITY: [facility] LOCATION: [facility] PHYSICIAN:  DISCHARGE SUMMARY  DATE OF DISCHARGE:  02/01/2018  DISCHARGE DIAGNOSES: 1.  Left posterior inferior cerebellar infarction. 2.  Subcutaneous Lovenox for deep venous thrombosis prophylaxis.   3.  Chronic systolic congestive heart failure. 4.  Hypertension.   5.  History of bradycardia with premature ventricular contractions.   6.  Chronic kidney disease, stage II. 7.  Hyperlipidemia 8.  Benign prostatic hypertrophy.  9.  Constipation.  HISTORY OF PRESENT ILLNESS:  This is an 84 year old right-handed male, history of CVA in 2009, maintained on aspirin, chronic systolic congestive heart failure, CAD, and bradycardia.  Lives with spouse, independent prior to admission.  Presented  01/21/2018 with vertigo and tingling of the left side as well as noted to be ataxic.  MRI showed left cerebellar infarction.  Per report acute left posterior inferior cerebellar infarction with chronic small vessel disease as well as old left basal  ganglia and corona radiata infarctions.  CT angiogram negative for large vessel occlusion, left PICA partial thrombosis.  Echocardiogram with ejection fraction of 35% with diffuse hypokinesis.  Neurology consulted.  Initially maintained on aspirin and  Plavix for CVA prophylaxis.  Repeat echocardiogram 01/25/2018 to rule out apical thrombus negative.  There was noted diffuse hypokinesis.  Subcutaneous heparin for DVT prophylaxis.  TEE showed no thrombus.  Physical and occupational therapy completed.   The patient was admitted for comprehensive rehabilitation program.  PAST MEDICAL HISTORY:  See discharge diagnoses.    SOCIAL HISTORY:  Lives with spouse, independent prior to admission.  Functional status upon admission to rehab services was minimal assist 20 feet, 2 person handheld assist, moderate assist stand pivot transfers,  min/mod assist activities of daily living.  PHYSICAL EXAMINATION: VITAL SIGNS:  Blood pressure 112/62, pulse 40, temperature 97, respirations 14. GENERAL:  Alert male, somewhat impulsive oriented to person and place.  EOMs intact. NECK:  Supple, nontender.  No JVD. HEART:  Rate controlled. ABDOMEN:  Soft, nontender, good bowel sounds. LUNGS:  Clear to auscultation without wheeze.  HOSPITAL COURSE:  The patient was admitted to inpatient rehabilitation services.  Therapy was initiated on a 3-hour daily basis, consisting of physical therapy, occupational therapy and rehabilitation nursing.  The following issues were addressed during  patient's rehabilitation stay:  Pertaining to the patient's left posterior inferior cerebellar artery infarction, remained stable.  He would follow up with neurology services.  He had initially been on subcutaneous Lovenox for DVT prophylaxis with  followup per cardiology services for history of bradycardia, PVC.  Is now maintained on Eliquis as well as aspirin.  He exhibited no other signs of fluid overload.  He remained on Lasix.  Bouts of constipation resolved with laxative assistance.  He did  have a history of BPH.  Continued on Flomax.  The patient was attending therapies, monitoring of heart rate.  He will continue on amiodarone 200 mg twice daily with followup with cardiology services.  The patient remained quite adamant about being  discharged to home.  He was ambulating up to 300 feet, supervision assistance, monitoring for safety.  He could ambulate back to his room, sit to supine with distant contact guard assist, gather belongings for activities of daily living and homemaking.   Family teaching completed and planned discharge to home.  DISCHARGE MEDICATIONS:  Included amiodarone 200 mg p.o. b.i.d., Eliquis 5 mg p.o. b.i.d., aspirin 81 mg p.o. daily, Lipitor 40 mg p.o. daily, Coreg 3.125  mg p.o. b.i.d., Lasix 20 mg p.o. daily, Cozaar 12.5 mg p.o. daily, MiraLax  daily, hold for loose  stool.  Flomax 0.4 mg p.o. daily, Antivert 25 mg p.o. t.i.d. as needed.    DIET:  His diet was regular.    FOLLOWUP:  He would follow up with Dr. Eusebio Me at the outpatient rehab center as directed Dr. Angelena Form - call for appointment.  Dr. Erlinda Hong, neurology services - call for appointment in six weeks.  Dr. Buelah Manis, medical management.  SPECIAL INSTRUCTIONS:  No driving.  AN/NUANCE Y:[id number]/[id number] T:02/01/2018 JOB:[id number]

## 2018-02-01 NOTE — Progress Notes (Signed)
Physical Therapy Discharge Summary  Patient Details  Name: DOSSIE SWOR MRN: [id number] Date of Birth: 1931-08-10  Today's Date: 02/01/2018 PT Individual Time: 7510-2585 PT Individual Time Calculation (min): 54 min    Patient has met 7 of 7 long term goals due to improved activity tolerance, improved balance, ability to compensate for deficits and improved coordination.  Patient to discharge at an ambulatory level Supervision.   Patient's care partner is independent to provide the necessary supervision assistance at discharge. Pt's wife will be at home with pt upon d/c and can provide supervision as needed for high level balance and distance ambulation.   All goals met.   Recommendation:  Patient will benefit from ongoing skilled PT services in outpatient setting to continue to advance safe functional mobility, address ongoing impairments in balance, coordination, functional mobility, and minimize fall risk.  Equipment: RW  Reasons for discharge: treatment goals met  Patient/family agrees with progress made and goals achieved: Yes   PT Treatment Interventions: Pt supine in bed upon PT arrival, agreeable to therapy tx and denies pain. Pt transferred to sitting EOB Mod I. Pt ready to d/c and reports he does not want to go to the gym. Pt agreeable to perform bathing/dressing tasks this session. Pt ambulated throughout room without AD and with supervision to gather clothes/supplies for shower. Pt ambulated into bathroom with supervision, performed toileting Mod I. Pt performed all clothing management Mod I and transferred into shower with supervision. Pt able to maintain dynamic standing balance in order to wash body, dry off and perform upper/lower body dressing following shower. Pt ambulated to sink with supervision. Pt performed standing balance at sink while brushing teeth, brushing hair and cleaning glasses, Mod I. Pt seated EOB donned shoes and socks Mod I. Pt willing to ambulate  throughout unit with RW and supervision. Pt ambulated from room<>ortho gym x 200 ft with RW and supervision. Pt performed car transfer with supervision. Pt ascended/descended 12 steps in stair well using single handrail and reciprocal pattern with supervision. Berg balance test completed as detailed below. Pt ambulated back to room and left seated with bed alarm set.  PT Discharge Precautions/Restrictions Precautions Precautions: Fall Restrictions Weight Bearing Restrictions: No Vital Signs Therapy Vitals Temp: 97.7 F (36.5 C) Temp Source: Oral Pulse Rate: 73 Resp: 18 BP: 121/78 Patient Position (if appropriate): Lying Oxygen Therapy SpO2: 96 % O2 Device: Room Air Cognition Overall Cognitive Status: Within Functional Limits for tasks assessed Arousal/Alertness: Awake/alert Orientation Level: Oriented X4 Memory: Appears intact Awareness: Appears intact Safety/Judgment: Appears intact Sensation Sensation Light Touch: Appears Intact Proprioception: Appears Intact Additional Comments: sensation appears intact B LEs Coordination Gross Motor Movements are Fluid and Coordinated: No Fine Motor Movements are Fluid and Coordinated: No Coordination and Movement Description: mild incoordination/ataxia L LE Finger Nose Finger Test: slight dysmetira Motor  Motor Motor: Ataxia Motor - Skilled Clinical Observations: mild ataxia L UE/L LE  Trunk/Postural Assessment  Cervical Assessment Cervical Assessment: Within Functional Limits Thoracic Assessment Thoracic Assessment: Within Functional Limits Lumbar Assessment Lumbar Assessment: Within Functional Limits Postural Control Postural Control: Within Functional Limits  Balance Balance Balance Assessed: Yes Standardized Balance Assessment Standardized Balance Assessment: Berg Balance Test Berg Balance Test Sit to Stand: Able to stand without using hands and stabilize independently Standing Unsupported: Able to stand safely 2  minutes Sitting with Back Unsupported but Feet Supported on Floor or Stool: Able to sit safely and securely 2 minutes Stand to Sit: Sits safely with minimal use of hands Transfers:  Able to transfer safely, definite need of hands Standing Unsupported with Eyes Closed: Able to stand 10 seconds safely Standing Ubsupported with Feet Together: Able to place feet together independently and stand 1 minute safely From Standing, Reach Forward with Outstretched Arm: Can reach forward >12 cm safely (5") From Standing Position, Pick up Object from Floor: Able to pick up shoe safely and easily From Standing Position, Turn to Look Behind Over each Shoulder: Looks behind from both sides and weight shifts well Turn 360 Degrees: Able to turn 360 degrees safely but slowly Standing Unsupported, Alternately Place Feet on Step/Stool: Able to stand independently and complete 8 steps >20 seconds Standing Unsupported, One Foot in Front: Able to plae foot ahead of the other independently and hold 30 seconds Standing on One Leg: Tries to lift leg/unable to hold 3 seconds but remains standing independently Total Score: 47 Static Sitting Balance Static Sitting - Level of Assistance: 6: Modified independent (Device/Increase time) Dynamic Sitting Balance Dynamic Sitting - Level of Assistance: 6: Modified independent (Device/Increase time) Static Standing Balance Static Standing - Level of Assistance: 6: Modified independent (Device/Increase time) Dynamic Standing Balance Dynamic Standing - Level of Assistance: 5: Stand by assistance Extremity Assessment  RLE Assessment RLE Assessment: Within Functional Limits LLE Assessment LLE Assessment: Within Functional Limits   See Function Navigator for Current Functional Status.  Netta Corrigan, PT, DPT 02/01/2018, 8:50 AM

## 2018-02-01 NOTE — Progress Notes (Signed)
Pt d/c home with wife and belongings. Discharge instructions provided to pt and family by D. Midway PA. Pt and family verbalized an understanding of medications and follow up appts.

## 2018-02-01 NOTE — Discharge Summary (Signed)
Discharge summary job # [id number]

## 2018-02-01 NOTE — Progress Notes (Signed)
Disc d/c with pt.  Informed time will be around 10am or so.  Walker needed. CW aware.

## 2018-02-01 NOTE — Discharge Instructions (Signed)
Inpatient Rehab Discharge Instructions  Patrick Maxwell Discharge date and time: No discharge date for patient encounter.   Activities/Precautions/ Functional Status: Activity: activity as tolerated Diet: regular diet Wound Care: none needed Functional status:  ___ No restrictions     ___ Walk up steps independently ___ 24/7 supervision/assistance   ___ Walk up steps with assistance ___ Intermittent supervision/assistance  ___ Bathe/dress independently ___ Walk with walker     _x__ Bathe/dress with assistance ___ Walk Independently    ___ Shower independently ___ Walk with assistance    ___ Shower with assistance ___ No alcohol     ___ Return to work/school ________  COMMUNITY REFERRALS UPON DISCHARGE:   Home Health:   PT     OT   Agency:  [facility] Phone:  [phone] Medical Equipment/Items Ordered:  Rolling walker (pt will get shower seat at home)  Agency/Supplier:  [facility]         Phone:  [phone]  GENERAL COMMUNITY RESOURCES FOR PATIENT/FAMILY: Support Groups:  [facility] Stroke Support Group                              Meets the second Thursday of each month from 3-4PM (except June, July, and August)                              In the dayroom of [place] at [facility]                              For more information, call [phone]  Special Instructions: No driving STROKE/TIA DISCHARGE INSTRUCTIONS SMOKING Cigarette smoking nearly doubles your risk of having a stroke & is the single most alterable risk factor  If you smoke or have smoked in the last 12 months, you are advised to quit smoking for your health.  Most of the excess cardiovascular risk related to smoking disappears within a year of stopping.  Ask you doctor about anti-smoking medications  [place] Quit Line: 1-800-QUIT NOW  Free Smoking Cessation Classes [phone]  CHOLESTEROL Know your levels; limit fat &  cholesterol in your diet  Lipid Panel     Component Value Date/Time   CHOL 151 01/22/2018 0720   TRIG 64 01/22/2018 0720   HDL 41 01/22/2018 0720   CHOLHDL 3.7 01/22/2018 0720   VLDL 13 01/22/2018 0720   LDLCALC 97 01/22/2018 0720   LDLCALC 114 (H) 11/09/2017 0841      Many patients benefit from treatment even if their cholesterol is at goal.  Goal: Total Cholesterol (CHOL) less than 160  Goal:  Triglycerides (TRIG) less than 150  Goal:  HDL greater than 40  Goal:  LDL (LDLCALC) less than 100   BLOOD PRESSURE American Stroke Association blood pressure target is less that 120/80 mm/Hg  Your discharge blood pressure is:  BP: 139/83  Monitor your blood pressure  Limit your salt and alcohol intake  Many individuals will require more than one medication for high blood pressure  DIABETES (A1c is a blood sugar average for last 3 months) Goal HGBA1c is under 7% (HBGA1c is blood sugar average for last 3 months)  Diabetes: No known diagnosis of diabetes    Lab Results  Component Value Date   HGBA1C 5.7 (H) 01/22/2018  Your HGBA1c can be lowered with medications, healthy diet, and exercise.  Check your blood sugar as directed by your physician  Call your physician if you experience unexplained or low blood sugars.  PHYSICAL ACTIVITY/REHABILITATION Goal is 30 minutes at least 4 days per week  Activity: Increase activity slowly, Therapies: Physical Therapy: Home Health Return to work:   Activity decreases your risk of heart attack and stroke and makes your heart stronger.  It helps control your weight and blood pressure; helps you relax and can improve your mood.  Participate in a regular exercise program.  Talk with your doctor about the best form of exercise for you (dancing, walking, swimming, cycling).  DIET/WEIGHT Goal is to maintain a healthy weight  Your discharge diet is:  Diet Order           Diet regular Room service appropriate? Yes; Fluid consistency: Thin   Diet effective now          liquids Your height is:  Height: 5\' 10"  (177.8 cm)(per patient report) Your current weight is: Weight: 85.8 kg (189 lb 2.5 oz) Your Body Mass Index (BMI) is:  BMI (Calculated): 27.14  Following the type of diet specifically designed for you will help prevent another stroke.  Your goal weight range is:    Your goal Body Mass Index (BMI) is 19-24.  Healthy food habits can help reduce 3 risk factors for stroke:  High cholesterol, hypertension, and excess weight.  RESOURCES Stroke/Support Group:  Call [phone]   STROKE EDUCATION PROVIDED/REVIEWED AND GIVEN TO PATIENT Stroke warning signs and symptoms How to activate emergency medical system (call 911). Medications prescribed at discharge. Need for follow-up after discharge. Personal risk factors for stroke. Pneumonia vaccine given:  Flu vaccine given:  My questions have been answered, the writing is legible, and I understand these instructions.  I will adhere to these goals & educational materials that have been provided to me after my discharge from the hospital.      My questions have been answered and I understand these instructions. I will adhere to these goals and the provided educational materials after my discharge from the hospital.  Patient/Caregiver Signature _______________________________ Date __________  Clinician Signature _______________________________________ Date __________  Please bring this form and your medication list with you to all your follow-up doctor's appointments.

## 2018-02-01 NOTE — Progress Notes (Signed)
Occupational Therapy Discharge Summary  Patient Details  Name: AHNAF CAPONI MRN: [id number] Date of Birth: [birth year]  Patient has met 8 of 8 long term goals due to improved activity tolerance, improved balance, postural control, ability to compensate for deficits, functional use of  LEFT upper and LEFT lower extremity, improved attention and improved coordination.  Patient to discharge at overall Modified Independent/supervision level.  Patient's care partner is independent to provide the necessary physical assistance at discharge for higher level iADL tasks.    Reasons goals not met: n/a  Recommendation:  Patient will benefit from ongoing skilled OT services in home health setting to continue to advance functional skills in the area of BADL.  Equipment: rolling walker  Reasons for discharge: treatment goals met and discharge from hospital  Patient/family agrees with progress made and goals achieved: Yes  OT Discharge Precautions/Restrictions  Precautions Precautions: Fall Restrictions Weight Bearing Restrictions: No Pain  none/denies pain ADL ADL ADL Comments: Please see functional navigator Perception  Perception: Within Functional Limits Cognition Overall Cognitive Status: Within Functional Limits for tasks assessed Arousal/Alertness: Awake/alert Orientation Level: Oriented X4 Memory: Appears intact Awareness: Appears intact Safety/Judgment: Appears intact Sensation Sensation Light Touch: Appears Intact Proprioception: Appears Intact Additional Comments: sensation appears intact B LEs Coordination Gross Motor Movements are Fluid and Coordinated: No Fine Motor Movements are Fluid and Coordinated: No Coordination and Movement Description: mild incoordination/ataxia L UE/LE Finger Nose Finger Test: Improved dysmetria Motor  Motor Motor: Ataxia Motor - Discharge Observations: mild ataxia L UE/L LE Trunk/Postural Assessment  Cervical Assessment Cervical  Assessment: Within Functional Limits Thoracic Assessment Thoracic Assessment: Within Functional Limits Lumbar Assessment Lumbar Assessment: Within Functional Limits Postural Control Postural Control: Within Functional Limits  Balance Static Sitting Balance Static Sitting - Level of Assistance: 6: Modified independent (Device/Increase time) Dynamic Sitting Balance Dynamic Sitting - Level of Assistance: 6: Modified independent (Device/Increase time) Static Standing Balance Static Standing - Level of Assistance: 6: Modified independent (Device/Increase time) Dynamic Standing Balance Dynamic Standing - Level of Assistance: 5: Stand by assistance Extremity/Trunk Assessment RUE Assessment RUE Assessment: Within Functional Limits LUE Assessment LUE Assessment: Within Functional Limits   See Function Navigator for Current Functional Status.  Daneen Schick Dafne Nield 02/01/2018, 1:00 PM

## 2018-02-01 NOTE — Progress Notes (Addendum)
Social Work Discharge Note  The overall goal for the admission was met for:   Discharge location: Yes - home  Length of Stay: Yes - 6 days  Discharge activity level: Yes - supervision to mod I  Home/community participation: Yes  Services provided included: MD, RD, PT, OT, RN, Pharmacy and SW  Financial Services: Private Insurance: [employer]  Follow-up services arranged: Home Health: PT/OT from [facility], DME: rolling walker from [facility] and Patient/Family has no preference for HH/DME agencies  OT recommended shower seat and pt will obtain this on his own.  Comments (or additional information):Wife has been present for family education with PT/OT.  She felt prepared to take pt home once he was supervision level, as she cannot provide any hands on care.  Pt ready to go home and is accepting of using walker in order to work toward independence at home.  Patient/Family verbalized understanding of follow-up arrangements: Yes  Individual responsible for coordination of the follow-up plan: pt and his wife  Confirmed correct DME delivered: Trey Sailors 02/01/2018    Marcia Lepera, Silvestre Mesi

## 2018-02-01 NOTE — Progress Notes (Signed)
Subjective/Complaints:  No new issues overnite  ROS: Limited due to cognitive/behavioral   Objective: Vital Signs: Blood pressure 121/78, pulse 73, temperature 97.7 F (36.5 C), temperature source Oral, resp. rate 18, height 5\' 10"  (1.778 m), weight 84.8 kg (186 lb 15.2 oz), SpO2 96 %. No results found. No results found for this or any previous visit (from the past 72 hour(s)).   General: No acute distress HEENT: EOMI, oral membranes moist Cards: reg rate  Chest: normal effort Abdomen: Soft, NT, ND Skin: dry, intact Extremities: no edema Neuro: Alert/Oriented, Normal Sensory, Normal Motor, Abnormal FMC Ataxic/ dec FMC and Other Limb ataxia, dysmetria Left finger nose finger and left heel to shin Musc/Skel:  Normal and Other no pain with UE or LE ROM Gen NAD   Assessment/Plan: 1. 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 Stable for D/C today F/u PCP in 3-4 weeks F/u PM&R 2 weeks See D/C summary See D/C instructions FIM: Function - Bathing Position: Shower Body parts bathed by patient: Right arm, Left arm, Abdomen, Chest, Front perineal area, Buttocks, Right upper leg, Left upper leg, Right lower leg, Left lower leg Assist Level: Supervision or verbal cues  Function- Upper Body Dressing/Undressing What is the patient wearing?: Pull over shirt/dress Pull over shirt/dress - Perfomed by patient: Thread/unthread right sleeve, Put head through opening, Pull shirt over trunk, Thread/unthread left sleeve Assist Level: No help, No cues Function - Lower Body Dressing/Undressing What is the patient wearing?: Pants, Socks, Shoes, Underwear Position: Sitting EOB Underwear - Performed by patient: Thread/unthread right underwear leg, Thread/unthread left underwear leg, Pull underwear up/down Pants- Performed by patient: Thread/unthread right pants leg, Thread/unthread left pants leg, Pull pants up/down, Fasten/unfasten pants Non-skid slipper  socks- Performed by patient: Don/doff left sock, Don/doff right sock Socks - Performed by patient: Don/doff left sock, Don/doff right sock Shoes - Performed by patient: Don/doff right shoe, Fasten right, Don/doff left shoe, Fasten left Assist for footwear: Supervision/touching assist Assist for lower body dressing: Supervision or verbal cues  Function - Toileting Toileting steps completed by patient: Adjust clothing prior to toileting, Performs perineal hygiene, Adjust clothing after toileting Toileting Assistive Devices: Grab bar or rail Assist level: Supervision or verbal cues  Function Midwife transfer assistive device: Grab bar Assist level to toilet: Touching or steadying assistance (Pt > 75%) Assist level from toilet: Touching or steadying assistance (Pt > 75%)  Function - Chair/bed transfer Chair/bed transfer method: Stand pivot, Ambulatory Chair/bed transfer assist level: Supervision or verbal cues Chair/bed transfer assistive device: Armrests Chair/bed transfer details: Verbal cues for technique, Verbal cues for precautions/safety  Function - Locomotion: Wheelchair Will patient use wheelchair at discharge?: No Function - Locomotion: Ambulation Assistive device: No device, Walker-rolling Max distance: 118ft  Assist level: Supervision or verbal cues Assist level: Supervision or verbal cues Assist level: Supervision or verbal cues Assist level: Supervision or verbal cues Assist level: Moderate assist (Pt 50 - 74%)  Function - Comprehension Comprehension: Auditory Comprehension assist level: Follows complex conversation/direction with no assist  Function - Expression Expression: Verbal Expression assist level: Expresses complex ideas: With no assist  Function - Social Interaction Social Interaction assist level: Interacts appropriately with others - No medications needed.  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 DVT Prophylaxis/Anticoagulation: Subcutaneous heparin change to Lovenox to improved effectiveness and reduce needle sticks. Monitor for any bleeding episodes 3. Pain Management:Tylenol as needed, no current pain c/os 4. Mood:Provide emotional support 5. Neuropsych: This patientiscapable of making decisions on hisown behalf. 6. Skin/Wound Care:Routine skin checks 7. Fluids/Electrolytes/Nutrition:Routine in and outs with follow-up chemistries wnl except hypoalbuminemia 8.Chronic systolic congestive heart failure. Monitor for any signs of fluid overload 9.Hypertension. Coreg 3.125 mg twice daily- Controlled 5/13-meds per cardiology Vitals:   01/31/18 2013 02/01/18 0535  BP: 123/69 121/78  Pulse: 73 73  Resp: 18 18  Temp: 97.8 F (36.6 C) 97.7 F (36.5 C)  SpO2: 97% 96%  10.History of bradycardia and PVC. Patient asymptomatic. 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 Follow-up chemistries normal   12.Hyperlipidemia. Lipitor 13.BPH. Flomax 0.4 mg daily. Check PVR x3 14.Constipation. Laxative assistance    LOS (Days) 6 A FACE TO FACE EVALUATION WAS PERFORMED  Patrick Maxwell 02/01/2018, 7:40 AM

## 2018-02-02 ENCOUNTER — Telehealth: Payer: Self-pay | Admitting: *Deleted

## 2018-02-02 DIAGNOSIS — N400 Enlarged prostate without lower urinary tract symptoms: Secondary | ICD-10-CM | POA: Diagnosis not present

## 2018-02-02 DIAGNOSIS — I130 Hypertensive heart and chronic kidney disease with heart failure and stage 1 through stage 4 chronic kidney disease, or unspecified chronic kidney disease: Secondary | ICD-10-CM | POA: Diagnosis not present

## 2018-02-02 DIAGNOSIS — Z7982 Long term (current) use of aspirin: Secondary | ICD-10-CM | POA: Diagnosis not present

## 2018-02-02 DIAGNOSIS — I2510 Atherosclerotic heart disease of native coronary artery without angina pectoris: Secondary | ICD-10-CM | POA: Diagnosis not present

## 2018-02-02 DIAGNOSIS — E7800 Pure hypercholesterolemia, unspecified: Secondary | ICD-10-CM | POA: Diagnosis not present

## 2018-02-02 DIAGNOSIS — Z7901 Long term (current) use of anticoagulants: Secondary | ICD-10-CM | POA: Diagnosis not present

## 2018-02-02 DIAGNOSIS — I5040 Unspecified combined systolic (congestive) and diastolic (congestive) heart failure: Secondary | ICD-10-CM | POA: Diagnosis not present

## 2018-02-02 DIAGNOSIS — E785 Hyperlipidemia, unspecified: Secondary | ICD-10-CM | POA: Diagnosis not present

## 2018-02-02 DIAGNOSIS — I69393 Ataxia following cerebral infarction: Secondary | ICD-10-CM | POA: Diagnosis not present

## 2018-02-02 DIAGNOSIS — N182 Chronic kidney disease, stage 2 (mild): Secondary | ICD-10-CM | POA: Diagnosis not present

## 2018-02-02 DIAGNOSIS — K5900 Constipation, unspecified: Secondary | ICD-10-CM | POA: Diagnosis not present

## 2018-02-02 DIAGNOSIS — I69354 Hemiplegia and hemiparesis following cerebral infarction affecting left non-dominant side: Secondary | ICD-10-CM | POA: Diagnosis not present

## 2018-02-02 DIAGNOSIS — Z9181 History of falling: Secondary | ICD-10-CM | POA: Diagnosis not present

## 2018-02-02 DIAGNOSIS — I493 Ventricular premature depolarization: Secondary | ICD-10-CM | POA: Diagnosis not present

## 2018-02-02 DIAGNOSIS — E039 Hypothyroidism, unspecified: Secondary | ICD-10-CM | POA: Diagnosis not present

## 2018-02-02 NOTE — Telephone Encounter (Signed)
Agree with above 

## 2018-02-02 NOTE — Telephone Encounter (Signed)
Received call from [place], [facility] PT with [place]. [phone] telephone.   Reports that patient has returned home after CVA with [place].   Requested to have PT 3x weekly x1 week, then 2x weekly x2 weeks for general strengthening, gait and balance.   VO given.

## 2018-02-03 ENCOUNTER — Telehealth: Payer: Self-pay | Admitting: Registered Nurse

## 2018-02-03 NOTE — Telephone Encounter (Signed)
Transitional Care call Transitional Care Call Completed, Appointment Confirmed, Address Confirmed, New Patient Packet Mailed [place] Call Questions Answered by Mr. Schalk  Patient name: Patrick Maxwell  DOB: [birth year] 1. Are you/is patient experiencing any problems since coming home? No a. Are there any questions regarding any aspect of care? No 2. Are there any questions regarding medications administration/dosing? No a. Are meds being taken as prescribed? Yes b. "Patient should review meds with caller to confirm"  3. Have there been any falls? No 4. Has Home Health been to the house and/or have they contacted you? Yes, [facility] a. If not, have you tried to contact them? NA b. Can we help you contact them? NA 5. Are bowels and bladder emptying properly? Yes a. Are there any unexpected incontinence issues? No b. If applicable, is patient following bowel/bladder programs? NA 6. Any fevers, problems with breathing, unexpected pain? No 7. Are there any skin problems or new areas of breakdown? No 8. Has the patient/family member arranged specialty MD follow up (ie cardiology/neurology/renal/surgical/etc.)?  [name] states she has to make a few more calls in the morning. To finish scheduling follow up appointments she states. a. Can we help arrange? NA 9. Does the patient need any other services or support that we can help arrange? NO 10. Are caregivers following through as expected in assisting the patient? Yes 11. Has the patient quit smoking, drinking alcohol, or using drugs as recommended? [name] states [name] desn't smoke, drink alcohol or use illicit drugs.  Appointment date/time 02/09/2018, arrival time 10:20 for 10:40 appointmen, with Danella Sensing ANP. [place]

## 2018-02-05 DIAGNOSIS — Z7901 Long term (current) use of anticoagulants: Secondary | ICD-10-CM | POA: Diagnosis not present

## 2018-02-05 DIAGNOSIS — E7800 Pure hypercholesterolemia, unspecified: Secondary | ICD-10-CM | POA: Diagnosis not present

## 2018-02-05 DIAGNOSIS — K5900 Constipation, unspecified: Secondary | ICD-10-CM | POA: Diagnosis not present

## 2018-02-05 DIAGNOSIS — I69393 Ataxia following cerebral infarction: Secondary | ICD-10-CM | POA: Diagnosis not present

## 2018-02-05 DIAGNOSIS — E039 Hypothyroidism, unspecified: Secondary | ICD-10-CM | POA: Diagnosis not present

## 2018-02-05 DIAGNOSIS — I130 Hypertensive heart and chronic kidney disease with heart failure and stage 1 through stage 4 chronic kidney disease, or unspecified chronic kidney disease: Secondary | ICD-10-CM | POA: Diagnosis not present

## 2018-02-05 DIAGNOSIS — I2510 Atherosclerotic heart disease of native coronary artery without angina pectoris: Secondary | ICD-10-CM | POA: Diagnosis not present

## 2018-02-05 DIAGNOSIS — N400 Enlarged prostate without lower urinary tract symptoms: Secondary | ICD-10-CM | POA: Diagnosis not present

## 2018-02-05 DIAGNOSIS — Z9181 History of falling: Secondary | ICD-10-CM | POA: Diagnosis not present

## 2018-02-05 DIAGNOSIS — Z7982 Long term (current) use of aspirin: Secondary | ICD-10-CM | POA: Diagnosis not present

## 2018-02-05 DIAGNOSIS — E785 Hyperlipidemia, unspecified: Secondary | ICD-10-CM | POA: Diagnosis not present

## 2018-02-05 DIAGNOSIS — I5040 Unspecified combined systolic (congestive) and diastolic (congestive) heart failure: Secondary | ICD-10-CM | POA: Diagnosis not present

## 2018-02-05 DIAGNOSIS — N182 Chronic kidney disease, stage 2 (mild): Secondary | ICD-10-CM | POA: Diagnosis not present

## 2018-02-05 DIAGNOSIS — I69354 Hemiplegia and hemiparesis following cerebral infarction affecting left non-dominant side: Secondary | ICD-10-CM | POA: Diagnosis not present

## 2018-02-05 DIAGNOSIS — I493 Ventricular premature depolarization: Secondary | ICD-10-CM | POA: Diagnosis not present

## 2018-02-09 ENCOUNTER — Encounter: Payer: Self-pay | Admitting: Registered Nurse

## 2018-02-09 ENCOUNTER — Ambulatory Visit: Payer: PPO | Admitting: Cardiology

## 2018-02-09 ENCOUNTER — Encounter: Payer: PPO | Attending: Registered Nurse | Admitting: Registered Nurse

## 2018-02-09 VITALS — BP 134/89 | HR 76 | Resp 14 | Ht 68.0 in | Wt 188.0 lb

## 2018-02-09 DIAGNOSIS — E785 Hyperlipidemia, unspecified: Secondary | ICD-10-CM | POA: Insufficient documentation

## 2018-02-09 DIAGNOSIS — I110 Hypertensive heart disease with heart failure: Secondary | ICD-10-CM | POA: Insufficient documentation

## 2018-02-09 DIAGNOSIS — I2510 Atherosclerotic heart disease of native coronary artery without angina pectoris: Secondary | ICD-10-CM | POA: Diagnosis not present

## 2018-02-09 DIAGNOSIS — Z09 Encounter for follow-up examination after completed treatment for conditions other than malignant neoplasm: Secondary | ICD-10-CM | POA: Insufficient documentation

## 2018-02-09 DIAGNOSIS — Z955 Presence of coronary angioplasty implant and graft: Secondary | ICD-10-CM | POA: Diagnosis not present

## 2018-02-09 DIAGNOSIS — Z8673 Personal history of transient ischemic attack (TIA), and cerebral infarction without residual deficits: Secondary | ICD-10-CM | POA: Insufficient documentation

## 2018-02-09 DIAGNOSIS — I10 Essential (primary) hypertension: Secondary | ICD-10-CM | POA: Diagnosis not present

## 2018-02-09 DIAGNOSIS — I63549 Cerebral infarction due to unspecified occlusion or stenosis of unspecified cerebellar artery: Secondary | ICD-10-CM

## 2018-02-09 DIAGNOSIS — F329 Major depressive disorder, single episode, unspecified: Secondary | ICD-10-CM | POA: Diagnosis not present

## 2018-02-09 DIAGNOSIS — I509 Heart failure, unspecified: Secondary | ICD-10-CM | POA: Diagnosis not present

## 2018-02-09 DIAGNOSIS — I5022 Chronic systolic (congestive) heart failure: Secondary | ICD-10-CM | POA: Diagnosis not present

## 2018-02-09 NOTE — Progress Notes (Signed)
Subjective:    Patient ID: Patrick Maxwell, male    DOB: 27-Jun-1931, 84 y.o.   MRN: [id number]  HPI: [name] is a 84 year old male who is here for transitional care visit in follow up of his cerebral infarction, hypertension and congested failure. He has been home with home heath therapies with [facility].  He denies any pain. Also reports his appetite is fair.   Pain Inventory Average Pain 0 Pain Right Now 0 My pain is no pain'  In the last 24 hours, has pain interfered with the following? General activity 0 Relation with others 0 Enjoyment of life 0 What TIME of day is your pain at its worst? no pain Sleep (in general) Good  Pain is worse with: no pain Pain improves with: no pain Relief from Meds: no pain  Mobility walk with assistance use a cane use a walker ability to climb steps?  yes do you drive?  no  Function retired  Neuro/Psych weakness tingling depression  Prior Studies transitional care  Physicians involved in your care transitional care   Family History  Problem Relation Age of Onset  . Stroke Mother   . Early death Mother   . Cancer Brother    Social History   Socioeconomic History  . Marital status: Married    Spouse name: Not on file  . Number of children: Not on file  . Years of education: Not on file  . Highest education level: Not on file  Occupational History  . Not on file  Social Needs  . Financial resource strain: Not on file  . Food insecurity:    Worry: Not on file    Inability: Not on file  . Transportation needs:    Medical: Not on file    Non-medical: Not on file  Tobacco Use  . Smoking status: Never Smoker  . Smokeless tobacco: Never Used  Substance and Sexual Activity  . Alcohol use: No  . Drug use: No  . Sexual activity: Not Currently  Lifestyle  . Physical activity:    Days per week: Not on file    Minutes per session: Not on file  . Stress: Not on file  Relationships  . Social  connections:    Talks on phone: Not on file    Gets together: Not on file    Attends religious service: Not on file    Active member of club or organization: Not on file    Attends meetings of clubs or organizations: Not on file    Relationship status: Not on file  Other Topics Concern  . Not on file  Social History Narrative  . Not on file   Past Surgical History:  Procedure Laterality Date  . CARDIAC CATHETERIZATION N/A 01/23/2016   Procedure: Right/Left Heart Cath and Coronary Angiography;  Surgeon: Burnell Blanks, MD;  Location: [facility];  Service: Cardiovascular;  Laterality: N/A;  . CARDIAC CATHETERIZATION N/A 01/23/2016   Procedure: Coronary Stent Intervention;  Surgeon: Burnell Blanks, MD;  Location: [place] [facility];  Service: Cardiovascular;  Laterality: N/A;  . HERNIA REPAIR  06/1992  . TEE WITHOUT CARDIOVERSION N/A 01/26/2018   Procedure: TRANSESOPHAGEAL ECHOCARDIOGRAM (TEE);  Surgeon: Jolaine Artist, MD;  Location: [facility] ENDOSCOPY;  Service: Cardiovascular;  Laterality: N/A;   Past Medical History:  Diagnosis Date  . CAD (coronary artery disease)   . CHF (congestive heart failure) ([place])   . Frequent unifocal PVCs  Palpitations since early 20s, PVCs and pairs  . Hyperlipidemia   . Hypertension   . Stroke ([place]) 2009   BP 134/89 (BP Location: Right Arm, Patient Position: Sitting, Cuff Size: Normal)   Pulse 76   Resp 14   Ht 5\' 8"  (1.727 m)   Wt 188 lb (85.3 kg)   SpO2 97%   BMI 28.59 kg/m   Opioid Risk Score:   Fall Risk Score:  `1  Depression screen PHQ 2/9  Depression screen [facility] 2/9 02/09/2018 12/11/2017 12/07/2017 11/09/2017 08/17/2017 01/06/2017 12/19/2015  Decreased Interest 1 0 0 0 0 0 0  Down, Depressed, Hopeless 1 0 0 0 0 0 0  PHQ - 2 Score 2 0 0 0 0 0 0  Altered sleeping 0 - - - - 0 -  Tired, decreased energy 0 - - - - 0 -  Change in appetite 1 - - - - 0 -  Feeling bad or failure about yourself  0 - - - - 0 -  Trouble  concentrating 0 - - - - 0 -  Moving slowly or fidgety/restless 0 - - - - 0 -  Suicidal thoughts 0 - - - - 0 -  PHQ-9 Score 3 - - - - 0 -  Difficult doing work/chores - - - - - Not difficult at all -    Review of Systems  Constitutional: Negative.   HENT: Negative.   Eyes: Negative.   Respiratory: Negative.   Cardiovascular: Negative.   Gastrointestinal: Negative.   Endocrine: Negative.   Genitourinary: Negative.   Musculoskeletal: Positive for gait problem.  Allergic/Immunologic: Negative.   Neurological: Positive for weakness.       Tingling   Psychiatric/Behavioral: Positive for dysphoric mood.  All other systems reviewed and are negative.      Objective:   Physical Exam  Constitutional: He is oriented to person, place, and time. He appears well-developed and well-nourished.  HENT:  Head: Normocephalic and atraumatic.  Neck: Normal range of motion. Neck supple.  Cardiovascular: Normal rate and regular rhythm.  Pulmonary/Chest: Effort normal and breath sounds normal.  Musculoskeletal:  Normal Muscle Bulk and Muscle Testing Reveals: Upper Extremities: Full ROM and Muscle Strength 5/5 Lower Extremities: Full ROM and Muscle Strength 5/5  Arises from Table with ease using cane for support Gait is steady with normal steps   Neurological: He is alert and oriented to person, place, and time.  Skin: Skin is warm and dry.  Psychiatric: He has a normal mood and affect.  Nursing note and vitals reviewed.         Assessment & Plan:  1. Cerebral Infarction involving cerebellar artery: Continue with Home Heath Therapies: Follow up with Neurology.  2. Hypertension: Continue current medication regime: PCP Following 3. CHF: Continue current medication regimen: Cardiology Following.   20 minutes of face to face patient care time was spent during this visit. All questions was encouraged and answered.   F/U with DR. Letta Pate in 4-6 weeks

## 2018-02-10 ENCOUNTER — Telehealth: Payer: Self-pay | Admitting: Cardiovascular Disease

## 2018-02-10 NOTE — Telephone Encounter (Signed)
New Message:     Pt is needing to speak with someone in reference to the holter monitor. Pt has canceled appt for tomorrow

## 2018-02-12 ENCOUNTER — Ambulatory Visit (INDEPENDENT_AMBULATORY_CARE_PROVIDER_SITE_OTHER): Payer: PPO | Admitting: Family Medicine

## 2018-02-12 ENCOUNTER — Other Ambulatory Visit: Payer: Self-pay

## 2018-02-12 ENCOUNTER — Encounter: Payer: Self-pay | Admitting: Family Medicine

## 2018-02-12 VITALS — BP 142/68 | HR 70 | Temp 98.1°F | Resp 14 | Ht 68.0 in | Wt 190.0 lb

## 2018-02-12 DIAGNOSIS — I63549 Cerebral infarction due to unspecified occlusion or stenosis of unspecified cerebellar artery: Secondary | ICD-10-CM | POA: Diagnosis not present

## 2018-02-12 DIAGNOSIS — I493 Ventricular premature depolarization: Secondary | ICD-10-CM

## 2018-02-12 DIAGNOSIS — I10 Essential (primary) hypertension: Secondary | ICD-10-CM

## 2018-02-12 NOTE — Telephone Encounter (Signed)
Left message to call back  

## 2018-02-12 NOTE — Telephone Encounter (Signed)
I spoke with pt's wife. She is returning call regarding appointment with Dr. Curt Bears.  Pt did not want to wear holter monitor.  Wife reports pt has had many appointments recently and that is why they cancelled previous appointment with Dr. Curt Bears.  They would like to reschedule. I scheduled pt to see Dr. Curt Bears on May 28,2019 at 11:30

## 2018-02-12 NOTE — Patient Instructions (Addendum)
Take amiodarone  1/2 tablet daily  Cardiology next week F/U August as scheduled

## 2018-02-12 NOTE — Telephone Encounter (Signed)
Pt's wife calling    Stating she is returning call to nurse. Please call pt's wife

## 2018-02-12 NOTE — Progress Notes (Signed)
   Subjective:    Patient ID: Patrick Maxwell, male    DOB: Feb 06, 1931, 84 y.o.   MRN: [id number]  Patient presents for Hospital F/U (CVA) Pt here for hospital follow-up Reviewed hospital discharge and CIR discharge Suffered CVA, requiring inpatient rehab Now walking with cane,previously walker Family is supportive He states he feels well, doesn't feel he needs the cane He actually stopped some of his meds including amiodarone, lipitor,lasix some days, asking to cut back or stop eliquis States amiodarone takes his appetite and makes him feel funny  Note has not discussed any of this with his specialist     Review Of Systems:  GEN- denies fatigue, fever, weight loss,weakness, recent illness HEENT- denies eye drainage, change in vision, nasal discharge, CVS- denies chest pain, palpitations RESP- denies SOB, cough, wheeze ABD- denies N/V, change in stools, abd pain GU- denies dysuria, hematuria, dribbling, incontinence MSK- denies joint pain, muscle aches, injury Neuro- denies headache, dizziness, syncope, seizure activity       Objective:    BP (!) 142/68   Pulse 70   Temp 98.1 F (36.7 C) (Oral)   Resp 14   Ht 5\' 8"  (1.727 m)   Wt 190 lb (86.2 kg)   SpO2 98%   BMI 28.89 kg/m  GEN- NAD, alert and oriented x3,walking with cane  HEENT- PERRL, EOMI, non injected sclera, pink conjunctiva, MMM, oropharynx clear Neck- Supple, no thyromegaly, no bruit CVS- RRR, no murmur RESP-CTAB ABD-NABS,soft,NT,ND EXT- No edema Neuro-CNII-XII in tact, decreased strength in lower ext, unsteady gait  Pulses- Radial, DP- 2+        Assessment & Plan:      Problem List Items Addressed This Visit      Unprioritized   PVC's (premature ventricular contractions)   Hypertension, benign - Primary    Recommend he take the medications as prescribed Deceased amiodarone to 1/2 tablet daily, to at least keep him on the medication until see by cardiology on tuesday      Cerebral  infarction involving cerebellar artery ([place])    Continue with home PT encourged use of his cane Discussed importance of his meds, though he and daughter today, wife per our conversation after his visits, seem skeptical about medications. He is known to stop his meds in the past as well. Will see how cardiology visit goes. Discussed his increase risk of stroke in these next 3-6 months especially off his meds       Other Visit Diagnoses    Hypocalcemia       Relevant Orders   Basic metabolic panel (Completed)      Note: This dictation was prepared with Dragon dictation along with smaller phrase technology. Any transcriptional errors that result from this process are unintentional.

## 2018-02-13 LAB — BASIC METABOLIC PANEL
BUN: 18 mg/dL (ref 7–25)
CHLORIDE: 104 mmol/L (ref 98–110)
CO2: 23 mmol/L (ref 20–32)
CREATININE: 1.1 mg/dL (ref 0.70–1.11)
Calcium: 9.2 mg/dL (ref 8.6–10.3)
Glucose, Bld: 98 mg/dL (ref 65–99)
Potassium: 4.7 mmol/L (ref 3.5–5.3)
Sodium: 139 mmol/L (ref 135–146)

## 2018-02-13 LAB — EXTRA LAV TOP TUBE

## 2018-02-15 ENCOUNTER — Encounter: Payer: Self-pay | Admitting: Family Medicine

## 2018-02-15 NOTE — Assessment & Plan Note (Signed)
Continue with home PT encourged use of his cane Discussed importance of his meds, though he and daughter today, wife per our conversation after his visits, seem skeptical about medications. He is known to stop his meds in the past as well. Will see how cardiology visit goes. Discussed his increase risk of stroke in these next 3-6 months especially off his meds

## 2018-02-15 NOTE — Assessment & Plan Note (Signed)
Recommend he take the medications as prescribed Deceased amiodarone to 1/2 tablet daily, to at least keep him on the medication until see by cardiology on tuesday

## 2018-02-16 ENCOUNTER — Ambulatory Visit (INDEPENDENT_AMBULATORY_CARE_PROVIDER_SITE_OTHER): Payer: PPO | Admitting: Cardiology

## 2018-02-16 ENCOUNTER — Encounter: Payer: Self-pay | Admitting: Cardiology

## 2018-02-16 VITALS — BP 130/88 | HR 75 | Ht 69.0 in | Wt 189.4 lb

## 2018-02-16 DIAGNOSIS — I493 Ventricular premature depolarization: Secondary | ICD-10-CM | POA: Diagnosis not present

## 2018-02-16 DIAGNOSIS — I255 Ischemic cardiomyopathy: Secondary | ICD-10-CM | POA: Diagnosis not present

## 2018-02-16 DIAGNOSIS — I639 Cerebral infarction, unspecified: Secondary | ICD-10-CM

## 2018-02-16 NOTE — Progress Notes (Signed)
Electrophysiology Office Note   Date:  02/16/2018   ID:  Paul, Trettin 02/01/1931, MRN [id number]  PCP:  [name], MD  Cardiologist:  Angelena Form Primary Electrophysiologist:  Will Meredith Leeds, MD    Chief Complaint  Patient presents with  . Follow-up    C. stroke/PVC's     History of Present Illness: Patrick Maxwell is a 84 y.o. male who is being seen today for the evaluation of CVA at the request of Dr. Erlinda Hong. Presenting today for electrophysiology evaluation.  He has a history of coronary artery disease, frequent PVCs, hypertension, hyperlipidemia, CVA, and systolic heart failure he has been to the hospital on 01/20/2018 with acute onset of ataxia and vertigo.  He was found to have a CVA.  He was also noted to have frequent PVCs.  Echocardiogram at the time of admission showed ejection fraction of 30 to 35%.  Due to his low ejection fraction, it was thought that he would require anticoagulation and thus no further monitoring was performed.    Today, he denies symptoms of palpitations, chest pain, shortness of breath, orthopnea, PND, lower extremity edema, claudication, dizziness, presyncope, syncope, bleeding, or neurologic sequela. The patient is tolerating medications without difficulties.  His CVA, he is greatly improved.  He is gaining his strength back.   Past Medical History:  Diagnosis Date  . CAD (coronary artery disease)   . CHF (congestive heart failure) ([place])   . Frequent unifocal PVCs    Palpitations since early 20s, PVCs and pairs  . Hyperlipidemia   . Hypertension   . Stroke [facility]) 2009   Past Surgical History:  Procedure Laterality Date  . CARDIAC CATHETERIZATION N/A 01/23/2016   Procedure: Right/Left Heart Cath and Coronary Angiography;  Surgeon: Burnell Blanks, MD;  Location: [facility] CV LAB;  Service: Cardiovascular;  Laterality: N/A;  . CARDIAC CATHETERIZATION N/A 01/23/2016   Procedure: Coronary Stent Intervention;  Surgeon:  Burnell Blanks, MD;  Location: [place] CV LAB;  Service: Cardiovascular;  Laterality: N/A;  . HERNIA REPAIR  06/1992  . TEE WITHOUT CARDIOVERSION N/A 01/26/2018   Procedure: TRANSESOPHAGEAL ECHOCARDIOGRAM (TEE);  Surgeon: Jolaine Artist, MD;  Location: [facility] ENDOSCOPY;  Service: Cardiovascular;  Laterality: N/A;     Current Outpatient Medications  Medication Sig Dispense Refill  . apixaban (ELIQUIS) 5 MG TABS tablet Take 1 tablet (5 mg total) by mouth 2 (two) times daily. 60 tablet 1  . Ascorbic Acid (VITAMIN C PO) Take 250 mg by mouth 3 (three) times a week.     Marland Kitchen aspirin 81 MG tablet Take 81 mg by mouth daily.     Marland Kitchen atorvastatin (LIPITOR) 40 MG tablet Take 40 mg by mouth daily.    . carvedilol (COREG) 3.125 MG tablet Take 1 tablet (3.125 mg total) by mouth 2 (two) times daily. 180 tablet 3  . Cholecalciferol (VITAMIN D PO) Take 5,000 Units by mouth daily.    . furosemide (LASIX) 20 MG tablet Take 1 tablet (20 mg total) by mouth daily. (Patient taking differently: Take 20 mg by mouth daily as needed. ) 90 tablet 1  . losartan (COZAAR) 25 MG tablet Take 0.5 tablets (12.5 mg total) by mouth daily. 90 tablet 3  . Magnesium 100 MG TABS Take 1 tablet by mouth 2 (two) times daily.    . Potassium 99 MG TABS Take 99 mg by mouth daily.     . tamsulosin (FLOMAX) 0.4 MG CAPS capsule Take 1 capsule (0.4  mg total) by mouth daily. 30 capsule 3  . vitamin E 400 UNIT capsule Take 400 Units by mouth 3 (three) times a week.     No current facility-administered medications for this visit.     Allergies:   Patient has no known allergies.   Social History:  The patient  reports that he has never smoked. He has never used smokeless tobacco. He reports that he does not drink alcohol or use drugs.   Family History:  The patient's family history includes Cancer in his brother; Early death in his mother; Stroke in his mother.    ROS:  Please see the history of present illness.   Otherwise, review  of systems is positive for appetite change, cough, depression, balance problems, easy bruising.   All other systems are reviewed and negative.    PHYSICAL EXAM: VS:  BP 130/88   Pulse 75   Ht 5\' 9"  (1.753 m)   Wt 189 lb 6.4 oz (85.9 kg)   SpO2 96%   BMI 27.97 kg/m  , BMI Body mass index is 27.97 kg/m. GEN: Well nourished, well developed, in no acute distress  HEENT: normal  Neck: no JVD, carotid bruits, or masses Cardiac: RRR; no murmurs, rubs, or gallops,no edema  Respiratory:  clear to auscultation bilaterally, normal work of breathing GI: soft, nontender, nondistended, + BS MS: no deformity or atrophy  Skin: warm and dry Neuro:  Strength and sensation are intact Psych: euthymic mood, full affect  EKG:  EKG is not ordered today. Personal review of the ekg ordered 5/2/19shows sinus rhythm, frequent PVCs   Device interrogation is reviewed today in detail.  See PaceArt for details.   Recent Labs: 11/09/2017: TSH 3.98 01/21/2018: B Natriuretic Peptide 2,431.3; Magnesium 1.9 01/27/2018: ALT 40; Hemoglobin 13.4; Platelets 125 02/12/2018: BUN 18; Creat 1.10; Potassium 4.7; Sodium 139    Lipid Panel     Component Value Date/Time   CHOL 151 01/22/2018 0720   TRIG 64 01/22/2018 0720   HDL 41 01/22/2018 0720   CHOLHDL 3.7 01/22/2018 0720   VLDL 13 01/22/2018 0720   LDLCALC 97 01/22/2018 0720   LDLCALC 114 (H) 11/09/2017 0841     Wt Readings from Last 3 Encounters:  02/16/18 189 lb 6.4 oz (85.9 kg)  02/12/18 190 lb (86.2 kg)  02/09/18 188 lb (85.3 kg)      Other studies Reviewed: Additional studies/ records that were reviewed today include: TTE 01/25/18  Review of the above records today demonstrates:  - Left ventricle: The cavity size was normal. Systolic function was   severely reduced. The estimated ejection fraction was in the   range of 20% to 25%. Diffuse hypokinesis. Acoustic contrast   opacification revealed no evidence ofthrombus. - Right ventricle: The cavity  size was mildly dilated. Wall   thickness was normal. Systolic function was mildly reduced. - Right atrium: The atrium was mildly dilated.  Holter 11/18/17 - personally reviewed Sinus rhythm Frequent Premature ventricular contractions, ventricular bigeminy with couplets and triplets Frequent Premature atrial contractions.  44% PVCs  ASSESSMENT AND PLAN:  1.  Chronic systolic heart failure due to ischemic cardiomyopathy: Currently on optimal medical therapy.  Is minimally symptomatic.  His EF is below 35% on his previous echo.  We will repeat in 3 months.  2.  Frequent PVCs: Holter monitor showed up to 44%.  Has recently been started on amiodarone which made decrease his PVC burden.  This may help with his low ejection fraction. 3.  CVA: Currently no cause, but due to his low ejection fraction has been put on Eliquis.  He does not wish to wear a monitor at this time.  We will let further discussions occurred between him and neurology.    Current medicines are reviewed at length with the patient today.   The patient does not have concerns regarding his medicines.  The following changes were made today:  none  Labs/ tests ordered today include:  Orders Placed This Encounter  Procedures  . ECHOCARDIOGRAM COMPLETE     Disposition:   FU with Will Camnitz 3 months  Signed, Will Meredith Leeds, MD  02/16/2018 12:19 PM     [address] [place] [address] [phone] (office) [phone] (fax)

## 2018-02-16 NOTE — Patient Instructions (Addendum)
Medication Instructions:  Your physician recommends that you continue on your current medications as directed. Please refer to the Current Medication list given to you today.  Labwork: None ordered  Testing/Procedures: Your physician has requested that you have an echocardiogram in 3 months - before your follow up with Dr. Curt Bears. Echocardiography is a painless test that uses sound waves to create images of your heart. It provides your doctor with information about the size and shape of your heart and how well your heart's chambers and valves are working. This procedure takes approximately one hour. There are no restrictions for this procedure.  Follow-Up: Your physician recommends that you schedule a follow-up appointment in: 3 months with Dr. Curt Bears -  after echocardiogram has been completed   * If you need a refill on your cardiac medications before your next appointment, please call your pharmacy.   *Please note that any paperwork needing to be filled out by the provider will need to be addressed at the front desk prior to seeing the provider. Please note that any FMLA, disability or other documents regarding health condition is subject to a $25.00 charge that must be received prior to completion of paperwork in the form of a money order or check.  Thank you for choosing [facility]!!   Trinidad Curet, RN [phone]

## 2018-03-03 ENCOUNTER — Encounter: Payer: Self-pay | Admitting: Adult Health

## 2018-03-03 ENCOUNTER — Ambulatory Visit (INDEPENDENT_AMBULATORY_CARE_PROVIDER_SITE_OTHER): Payer: PPO | Admitting: Adult Health

## 2018-03-03 VITALS — BP 127/69 | HR 68 | Ht 69.0 in | Wt 192.4 lb

## 2018-03-03 DIAGNOSIS — I10 Essential (primary) hypertension: Secondary | ICD-10-CM

## 2018-03-03 DIAGNOSIS — I5022 Chronic systolic (congestive) heart failure: Secondary | ICD-10-CM

## 2018-03-03 DIAGNOSIS — I63549 Cerebral infarction due to unspecified occlusion or stenosis of unspecified cerebellar artery: Secondary | ICD-10-CM

## 2018-03-03 DIAGNOSIS — E7800 Pure hypercholesterolemia, unspecified: Secondary | ICD-10-CM | POA: Diagnosis not present

## 2018-03-03 NOTE — Patient Instructions (Addendum)
Continue aspirin 81 mg daily and Eliquis (apixaban) daily  and lipitor  for secondary stroke prevention  Continue to follow up with PCP regarding cholesterol and blood pressure management   Speak with PCP regarding concerns with decreased blood pressure and possible need to medication adjustement  Follow up with cardiologist for echo repeat in August  Important to be compliant with all medications!  Continue to monitor blood pressure at home  Maintain strict control of hypertension with blood pressure goal below 130/90, diabetes with hemoglobin A1c goal below 6.5% and cholesterol with LDL cholesterol (bad cholesterol) goal below 70 mg/dL. I also advised the patient to eat a healthy diet with plenty of whole grains, cereals, fruits and vegetables, exercise regularly and maintain ideal body weight.  Followup in the future with me in 4 months or call earlier if needed       Thank you for coming to see [place] at [facility]. I hope we have been able to provide you high quality care today.  You may receive a patient satisfaction survey over the next few weeks. We would appreciate your feedback and comments so that we may continue to improve ourselves and the health of our patients.

## 2018-03-03 NOTE — Progress Notes (Signed)
I agree with the above plan 

## 2018-03-03 NOTE — Progress Notes (Signed)
[facility] [address] [place]. [place] [address] [phone]       OFFICE FOLLOW UP NOTE  [name] Date of Birth:  04-01-1931 Medical Record Number:  [id number]   Reason for Referral:  hospital stroke follow up  CHIEF COMPLAINT:  Chief Complaint  Patient presents with  . Follow-up    Hospital Stroke follow up, Pt seen by Dr. 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 History obtained from patient and chart review. Reviewed all radiology images and labs personally.  Patrick Maxwell is a 84 year old male with history of CAD, CHF, HTN, HLD, PVCs, and stroke who was admitted for vertigo, gait difficulty, and ataxia. He had a stroke about 10 years ago with word [place] and was told he had a small left temporal stroke. His speech resolved in 6 months. This time he was admitted for vertigo, not able to walk, with ataxia on 01/20/18. MRI showed left PICA infarct, and old left BG/CRinfarct. CTA head and neck left PICA occlusion with diffuse intracranial stenosis including right cavernous SCA and right M1. EF 30 to 35%, down from 35 to 40% in 10/2017. He also had a 48-hour Holter monitoring PTAshowed frequent PVCs which he was put on Coreg. A1c 5.7 and LDL 97 this time. His stroke could be due to PICAocclusion due to atherosclerosis or cardioembolic given low EF. Had cardiology consultation, repeat 2D echo with contrast showed EF 20-25% but no LV thrombus. Had TEE again showed EF 20-25% with global HK but no LV or LA thrombus. Given the low EF and embolic stroke, it was discussed with Dr. Cathie Olden, pt and his wife, and decided to start eliquis 5mg  bid and continue ASA 81mg  given hx of CAD. Advised to continue to follow up with cardiology. Also recommended to repeat TTE in 3 months, if EF >  30-35%, eliquis can be discontinued. Patient discharged to CIR.    Patient is being seen today for hospital follow-up and is accompanied by his wife.  He states all symptoms have resolved and has recently graduated from physical therapy but continues to do home exercises.  He states active by taking frequent walks during the day, using treadmill or gardening.  He does continue to be compliant with Eliquis and aspirin with mild bruising but no bleeding.  States that he occasionally will take his Lipitor and his wife states also that "he really does not need this medication due to his levels being good".  Reviewed LDL level 97 and our expectations of LDL less than 70 and reasoning behind wanting a lower cholesterol level and patient states he will be compliant with this medication.  He denies side effects such as myalgias with the Lipitor dose.  Blood pressure satisfactory 127/69.  He does state that he has been having events where he will be sitting in his chair and start to feel lightheaded with blurry vision and will check his blood pressure and it will be 100s/50s with a heart rate of 30s to 40s that will last for approximately 15 minutes.  He is unable to state whether this was occurring prior to the stroke.  He is unable to correlate this to any activity, position change or medication administration.  He has recently decreased Pacerone on own but did advise PCP and  cardiologist as he was having weird dreams and these dreams resolved since decreasing dose.  He will be having a 2D echo on 05/19/2018 to follow-up on low EF.  He did have recent follow-up visit with cardiologist with no new orders at this time.  Advised wife and patient to notify PCP and cardiologist in regards to symptomatic hypotension episodes.  Patient denies new or worsening stroke/TIA symptoms.    ROS:   14 system review of systems performed and negative with exception of easy bruising, feeling hot, feeling cold, hearing loss, urination  problems, numbness, weakness, dizziness, depression, anxiety and sleepiness  PMH:  Past Medical History:  Diagnosis Date  . CAD (coronary artery disease)   . CHF (congestive heart failure) ([place])   . Frequent unifocal PVCs    Palpitations since early 20s, PVCs and pairs  . Hyperlipidemia   . Hypertension   . Stroke [facility]) 2009    PSH:  Past Surgical History:  Procedure Laterality Date  . CARDIAC CATHETERIZATION N/A 01/23/2016   Procedure: Right/Left Heart Cath and Coronary Angiography;  Surgeon: Burnell Blanks, MD;  Location: [place] [facility];  Service: Cardiovascular;  Laterality: N/A;  . CARDIAC CATHETERIZATION N/A 01/23/2016   Procedure: Coronary Stent Intervention;  Surgeon: Burnell Blanks, MD;  Location: [place] [facility];  Service: Cardiovascular;  Laterality: N/A;  . HERNIA REPAIR  06/1992  . TEE WITHOUT CARDIOVERSION N/A 01/26/2018   Procedure: TRANSESOPHAGEAL ECHOCARDIOGRAM (TEE);  Surgeon: Jolaine Artist, MD;  Location: [facility] ENDOSCOPY;  Service: Cardiovascular;  Laterality: N/A;    Social History:  Social History   Socioeconomic History  . Marital status: Married    Spouse name: Not on file  . Number of children: Not on file  . Years of education: Not on file  . Highest education level: Not on file  Occupational History  . Not on file  Social Needs  . Financial resource strain: Not on file  . Food insecurity:    Worry: Not on file    Inability: Not on file  . Transportation needs:    Medical: Not on file    Non-medical: Not on file  Tobacco Use  . Smoking status: Never Smoker  . Smokeless tobacco: Never Used  Substance and Sexual Activity  . Alcohol use: No  . Drug use: No  . Sexual activity: Not Currently  Lifestyle  . Physical activity:    Days per week: Not on file    Minutes per session: Not on file  . Stress: Not on file  Relationships  . Social connections:    Talks on phone: Not on file    Gets together: Not on file     Attends religious service: Not on file    Active member of club or organization: Not on file    Attends meetings of clubs or organizations: Not on file    Relationship status: Not on file  . Intimate partner violence:    Fear of current or ex partner: Not on file    Emotionally abused: Not on file    Physically abused: Not on file    Forced sexual activity: Not on file  Other Topics Concern  . Not on file  Social History Narrative  . Not on file    Family History:  Family History  Problem Relation Age of Onset  . Stroke Mother   . Early death Mother   . Cancer Brother     Medications:   Current Outpatient Medications  on File Prior to Visit  Medication Sig Dispense Refill  . amiodarone (PACERONE) 200 MG tablet Take 200 mg by mouth daily.    Marland Kitchen apixaban (ELIQUIS) 5 MG TABS tablet Take 1 tablet (5 mg total) by mouth 2 (two) times daily. 60 tablet 1  . Ascorbic Acid (VITAMIN C PO) Take 250 mg by mouth 3 (three) times a week.     Marland Kitchen aspirin 81 MG tablet Take 81 mg by mouth daily.     Marland Kitchen atorvastatin (LIPITOR) 40 MG tablet Take 40 mg by mouth daily.    . carvedilol (COREG) 3.125 MG tablet Take 1 tablet (3.125 mg total) by mouth 2 (two) times daily. 180 tablet 3  . Cholecalciferol (VITAMIN D PO) Take 5,000 Units by mouth daily.    . Coenzyme Q10 (COQ10 PO) Take 50 mg by mouth.    . furosemide (LASIX) 20 MG tablet Take 1 tablet (20 mg total) by mouth daily. (Patient taking differently: Take 20 mg by mouth daily as needed. ) 90 tablet 1  . losartan (COZAAR) 25 MG tablet Take 0.5 tablets (12.5 mg total) by mouth daily. 90 tablet 3  . Magnesium 100 MG TABS Take 1 tablet by mouth 2 (two) times daily.    . Potassium 99 MG TABS Take 99 mg by mouth daily.     . tamsulosin (FLOMAX) 0.4 MG CAPS capsule Take 1 capsule (0.4 mg total) by mouth daily. 30 capsule 3  . vitamin E 400 UNIT capsule Take 400 Units by mouth 3 (three) times a week.     No current facility-administered medications on file  prior to visit.     Allergies:  No Known Allergies   Physical Exam  Vitals:   03/03/18 1106  BP: 127/69  Pulse: 68  Weight: 192 lb 6.4 oz (87.3 kg)  Height: 5\' 9"  (1.753 m)   Body mass index is 28.41 kg/m. No exam data present  General: well developed, pleasant elderly Caucasian male, well nourished, seated, in no evident distress Head: head normocephalic and atraumatic.   Neck: supple with no carotid or supraclavicular bruits Cardiovascular: regular rate and rhythm, no murmurs Musculoskeletal: no deformity Skin:  no rash/petichiae Vascular:  Normal pulses all extremities  Neurologic Exam Mental Status: Awake and fully alert. Oriented to place and time. Recent and remote memory intact. Attention span, concentration and fund of knowledge appropriate. Mood and affect appropriate.  Cranial Nerves: Fundoscopic exam reveals sharp disc margins. Pupils equal, briskly reactive to light. Extraocular movements full without nystagmus. Visual fields full to confrontation. Hearing intact. Facial sensation intact. Face, tongue, palate moves normally and symmetrically.  Motor: Normal bulk and tone. Normal strength in all tested extremity muscles. Sensory.: intact to touch , pinprick , position and vibratory sensation.  Coordination: Rapid alternating movements normal in all extremities. Finger-to-nose and heel-to-shin performed accurately bilaterally. Gait and Station: Arises from chair without difficulty. Stance is normal. Gait demonstrates normal stride length and balance . Able to heel, toe and tandem walk without difficulty.  Reflexes: 1+ and symmetric. Toes downgoing.    NIHSS  0 Modified Rankin  2   Diagnostic Data (Labs, Imaging, Testing)  CT Angio Head/Neck W Or Wo Contrast   01/21/2018 IMPRESSION:  1. Negative for large vessel occlusion. 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  2. There is little extracranial atherosclerosis, with no cervical carotid or  vertebral artery stenosis. But there is moderate generalized intracranial atherosclerosis. Mostly mild intracranial arterial stenoses are noted -  including in both vertebral artery V4 segments - with the following exceptions: - moderate right ICA siphon supraclinoid stenosis due to calcified plaque. - moderate right MCA bifurcation stenosis. - moderate left ACA A2 segment stenosis. - moderate and severe left MCA anterior division M3 stenoses.  3. Stable appearance of the acute left PICA infarct from the MRI earlier today. Cytotoxic edema with no hemorrhage or mass effect at this time.  4. Aortic Atherosclerosis (ICD10-I70.0).   MR Brain Wo Contrast  01/21/2018 IMPRESSION: 1. Acute left posterior inferior cerebellar artery territory infarct without hemorrhage or mass effect  2. Chronic small vessel disease with old left basal ganglia and corona radiata lacunar infarcts.   Dg Chest Portable 1 View: 01/21/2018 IMPRESSION:  Cardiomegaly with aortic atherosclerosis.  Mild CHF.  2D Echocardiogram 01/21/18 Compared to a prior study in 10/2017, the LVEF is lower at 30-35%. Cannot exclude apical thrombus - recommend limited Definity contrast study to evaluate.  EKG: SR with bigeminy.   TTE with contrast  - Left ventricle: The cavity size was normal. Systolic function was severely reduced. The estimated ejection fraction was in the range of 20% to 25%. Diffuse hypokinesis. Acoustic contrast opacification revealed no evidence ofthrombus. - Right ventricle: The cavity size was mildly dilated. Wall thickness was normal. Systolic function was mildly reduced. - Right atrium: The atrium was mildly dilated.  TEE  LEFT VENTRICLE: EF =20-25%.Dilated. Severe global HK. No apical clot RIGHT VENTRICLE:Moderately to severe global HK LEFT ATRIUM:Markedly dilated. LA diameter +6.6cm. + smoke  LEFT ATRIAL APPENDAGE: No thrombus.  RIGHT ATRIUM:Normal AORTIC VALVE: Trileaflet. Mildly  calcified. Trivial AI MITRAL VALVE: Normal. Mild to moderate central MR TRICUSPID VALVE: Normal.Moderate to severe TR. RVSP ~66mmHG PULMONIC VALVE: Grossly normal.Mild PI. INTERATRIAL SEPTUM:Borderline ASA.No PFO or ASD.Bubble study negative PERICARDIUM: No effusion DESCENDING AORTA:Severe plaque.  Frequent PVCs and bigeminy. 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    ASSESSMENT: Patrick Maxwell is a 84 y.o. 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 Vascular risk factors include CAD, CHF, HTN, HLD and PVCs.    PLAN: -Continue aspirin 81 mg daily and Eliquis (apixaban) daily  and Lipitor for secondary stroke prevention -Advised patient and wife importance of compliance with medications that are prescribed by providers -F/u with PCP regarding your HLD, HTN, and CAD management -advised to contact in regards to recent episodes for possible medication management -f/u with cardiologist for echo in August -Advised that if these episodes continue or SBP less than 90s with sustained bradycardia, to call EMS -wife stated that she is planning on calling PCP or cardiologist today in regards to these events -continue to monitor BP at home -Maintain strict control of hypertension with blood pressure goal below 130/90, diabetes with hemoglobin A1c goal below 6.5% and cholesterol with LDL cholesterol (bad cholesterol) goal below 70 mg/dL. I also advised the patient to eat a healthy diet with plenty of whole grains, cereals, fruits and vegetables, exercise regularly and maintain ideal body weight.  Follow up in 4 months or call earlier if needed  Greater than 50% of time during this 25 minute visit was spent on counseling,explanation of diagnosis of left PICA infarct, reviewing risk factor management of CAD, CHF, HTN, HLD and PVCs, planning of further management, discussion with patient and family  and coordination of care  Venancio Poisson, [facility]  [facility] [address] [place], [place] [address]  Phone [phone] Fax  [phone]       

## 2018-03-29 ENCOUNTER — Ambulatory Visit (HOSPITAL_BASED_OUTPATIENT_CLINIC_OR_DEPARTMENT_OTHER): Payer: PPO | Admitting: Physical Medicine & Rehabilitation

## 2018-03-29 ENCOUNTER — Encounter: Payer: PPO | Attending: Registered Nurse

## 2018-03-29 ENCOUNTER — Encounter: Payer: Self-pay | Admitting: Physical Medicine & Rehabilitation

## 2018-03-29 VITALS — BP 144/87 | HR 70 | Resp 14 | Ht 70.0 in | Wt 194.0 lb

## 2018-03-29 DIAGNOSIS — I110 Hypertensive heart disease with heart failure: Secondary | ICD-10-CM | POA: Diagnosis not present

## 2018-03-29 DIAGNOSIS — Z789 Other specified health status: Secondary | ICD-10-CM | POA: Diagnosis not present

## 2018-03-29 DIAGNOSIS — Z8673 Personal history of transient ischemic attack (TIA), and cerebral infarction without residual deficits: Secondary | ICD-10-CM | POA: Insufficient documentation

## 2018-03-29 DIAGNOSIS — I2510 Atherosclerotic heart disease of native coronary artery without angina pectoris: Secondary | ICD-10-CM | POA: Insufficient documentation

## 2018-03-29 DIAGNOSIS — Z09 Encounter for follow-up examination after completed treatment for conditions other than malignant neoplasm: Secondary | ICD-10-CM | POA: Insufficient documentation

## 2018-03-29 DIAGNOSIS — E785 Hyperlipidemia, unspecified: Secondary | ICD-10-CM | POA: Diagnosis not present

## 2018-03-29 DIAGNOSIS — F329 Major depressive disorder, single episode, unspecified: Secondary | ICD-10-CM | POA: Insufficient documentation

## 2018-03-29 DIAGNOSIS — I509 Heart failure, unspecified: Secondary | ICD-10-CM | POA: Insufficient documentation

## 2018-03-29 DIAGNOSIS — Z955 Presence of coronary angioplasty implant and graft: Secondary | ICD-10-CM | POA: Insufficient documentation

## 2018-03-29 DIAGNOSIS — I63549 Cerebral infarction due to unspecified occlusion or stenosis of unspecified cerebellar artery: Secondary | ICD-10-CM | POA: Diagnosis not present

## 2018-03-29 NOTE — Progress Notes (Signed)
Subjective:    Patient ID: Patrick Maxwell, male    DOB: 1930-11-06, 84 y.o.   MRN: [id number] 84 year old right-handed male, history of CVA in 2009, maintained on aspirin, chronic systolic congestive heart failure, CAD, and bradycardia.  Lives with spouse, independent prior to admission.  Presented  01/21/2018 with vertigo and tingling of the left side as well as noted to be ataxic.  MRI showed left cerebellar infarction.  Per report acute left posterior inferior cerebellar infarction with chronic small vessel disease as well as old left basal  ganglia and corona radiata infarctions.  CT angiogram negative for large vessel occlusion, left PICA partial thrombosis.  Echocardiogram with ejection fraction of 35% with diffuse hypokinesis.  Neurology consulted.  Initially maintained on aspirin and  Plavix for CVA prophylaxis.  Repeat echocardiogram 01/25/2018 to rule out apical thrombus negative.  There was noted diffuse hypokinesis.  Subcutaneous heparin for DVT prophylaxis.  TEE showed no thrombus  HPI  Seen by PCP Dr Buelah Manis Seen by Dr Curt Bears electrophysiology  Some mild Sensation  Mowing the yard Modified independent with all self-care and mobility.  Does not use an assistive device. No falls.  Tried driving yesterday with wife, no issues No new vision issues No swallowing issues Completed HHPT, HHOT 6 visits  Pain Inventory Average Pain 0 Pain Right Now 0 My pain is no pain  In the last 24 hours, has pain interfered with the following? General activity 0 Relation with others 0 Enjoyment of life 0 What TIME of day is your pain at its worst? no pain Sleep (in general) Good  Pain is worse with: no pain Pain improves with: no pain Relief from Meds: no pain  Mobility walk without assistance  Function retired  Neuro/Psych No problems in this area  Prior Studies Any changes since last visit?  no  Physicians involved in your care Any changes since last visit?   no   Family History  Problem Relation Age of Onset  . Stroke Mother   . Early death Mother   . Cancer Brother    Social History   Socioeconomic History  . Marital status: Married    Spouse name: Not on file  . Number of children: Not on file  . Years of education: Not on file  . Highest education level: Not on file  Occupational History  . Not on file  Social Needs  . Financial resource strain: Not on file  . Food insecurity:    Worry: Not on file    Inability: Not on file  . Transportation needs:    Medical: Not on file    Non-medical: Not on file  Tobacco Use  . Smoking status: Never Smoker  . Smokeless tobacco: Never Used  Substance and Sexual Activity  . Alcohol use: No  . Drug use: No  . Sexual activity: Not Currently  Lifestyle  . Physical activity:    Days per week: Not on file    Minutes per session: Not on file  . Stress: Not on file  Relationships  . Social connections:    Talks on phone: Not on file    Gets together: Not on file    Attends religious service: Not on file    Active member of club or organization: Not on file    Attends meetings of clubs or organizations: Not on file    Relationship status: Not on file  Other Topics Concern  . Not on file  Social History Narrative  .  Not on file   Past Surgical History:  Procedure Laterality Date  . CARDIAC CATHETERIZATION N/A 01/23/2016   Procedure: Right/Left Heart Cath and Coronary Angiography;  Surgeon: Burnell Blanks, MD;  Location: [place] [facility];  Service: Cardiovascular;  Laterality: N/A;  . CARDIAC CATHETERIZATION N/A 01/23/2016   Procedure: Coronary Stent Intervention;  Surgeon: Burnell Blanks, MD;  Location: [place] [facility];  Service: Cardiovascular;  Laterality: N/A;  . HERNIA REPAIR  06/1992  . TEE WITHOUT CARDIOVERSION N/A 01/26/2018   Procedure: TRANSESOPHAGEAL ECHOCARDIOGRAM (TEE);  Surgeon: Jolaine Artist, MD;  Location: [facility] ENDOSCOPY;  Service: Cardiovascular;   Laterality: N/A;   Past Medical History:  Diagnosis Date  . CAD (coronary artery disease)   . CHF (congestive heart failure) ([place])   . Frequent unifocal PVCs    Palpitations since early 20s, PVCs and pairs  . Hyperlipidemia   . Hypertension   . Stroke ([place]) 2009   BP (!) 144/87 (BP Location: Left Arm, Patient Position: Sitting, Cuff Size: Normal)   Pulse 70   Resp 14   Ht 5\' 10"  (1.778 m)   Wt 194 lb (88 kg)   SpO2 95%   BMI 27.84 kg/m   Opioid Risk Score:   Fall Risk Score:  `1  Depression screen PHQ 2/9  Depression screen [facility] 2/9 02/09/2018 12/11/2017 12/07/2017 11/09/2017 08/17/2017 01/06/2017 12/19/2015  Decreased Interest 1 0 0 0 0 0 0  Down, Depressed, Hopeless 1 0 0 0 0 0 0  PHQ - 2 Score 2 0 0 0 0 0 0  Altered sleeping 0 - - - - 0 -  Tired, decreased energy 0 - - - - 0 -  Change in appetite 1 - - - - 0 -  Feeling bad or failure about yourself  0 - - - - 0 -  Trouble concentrating 0 - - - - 0 -  Moving slowly or fidgety/restless 0 - - - - 0 -  Suicidal thoughts 0 - - - - 0 -  PHQ-9 Score 3 - - - - 0 -  Difficult doing work/chores - - - - - Not difficult at all -    Review of Systems  Constitutional: Negative.   HENT: Negative.   Eyes: Negative.   Respiratory: Negative.   Cardiovascular: Negative.   Gastrointestinal: Negative.   Endocrine: Negative.   Genitourinary: Negative.   Musculoskeletal: Negative.   Skin: Negative.   Allergic/Immunologic: Negative.   Neurological: Negative.   Hematological: Negative.   Psychiatric/Behavioral: Negative.   All other systems reviewed and are negative.      Objective:   Physical Exam  Constitutional: He is oriented to person, place, and time. He appears well-developed and well-nourished. No distress.  HENT:  Head: Normocephalic and atraumatic.  Eyes: Pupils are equal, round, and reactive to light. EOM are normal.  Neck: Normal range of motion.  Neurological: He is alert and oriented to person, place, and time.    Skin: Skin is warm and dry. He is not diaphoretic.  Psychiatric: He has a normal mood and affect.  Nursing note and vitals reviewed. Motor strength is 5/5 bilateral deltoid, bicep, tricep, grip, hip flexor, knee extensor, ankle dorsiflexor Mild dysdiadochokinesis with rapid alternating supination pronation left upper extremity Mildly reduced left finger thumb opposition         Assessment & Plan:  1.  History of left cerebellar infarct with mild residual fine motor deficits and reduced rapid alternating motions but otherwise had  an independent level.  He may return to driving without restrictions. No physical medicine rehab follow-up needed.  No need for further outpatient therapy Follow-up with PCP as well as neurology for secondary stroke prophylaxis.

## 2018-03-29 NOTE — Patient Instructions (Signed)
May resume Driving

## 2018-04-13 ENCOUNTER — Other Ambulatory Visit: Payer: Self-pay | Admitting: Cardiology

## 2018-05-12 ENCOUNTER — Other Ambulatory Visit: Payer: Self-pay

## 2018-05-12 ENCOUNTER — Ambulatory Visit (INDEPENDENT_AMBULATORY_CARE_PROVIDER_SITE_OTHER): Payer: PPO | Admitting: Family Medicine

## 2018-05-12 ENCOUNTER — Encounter: Payer: Self-pay | Admitting: Family Medicine

## 2018-05-12 ENCOUNTER — Ambulatory Visit (HOSPITAL_COMMUNITY): Payer: PPO | Attending: Cardiology

## 2018-05-12 VITALS — BP 128/70 | HR 80 | Temp 98.5°F | Resp 14 | Ht 70.0 in | Wt 196.0 lb

## 2018-05-12 DIAGNOSIS — I083 Combined rheumatic disorders of mitral, aortic and tricuspid valves: Secondary | ICD-10-CM | POA: Insufficient documentation

## 2018-05-12 DIAGNOSIS — I255 Ischemic cardiomyopathy: Secondary | ICD-10-CM | POA: Diagnosis not present

## 2018-05-12 DIAGNOSIS — R972 Elevated prostate specific antigen [PSA]: Secondary | ICD-10-CM

## 2018-05-12 DIAGNOSIS — Z8673 Personal history of transient ischemic attack (TIA), and cerebral infarction without residual deficits: Secondary | ICD-10-CM

## 2018-05-12 DIAGNOSIS — I493 Ventricular premature depolarization: Secondary | ICD-10-CM | POA: Diagnosis not present

## 2018-05-12 DIAGNOSIS — I119 Hypertensive heart disease without heart failure: Secondary | ICD-10-CM | POA: Diagnosis not present

## 2018-05-12 DIAGNOSIS — Z0000 Encounter for general adult medical examination without abnormal findings: Secondary | ICD-10-CM | POA: Diagnosis not present

## 2018-05-12 DIAGNOSIS — N400 Enlarged prostate without lower urinary tract symptoms: Secondary | ICD-10-CM

## 2018-05-12 DIAGNOSIS — E785 Hyperlipidemia, unspecified: Secondary | ICD-10-CM | POA: Insufficient documentation

## 2018-05-12 DIAGNOSIS — E7800 Pure hypercholesterolemia, unspecified: Secondary | ICD-10-CM | POA: Diagnosis not present

## 2018-05-12 LAB — ECHOCARDIOGRAM COMPLETE
HEIGHTINCHES: 70 in
WEIGHTICAEL: 3136 [oz_av]

## 2018-05-12 MED ORDER — PERFLUTREN LIPID MICROSPHERE
1.0000 mL | INTRAVENOUS | Status: AC | PRN
  Administered 2018-05-12: 2 mL via INTRAVENOUS

## 2018-05-12 MED ORDER — ATORVASTATIN CALCIUM 40 MG PO TABS: ORAL_TABLET | ORAL | 3 refills | Status: DC

## 2018-05-12 NOTE — Patient Instructions (Addendum)
Take the lipitor three times a week  Take the vitamin D level  F/U 6 months

## 2018-05-12 NOTE — Progress Notes (Signed)
Subjective:   Patient presents for Medicare Annual/Subsequent preventive examination.    Doing well since her stroke.  His wife called in yesterday states she was worried about his memory he gets frustrated very easily.  He admits that he does not like it if they are not listening when he is speaking.  He has had some memory changes but overall feels like he is doing well.  She then states that the changes are only like a 3 out of 10 want reassurance that this is normal after stroke.  We also discussed his medications.  He is stopped taking his amiodarone he is taking both of his carvedilol's in the morning for some reason.  He is also stopped taking his statin drug Lipitor.   Morning meds- Losartan, Eliquis  Stopped amiodraone   PRN Lasix  Evening- Flomax  Statin- stopped       Review Past Medical/Family/Social: Per EMR   Risk Factors  Current exercise habits: Stays active walks Dietary issues discussed: No major concerns  Cardiac risk factors: Obesity (BMI >= 30 kg/m2).  Stroke, heart failure   Depression Screen  (Note: if answer to either of the following is "Yes", a more complete depression screening is indicated)  Over the past two weeks, have you felt down, depressed or hopeless? No Over the past two weeks, have you felt little interest or pleasure in doing things? No Have you lost interest or pleasure in daily life? No Do you often feel hopeless? No Do you cry easily over simple problems? No   Activities of Daily Living  In your present state of health, do you have any difficulty performing the following activities?:  Driving? No  Managing money? No  Feeding yourself? No  Getting from bed to chair? No  Climbing a flight of stairs? No  Preparing food and eating?: No  Bathing or showering? No  Getting dressed: No  Getting to the toilet? No  Using the toilet:No  Moving around from place to place: No  In the past year have you fallen or had a near fall?:No  Are you  sexually active? No  Do you have more than one partner? No   Hearing Difficulties:   Do you often ask people to speak up or repeat themselves? No  Do you experience ringing or noises in your ears? No Do you have difficulty understanding soft or whispered voices? No  Do you feel that you have a problem with memory?yes Do you often misplace items? No  Do you feel safe at home? Yes  Cognitive Testing  Alert? Yes Normal Appearance?Yes  Oriented to person? Yes Place? Yes  Time? Yes  Recall of three objects? Yes  Can perform simple calculations? Yes  Displays appropriate judgment?Yes  Can read the correct time from a watch face?Yes   List the Names of Other Physician/Practitioners you currently use:  Cardiology, nephrology    Screening Tests / Date declined ALL Colonoscopy                     Zostavax  pneumonia  Influenza Vaccine  Tetanus/tdap  ROS: GEN- denies fatigue, fever, weight loss,weakness, recent illness HEENT- denies eye drainage, change in vision, nasal discharge, CVS- denies chest pain, palpitations RESP- denies SOB, cough, wheeze ABD- denies N/V, change in stools, abd pain GU- denies dysuria, hematuria, dribbling, incontinence MSK- denies joint pain, muscle aches, injury Neuro- denies headache, dizziness, syncope, seizure activity  Physical: GEN- NAD, alert and oriented x3 HEENT- PERRL,  EOMI, non injected sclera, pink conjunctiva, MMM, oropharynx clear Neck- Supple, no thryomegaly CVS- RRR, no murmur RESP-CTAB EXT- No edema Pulses- Radial, DP- 2+   Assessment:    Annual wellness medicare exam   Plan:    During the course of the visit the patient was educated and counseled about appropriate screening and preventive services including:  CAGE/depression/fall screening negative Discussed advanced directives given handout  History of stroke discussed the importance of his medications which have done multiple times but he will take what he wants to.   Advised to take the carvedilol twice a day this will also help with the PVCs that he has.  He is can continue with the Eliquis.  He agrees to take the statin drug 3 times a week.  He declines any immunizations or preventative work-up he is over age as far as colonoscopy.  Known BPH with elevated PSA he declines following this further or treatment  Reviewed his recent labs  Memory changes frustrations discussed that this can be normal after a stroke.  He does not seem like it is bothering his quality of life.  Sometimes it can be difficult to tease out what is important between him and his wife today seem to be downplayed more than when she called in yesterday very concerned.  He is staying active reading books doing puzzles.  Overall sleeping well appetite is good  About vitamin D she wants to give him drops advised that this is okay to give.  Diet review for nutrition referral? Yes ____ Not Indicated __x__  Patient Instructions (the written plan) was given to the patient.  Medicare Attestation  I have personally reviewed:  The patient's medical and social history  Their use of alcohol, tobacco or illicit drugs  Their current medications and supplements  The patient's functional ability including ADLs,fall risks, home safety risks, cognitive, and hearing and visual impairment  Diet and physical activities  Evidence for depression or mood disorders  The patient's weight, height, BMI, and visual acuity have been recorded in the chart. I have made referrals, counseling, and provided education to the patient based on review of the above and I have provided the patient with a written personalized care plan for preventive services.

## 2018-05-19 ENCOUNTER — Encounter: Payer: Self-pay | Admitting: Cardiology

## 2018-05-19 ENCOUNTER — Ambulatory Visit (INDEPENDENT_AMBULATORY_CARE_PROVIDER_SITE_OTHER): Payer: PPO | Admitting: Cardiology

## 2018-05-19 VITALS — BP 148/82 | HR 83 | Ht 69.0 in | Wt 195.0 lb

## 2018-05-19 DIAGNOSIS — I5042 Chronic combined systolic (congestive) and diastolic (congestive) heart failure: Secondary | ICD-10-CM | POA: Diagnosis not present

## 2018-05-19 DIAGNOSIS — I255 Ischemic cardiomyopathy: Secondary | ICD-10-CM | POA: Diagnosis not present

## 2018-05-19 DIAGNOSIS — I493 Ventricular premature depolarization: Secondary | ICD-10-CM

## 2018-05-19 MED ORDER — MEXILETINE HCL 150 MG PO CAPS: 300.0000 mg | ORAL_CAPSULE | Freq: Two times a day (BID) | ORAL | 11 refills | Status: DC

## 2018-05-19 NOTE — Progress Notes (Signed)
Electrophysiology Office Note   Date:  05/19/2018   ID:  Cove, [name] 05-15-1931, MRN [id number]  PCP:  [name], MD  Cardiologist:  Angelena Form Primary Electrophysiologist:  Charolette Bultman Meredith Leeds, MD    No chief complaint on file.    History of Present Illness: Patrick Maxwell is a 84 y.o. male who is being seen today for the evaluation of CVA at the request of Dr. Erlinda Hong. Presenting today for electrophysiology evaluation.  He has a history of coronary artery disease, frequent PVCs, hypertension, hyperlipidemia, CVA, and systolic heart failure he has been to the hospital on 01/20/2018 with acute onset of ataxia and vertigo.  He was found to have a CVA.  He was also noted to have frequent PVCs.  Echocardiogram at the time of admission showed ejection fraction of 30 to 35%.  Due to his low ejection fraction, it was thought that he would require anticoagulation and thus no further monitoring was performed.   Today, denies symptoms of palpitations, chest pain, shortness of breath, orthopnea, PND, lower extremity edema, claudication, dizziness, presyncope, syncope, bleeding, or neurologic sequela. The patient is tolerating medications without difficulties.  Overall he is feeling well.  He does not have chest pain or shortness of breath.  He is able to do all of his daily activities.  He had a recent echocardiogram that showed his ejection fraction has reduced down to 20 to 25%.   Past Medical History:  Diagnosis Date  . Angina decubitus ([place])   . BPH with elevated PSA 01/06/2017   Has seen urology in the past, declines any treatment  . Bradycardia 04/20/2017  . CAD (coronary artery disease)   . Cerebellar infarct ([place])   . 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 CHF (congestive heart failure) ([place])   . Coronary artery disease involving native coronary artery of native heart with unstable angina pectoris ([place])   . Frequent unifocal PVCs    Palpitations  since early 20s, PVCs and pairs  . History of CVA (cerebrovascular accident) without residual deficits 12/20/2010   CVA Nov 2007   . Hypercholesteremia 01/17/2011   Currently taking red yeast rice extract   . Hyperlipidemia   . Hypertension   . Hypertension, benign 12/20/2010   hypertension   . Ischemic cardiomyopathy   . Palpitations 12/25/2015   palpitations   . Prediabetes   . PVC's (premature ventricular contractions) 01/23/2016  . Stroke [facility]) 2009  . Subclinical hypothyroidism 12/21/2015   Past Surgical History:  Procedure Laterality Date  . CARDIAC CATHETERIZATION N/A 01/23/2016   Procedure: Right/Left Heart Cath and Coronary Angiography;  Surgeon: Burnell Blanks, MD;  Location: [place] CV LAB;  Service: Cardiovascular;  Laterality: N/A;  . CARDIAC CATHETERIZATION N/A 01/23/2016   Procedure: Coronary Stent Intervention;  Surgeon: Burnell Blanks, MD;  Location: [place] CV LAB;  Service: Cardiovascular;  Laterality: N/A;  . HERNIA REPAIR  06/1992  . TEE WITHOUT CARDIOVERSION N/A 01/26/2018   Procedure: TRANSESOPHAGEAL ECHOCARDIOGRAM (TEE);  Surgeon: Jolaine Artist, MD;  Location: [facility] ENDOSCOPY;  Service: Cardiovascular;  Laterality: N/A;     Current Outpatient Medications  Medication Sig Dispense Refill  . Ascorbic Acid (VITAMIN C PO) Take 250 mg by mouth 3 (three) times a week.     Marland Kitchen aspirin 81 MG tablet Take 81 mg by mouth daily.     Marland Kitchen atorvastatin (LIPITOR) 40 MG tablet Take 1 tablet three times a week 12 tablet 3  .  carvedilol (COREG) 3.125 MG tablet Take 3.125 mg by mouth 2 (two) times daily with a meal.    . Cholecalciferol (VITAMIN D PO) Take 5,000 Units by mouth daily.    . Coenzyme Q10 (COQ10 PO) Take 50 mg by mouth.    Arne Cleveland 5 MG TABS tablet TAKE 1 TABLET TWICE A DAY 1801 tablet 1  . furosemide (LASIX) 20 MG tablet Take 20 mg by mouth as needed for fluid.    Marland Kitchen losartan (COZAAR) 25 MG tablet Take 0.5 tablets (12.5 mg total) by mouth daily. 90  tablet 3  . Magnesium 100 MG TABS Take 1 tablet by mouth 2 (two) times daily.    . Potassium 99 MG TABS Take 99 mg by mouth daily.     . tamsulosin (FLOMAX) 0.4 MG CAPS capsule Take 1 capsule (0.4 mg total) by mouth daily. 30 capsule 3  . vitamin E 400 UNIT capsule Take 400 Units by mouth 3 (three) times a week.     No current facility-administered medications for this visit.     Allergies:   Patient has no known allergies.   Social History:  The patient  reports that he has never smoked. He has never used smokeless tobacco. He reports that he does not drink alcohol or use drugs.   Family History:  The patient's family history includes Cancer in his brother; Early death in his mother; Stroke in his mother.    ROS:  Please see the history of present illness.   Otherwise, review of systems is positive for palpitations, easy bruising.   All other systems are reviewed and negative.   PHYSICAL EXAM: VS:  BP (!) 148/82   Pulse 83   Ht 5\' 9"  (1.753 m)   Wt 195 lb (88.5 kg)   SpO2 97%   BMI 28.80 kg/m  , BMI Body mass index is 28.8 kg/m. GEN: Well nourished, well developed, in no acute distress  HEENT: normal  Neck: no JVD, carotid bruits, or masses Cardiac: RRR; no murmurs, rubs, or gallops,no edema  Respiratory:  clear to auscultation bilaterally, normal work of breathing GI: soft, nontender, nondistended, + BS MS: no deformity or atrophy  Skin: warm and dry Neuro:  Strength and sensation are intact Psych: euthymic mood, full affect  EKG:  EKG is ordered today. Personal review of the ekg ordered shows this rhythm, frequent PVCs, rate 83  Recent Labs: 11/09/2017: TSH 3.98 01/21/2018: B Natriuretic Peptide 2,431.3; Magnesium 1.9 01/27/2018: ALT 40; Hemoglobin 13.4; Platelets 125 02/12/2018: BUN 18; Creat 1.10; Potassium 4.7; Sodium 139    Lipid Panel     Component Value Date/Time   CHOL 151 01/22/2018 0720   TRIG 64 01/22/2018 0720   HDL 41 01/22/2018 0720   CHOLHDL 3.7  01/22/2018 0720   VLDL 13 01/22/2018 0720   LDLCALC 97 01/22/2018 0720   LDLCALC 114 (H) 11/09/2017 0841     Wt Readings from Last 3 Encounters:  05/19/18 195 lb (88.5 kg)  05/12/18 196 lb (88.9 kg)  03/29/18 194 lb (88 kg)      Other studies Reviewed: Additional studies/ records that were reviewed today include: TTE 05/12/18 Review of the above records today demonstrates:  - Left ventricle: The cavity size was mildly dilated. Wall   thickness was increased in a pattern of mild LVH. Systolic   function was severely reduced. The estimated ejection fraction   was in the range of 20% to 25%. Diffuse hypokinesis. Akinesis of   the apical  myocardium. Features are consistent with a   pseudonormal left ventricular filling pattern, with concomitant   abnormal relaxation and increased filling pressure (grade 2   diastolic dysfunction). Doppler parameters are consistent with   high ventricular filling pressure. - Aortic valve: Transvalvular velocity was within the normal range.   There was no stenosis. There was mild regurgitation. - Mitral valve: Transvalvular velocity was within the normal range.   There was no evidence for stenosis. There was mild regurgitation. - Left atrium: The atrium was severely dilated. - Right ventricle: The cavity size was normal. Wall thickness was   normal. Systolic function was normal. - Tricuspid valve: There was mild-moderate regurgitation. - Pulmonary arteries: Systolic pressure was severely increased. PA   peak pressure: 60 mm Hg (S).  Holter 11/18/17 - personally reviewed Sinus rhythm Frequent Premature ventricular contractions, ventricular bigeminy with couplets and triplets Frequent Premature atrial contractions.  44% PVCs  ASSESSMENT AND PLAN:  1.  Chronic systolic heart failure due to ischemic cardiomyopathy: He on optimal medical therapy.  His ejection fraction has reduced even further down to 20 to 25%.  This could possibly be due to his PVC  burden.  2.  Frequent PVCs: Up to 44% on his Holter monitor.  He was started on amiodarone which decreased his PVC burden but he did not tolerate it due to bad dreams.  We Madalin Hughart start mexiletine 300 mg twice a day.  3.  CVA: No obvious cause but due to his low ejection fraction he has been put on Eliquis.  He does not wish to have a monitor at this time.  Plan for anticoagulation per neurology.    Current medicines are reviewed at length with the patient today.   The patient does not have concerns regarding his medicines.  The following changes were made today: Start mexiletine  Labs/ tests ordered today include:  No orders of the defined types were placed in this encounter.    Disposition:   FU with Jemia Fata 3 months  Signed, Cesare Sumlin Meredith Leeds, MD  05/19/2018 10:45 AM     [facility] [address] [place] [address] [phone] (office) [phone] (fax)

## 2018-05-19 NOTE — Patient Instructions (Addendum)
Medication Instructions:  Your physician has recommended you make the following change in your medication:  1.  Start taking mexiletine 150 mg capsules- Take 2 tabs (300 mg) by mouth twice a day.  Labwork: None ordered.  Testing/Procedures: You will get a nurse visit in 7 days for an EKG.  Please schedule.  Follow-Up: Your physician wants you to follow-up in: 3 months with Dr. Curt Bears.      Any Other Special Instructions Will Be Listed Below (If Applicable).  If you need a refill on your cardiac medications before your next appointment, please call your pharmacy.   Mexiletine capsules What is this medicine? MEXILETINE (mex [place] e teen) is an antiarrhythmic agent. This medicine is used to treat irregular heart rhythm and can slow rapid heartbeats. It can help your heart to return to and maintain a normal rhythm. Because of the side effects caused by this medicine, it is usually used for heartbeat problems that may be life-threatening. This medicine may be used for other purposes; ask your health care provider or pharmacist if you have questions. COMMON BRAND NAME(S): Mexitil What should I tell my health care provider before I take this medicine? They need to know if you have any of these conditions: -liver disease -other heart problems -previous heart attack -an unusual or allergic reaction to mexiletine, other medicines, foods, dyes, or preservatives -pregnant or trying to get pregnant -breast-feeding How should I use this medicine? Take this medicine by mouth with a glass of water. Follow the directions on the prescription label. It is recommended that you take this medicine with food or an antacid. Take your doses at regular intervals. Do not take your medicine more often than directed. Do not stop taking except on the advice of your doctor or health care professional. Talk to your pediatrician regarding the use of this medicine in children. Special care may be needed. Overdosage:  If you think you have taken too much of this medicine contact a poison control center or emergency room at once. NOTE: This medicine is only for you. Do not share this medicine with others. What if I miss a dose? If you miss a dose, take it as soon as you can. If it is almost time for your next dose, take only that dose. Do not take double or extra doses. What may interact with this medicine? Do not take this medicine with any of the following medications: -dofetilide This medicine may also interact with the following medications: -caffeine -cimetidine -medicines for depression, anxiety, or psychotic disturbances -medicines to control heart rhythm -phenobarbital -phenytoin -rifampin -theophylline This list may not describe all possible interactions. Give your health care provider a list of all the medicines, herbs, non-prescription drugs, or dietary supplements you use. Also tell them if you smoke, drink alcohol, or use illegal drugs. Some items may interact with your medicine. What should I watch for while using this medicine? Your condition will be monitored closely when you first begin therapy. Often, this drug is first started in a hospital or other monitored health care setting. Once you are on maintenance therapy, visit your doctor or health care professional for regular checks on your progress. Because your condition and use of this medicine carry some risk, it is a good idea to carry an identification card, necklace or bracelet with details of your condition, medications, and doctor or health care professional. Dennis Bast may get drowsy or dizzy. Do not drive, use machinery, or do anything that needs mental alertness until you  know how this medicine affects you. Do not stand or sit up quickly, especially if you are an older patient. This reduces the risk of dizzy or fainting spells. Alcohol can make you more dizzy, increase flushing and rapid heartbeats. Avoid alcoholic drinks. What side effects  may I notice from receiving this medicine? Side effects that you should report to your doctor or health care professional as soon as possible: -allergic reactions like skin rash, itching or hives, swelling of the face, lips, or tongue -breathing problems -chest pain, continued irregular heartbeats -redness, blistering, peeling or loosening of the skin, including inside the mouth -seizures -skin rash -trembling, shaking -unusual bleeding or bruising -unusually weak or tired Side effects that usually do not require medical attention (report to your doctor or health care professional if they continue or are bothersome): -blurred vision -difficulty walking -heartburn -nausea, vomiting -nervousness -numbness, or tingling in the fingers or toes This list may not describe all possible side effects. Call your doctor for medical advice about side effects. You may report side effects to FDA at 1-800-FDA-1088. Where should I keep my medicine? Keep out of reach of children. Store at room temperature between 15 and 30 degrees C (59 and 86 degrees F). Throw away any unused medicine after the expiration date. NOTE: This sheet is a summary. It may not cover all possible information. If you have questions about this medicine, talk to your doctor, pharmacist, or health care provider.  2018 Elsevier/Gold Standard (2008-03-27 13:59:49)

## 2018-05-21 ENCOUNTER — Telehealth: Payer: Self-pay | Admitting: Cardiology

## 2018-05-21 NOTE — Telephone Encounter (Addendum)
Dr. Curt Bears - please advise   5:18pm-- called pt back to inform him it would be next week before Dr. Curt Bears will be able to review concerns.  Pt is going to take Mexiletine 150 mg BID instead of the 300 mg BID and see if he doesn't have the same SE (this is his suggestion and choice).  Pt aware I will follow up Tuesday to see how he feels on the lower dose and will then discuss w/ WC.  Pt is agreeable to this plan and will call if issues worsen over weekend/holiday.  They appreciate my call

## 2018-05-21 NOTE — Telephone Encounter (Signed)
New Message        Pt c/o medication issue:  1. Name of Medication: Mexiletine  2. How are you currently taking this medication (dosage and times per day)? 2 x a day  3. Are you having a reaction (difficulty breathing--STAT)?No   4. What is your medication issue? Patient has dizzines, ringing in ear, can't do daily walking, not sleeping well, decrease in appetite,irritated, memory worsen. Meds just not working

## 2018-05-26 ENCOUNTER — Ambulatory Visit (INDEPENDENT_AMBULATORY_CARE_PROVIDER_SITE_OTHER): Payer: PPO

## 2018-05-26 VITALS — BP 120/76 | HR 84 | Ht 69.0 in | Wt 196.8 lb

## 2018-05-26 DIAGNOSIS — I493 Ventricular premature depolarization: Secondary | ICD-10-CM | POA: Diagnosis not present

## 2018-05-26 DIAGNOSIS — I255 Ischemic cardiomyopathy: Secondary | ICD-10-CM | POA: Diagnosis not present

## 2018-05-26 DIAGNOSIS — I5042 Chronic combined systolic (congestive) and diastolic (congestive) heart failure: Secondary | ICD-10-CM | POA: Diagnosis not present

## 2018-05-26 NOTE — Telephone Encounter (Signed)
Spent 30 min speaking with pt and his wife.  Followed up w/ patient.  Spoke with both he and his wife. Pt reporting unwanted SE from Mexiletine.  States that he can't walk to the mailbox w/o feeling weak, had decreased appetite, dizziness, tinnitus, trouble sleeping, memory issues and is more irritable.  Wife also reports that he is sleeping a lot also.  Pt states that he just wants quality of life. He does not want to take any meds if it will continue to make him feel like this. Discussed w/ pt that SE he is reporting is most likely r/t to more that new medication.  Talked about his EF, HF and age. And that those could be causing some of the same issues. Pt aware I am going to speak with Dr. Curt Bears in detail about pt/family wishes. Pt also aware that I will speak with Dr. Camillia Herter nurse to get him an overdue appt w/ him.  Advised that he needs to also have a conversation w/ his general cardiologist about his expectations and what he wants for his "remaining days, however many those may be".  They are aware I will call them back after speaking w/ Dr Curt Bears.

## 2018-05-26 NOTE — Telephone Encounter (Signed)
Follow up    Patients wife is returning call in reference to medication. Please call to discuss.

## 2018-05-26 NOTE — Progress Notes (Signed)
1.) Reason for visit: EKG, start Mexiletine   2.) Name of MD requesting visit: Dr. Ileana Ladd  47.) H&P: 84 year old male, history of CAD, PVCs, HTN and systolic HF  4.) ROS related to problem: The EKG was confirmed by Dr. Burt Knack to be stable  5.) Assessment and plan per MD: Patient presented today with ringing in his ears, weakness in legs, trouble sleeping and irritability. He denied SOB and any other symptoms. Spoke with Dr. Rip Harbour), he reviewed the EKG and stated it was stable. He advised that Dr. Curt Bears review his symptoms and provide further changes and recommendations.

## 2018-05-27 ENCOUNTER — Encounter

## 2018-05-27 ENCOUNTER — Ambulatory Visit: Payer: PPO | Admitting: Cardiovascular Disease

## 2018-05-28 NOTE — Telephone Encounter (Signed)
Informed ok to stop Mexiletine, per Dr. Curt Bears. appt made to speak with Dr. Angelena Form next month and discuss goals of care and treatment plan. Advised to keep f/u w/ Camnitz later this year. Advised to call if pt has issues w/ PVCs after stopping medication.  Pt and wife are agreeable to plan. They appreciate my call.

## 2018-06-16 ENCOUNTER — Encounter: Payer: Self-pay | Admitting: Cardiovascular Disease

## 2018-06-16 ENCOUNTER — Ambulatory Visit (INDEPENDENT_AMBULATORY_CARE_PROVIDER_SITE_OTHER): Payer: PPO | Admitting: Cardiovascular Disease

## 2018-06-16 VITALS — BP 140/70 | HR 81 | Ht 69.0 in | Wt 194.8 lb

## 2018-06-16 DIAGNOSIS — I2510 Atherosclerotic heart disease of native coronary artery without angina pectoris: Secondary | ICD-10-CM

## 2018-06-16 DIAGNOSIS — I493 Ventricular premature depolarization: Secondary | ICD-10-CM | POA: Diagnosis not present

## 2018-06-16 DIAGNOSIS — E782 Mixed hyperlipidemia: Secondary | ICD-10-CM

## 2018-06-16 DIAGNOSIS — I10 Essential (primary) hypertension: Secondary | ICD-10-CM | POA: Diagnosis not present

## 2018-06-16 DIAGNOSIS — I255 Ischemic cardiomyopathy: Secondary | ICD-10-CM

## 2018-06-16 NOTE — Progress Notes (Signed)
Chief Complaint  Patient presents with  . Follow-up    CAD     History of Present Illness: 84 yo male with history of CAD, PVCs, HLD, HTN and CVA who is here today for follow up. He had been followed by Dr. Gwenlyn Found and switched to my office in 2018. I performed his cardiac cath in May 2017. His cardiac cath showed a severe mid LAD stenosis treated with a drug eluting stent and a severe stenosis in the obtuse marginal artery treated with a drug eluting stent. Echo May 2017 with LVEF=30-35% prior to his PCI. After the PCI, echo in August 2017 showed improvement in LVEF to 40-45%. Also noted to have mild mitral valve regurgitation. He is known to have PVCs. He has stopped his Plavix due to easy bruising. HIs Coreg was stopped due to bradycardia. He was seen in our office February 2019 by Lyda Jester, PA for PVCs. Cardiac monitor February 2019 with frequent PVCs. His Coreg was restarted. Echo showed reduced LVEF=35-40% felt to be tachy mediated. He had a stroke in May 2019. Echo May 2019 with LVEF=30-35%. Repeat limited echo with contrast 01/25/18 with LVEF estimated at 20-25% but no thrombus identified. TEE on 01/26/18 with LvEF=20-25% with trivial AI, mild to moderate MR. NO intracardiac thrombus noted. He was discharged home on Eliquis due to his low EF and potential to form LV thrombus. He was also discharged on amiodarone. He was seen in our EP clinic by Dr. Lennie Odor. Latest Echo 05/12/18 with LVEF=20-25%. He did not tolerate amiodarone due to bad dreams and this was stopped. Dr. Lennie Odor started mexiletine at the office visit 05/19/18 but he did not tolerate this due to weakness and fatigue. Plans for follow up in the EP clinic in November 2019.   He is here today for follow up. The patient denies any chest pain, dyspnea, palpitations, lower extremity edema, orthopnea, PND, dizziness, near syncope or syncope.   Primary Care Physician: [name], MD  Past Medical History:  Diagnosis Date  .  Angina decubitus ([place])   . BPH with elevated PSA 01/06/2017   Has seen urology in the past, declines any treatment  . Bradycardia 04/20/2017  . CAD (coronary artery disease)   . Cerebellar infarct ([place])   . Cerebral infarction involving cerebellar artery ([place]) 01/26/2018  . CHF (congestive heart failure) ([place])   . Coronary artery disease involving native coronary artery of native heart with unstable angina pectoris ([place])   . Frequent unifocal PVCs    Palpitations since early 20s, PVCs and pairs  . History of CVA (cerebrovascular accident) without residual deficits 12/20/2010   CVA Nov 2007   . Hypercholesteremia 01/17/2011   Currently taking red yeast rice extract   . Hyperlipidemia   . Hypertension   . Hypertension, benign 12/20/2010   hypertension   . Ischemic cardiomyopathy   . Palpitations 12/25/2015   palpitations   . Prediabetes   . PVC's (premature ventricular contractions) 01/23/2016  . Stroke [facility]) 2009  . Subclinical hypothyroidism 12/21/2015    Past Surgical History:  Procedure Laterality Date  . CARDIAC CATHETERIZATION N/A 01/23/2016   Procedure: Right/Left Heart Cath and Coronary Angiography;  Surgeon: Burnell Blanks, MD;  Location: [facility];  Service: Cardiovascular;  Laterality: N/A;  . CARDIAC CATHETERIZATION N/A 01/23/2016   Procedure: Coronary Stent Intervention;  Surgeon: Burnell Blanks, MD;  Location: [facility];  Service: Cardiovascular;  Laterality: N/A;  . HERNIA REPAIR  06/1992  .  TEE WITHOUT CARDIOVERSION N/A 01/26/2018   Procedure: TRANSESOPHAGEAL ECHOCARDIOGRAM (TEE);  Surgeon: Jolaine Artist, MD;  Location[facility];  Service: Cardiovascular;  Laterality: N/A;    Current Outpatient Medications  Medication Sig Dispense Refill  . Ascorbic Acid (VITAMIN C PO) Take 250 mg by mouth 3 (three) times a week.     Marland Kitchen aspirin 81 MG tablet Take 81 mg by mouth daily.     . carvedilol (COREG) 3.125 MG tablet Take 3.125 mg by mouth 2  (two) times daily with a meal.    . Cholecalciferol (VITAMIN D PO) Take 5,000 Units by mouth daily.    . Coenzyme Q10 (COQ10 PO) Take 50 mg by mouth.    Arne [place] 5 MG TABS tablet TAKE 1 TABLET TWICE A DAY 1801 tablet 1  . furosemide (LASIX) 20 MG tablet Take 20 mg by mouth as needed for fluid.    Marland Kitchen losartan (COZAAR) 25 MG tablet Take 0.5 tablets (12.5 mg total) by mouth daily. 90 tablet 3  . Magnesium 100 MG TABS Take 1 tablet by mouth 2 (two) times daily.    . Potassium 99 MG TABS Take 99 mg by mouth daily.     . tamsulosin (FLOMAX) 0.4 MG CAPS capsule Take 1 capsule (0.4 mg total) by mouth daily. 30 capsule 3  . vitamin E 400 UNIT capsule Take 400 Units by mouth 3 (three) times a week.     No current facility-administered medications for this visit.     No Known Allergies  Social History   Socioeconomic History  . Marital status: Married    Spouse name: Not on file  . Number of children: Not on file  . Years of education: Not on file  . Highest education level: Not on file  Occupational History  . Not on file  Social Needs  . Financial resource strain: Not on file  . Food insecurity:    Worry: Not on file    Inability: Not on file  . Transportation needs:    Medical: Not on file    Non-medical: Not on file  Tobacco Use  . Smoking status: Never Smoker  . Smokeless tobacco: Never Used  Substance and Sexual Activity  . Alcohol use: No  . Drug use: No  . Sexual activity: Not Currently  Lifestyle  . Physical activity:    Days per week: Not on file    Minutes per session: Not on file  . Stress: Not on file  Relationships  . Social connections:    Talks on phone: Not on file    Gets together: Not on file    Attends religious service: Not on file    Active member of club or organization: Not on file    Attends meetings of clubs or organizations: Not on file    Relationship status: Not on file  . Intimate partner violence:    Fear of current or ex partner: Not on file     Emotionally abused: Not on file    Physically abused: Not on file    Forced sexual activity: Not on file  Other Topics Concern  . Not on file  Social History Narrative  . Not on file    Family History  Problem Relation Age of Onset  . Stroke Mother   . Early death Mother   . Cancer Brother     Review of Systems:  As stated in the HPI and otherwise negative.   BP 140/70  Pulse 81   Ht 5\' 9"  (1.753 m)   Wt 194 lb 12.8 oz (88.4 kg)   SpO2 96%   BMI 28.77 kg/m   Physical Examination: General: Well developed, well nourished, NAD  HEENT: OP clear, mucus membranes moist  SKIN: warm, dry. No rashes. Neuro: No focal deficits  Musculoskeletal: Muscle strength 5/5 all ext  Psychiatric: Mood and affect normal  Neck: No JVD, no carotid bruits, no thyromegaly, no lymphadenopathy.  Lungs:Clear bilaterally, no wheezes, rhonci, crackles Cardiovascular: Regular rate and rhythm. No murmurs, gallops or rubs. Abdomen:Soft. Bowel sounds present. Non-tender.  Extremities: No lower extremity edema. Pulses are 2 + in the bilateral DP/PT.  Echo 05/12/18: Left ventricle: The cavity size was mildly dilated. Wall   thickness was increased in a pattern of mild LVH. Systolic   function was severely reduced. The estimated ejection fraction   was in the range of 20% to 25%. Diffuse hypokinesis. Akinesis of   the apical myocardium. Features are consistent with a   pseudonormal left ventricular filling pattern, with concomitant   abnormal relaxation and increased filling pressure (grade 2   diastolic dysfunction). Doppler parameters are consistent with   high ventricular filling pressure. - Aortic valve: Transvalvular velocity was within the normal range.   There was no stenosis. There was mild regurgitation. - Mitral valve: Transvalvular velocity was within the normal range.   There was no evidence for stenosis. There was mild regurgitation. - Left atrium: The atrium was severely dilated. -  Right ventricle: The cavity size was normal. Wall thickness was   normal. Systolic function was normal. - Tricuspid valve: There was mild-moderate regurgitation. - Pulmonary arteries: Systolic pressure was severely increased. PA   peak pressure: 60 mm Hg (S).  EKG:  EKG is not  ordered today. The ekg ordered today demonstrates   Recent Labs: 11/09/2017: TSH 3.98 01/21/2018: B Natriuretic Peptide 2,431.3; Magnesium 1.9 01/27/2018: ALT 40; Hemoglobin 13.4; Platelets 125 02/12/2018: BUN 18; Creat 1.10; Potassium 4.7; Sodium 139   Lipid Panel    Component Value Date/Time   CHOL 151 01/22/2018 0720   TRIG 64 01/22/2018 0720   HDL 41 01/22/2018 0720   CHOLHDL 3.7 01/22/2018 0720   VLDL 13 01/22/2018 0720   LDLCALC 97 01/22/2018 0720   LDLCALC 114 (H) 11/09/2017 0841     Wt Readings from Last 3 Encounters:  06/16/18 194 lb 12.8 oz (88.4 kg)  05/26/18 196 lb 12.8 oz (89.3 kg)  05/19/18 195 lb (88.5 kg)     Other studies Reviewed: Additional studies/ records that were reviewed today include: . Review of the above records demonstrates:   Assessment and Plan:   1. CAD without angina: NO chest pain. Continue ASA, Coreg. He does not tolerate statins. He does not wish to restart.     2. HTN: BP is controlled. No changes  3. Hyperlipidemia: He does not tolerate statins. He does not wish to restart.   4. Ischemic cardiomyopathy/Chronic systolic CHF: Weight is stable. Volume status is ok. He has not tolerated high doses of beta blockers due to bradycardia. He is on Coreg 3.125 mg BID now. He is on an ARB. Will continue Lasix. His LVEF is unlikely to benefit from a BiVICD as his QRS is narrow. Can discuss ICD with DR. Caminitz at f/u. Having a backup pacemaker would allow titration of his beta blocker which may help with his PVCs also. I don't know if Mr. Mccathern would even agree to an ICD but we can  at least have that discussion.   5. PVCs: He has been seen in the EP clinic. He did not  tolerate amiodarone or mexilitine. He is taking Coreg now and tolerating. He has no awareness of his PVCs at this time.   Current medicines are reviewed at length with the patient today.  The patient does not have concerns regarding medicines.  The following changes have been made:  no change  Labs/ tests ordered today include:   No orders of the defined types were placed in this encounter.    Disposition:   FU with me in 6  months   Signed, Lauree Chandler, MD 06/16/2018 11:48 AM    [address], [place], [place]  [address] Phone: [phone]; Fax: [phone]

## 2018-06-16 NOTE — Patient Instructions (Signed)
Medication Instructions:  Your physician recommends that you continue on your current medications as directed. Please refer to the Current Medication list given to you today.   Labwork: none  Testing/Procedures: none  Follow-Up: Your physician recommends that you schedule a follow-up appointment in: 6 months. Please call our office in about 3 months to schedule this appointment  Follow up with Dr. Curt Bears as planned    Any Other Special Instructions Will Be Listed Below (If Applicable).     If you need a refill on your cardiac medications before your next appointment, please call your pharmacy.

## 2018-06-25 IMAGING — DX DG CHEST 1V PORT
1 series · 1 of 1 positions shown · non-contrast
Comparison: 12/07/2017

CLINICAL DATA: Dyspnea and hypoxia.

EXAM:
PORTABLE CHEST 1 VIEW

[chest ap]
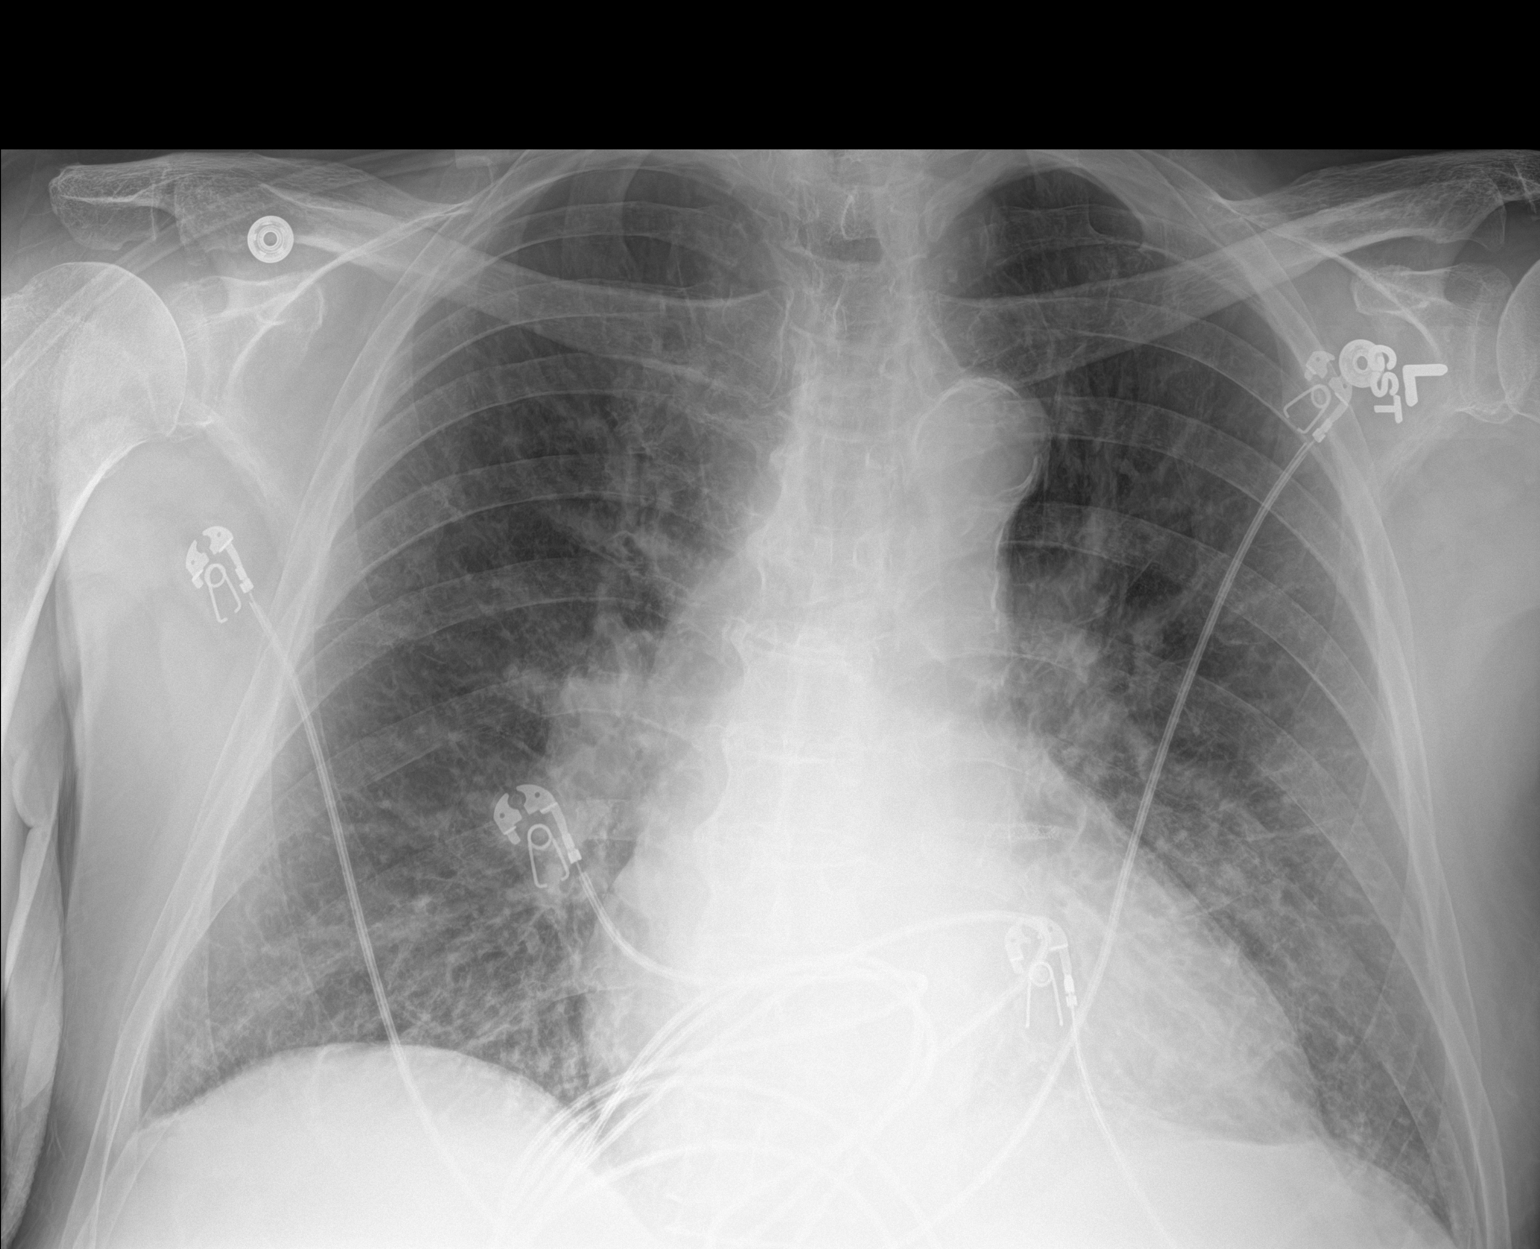

[1 of 1 positions shown; findings below may reference images not displayed]

FINDINGS: Cardiomegaly with aortic atherosclerosis. Mild central vascular
congestion superimposed on chronic interstitial lung disease. No
effusion or pulmonary consolidation. No pneumothorax. Degenerative
changes are seen about both shoulders and dorsal spine.
IMPRESSION: Cardiomegaly with aortic atherosclerosis.  Mild CHF.

## 2018-06-25 IMAGING — MR MR HEAD W/O CM
10 of 11 series · 42 of 48 positions shown · non-contrast
Comparison: None.

CLINICAL DATA: Persistent central vertigo.  Near syncope.

EXAM:
MRI HEAD WITHOUT CONTRAST
TECHNIQUE: Multiplanar, multiecho pulse sequences of the brain and surrounding
structures were obtained without intravenous contrast.

[Series 5001: ax dwi_tracew · axial · 3.0mm · 1.50mm/px · z∈[-5,+133]mm · 8 of 80 slices shown]
[im 1/80]
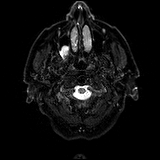
[im 12/80]
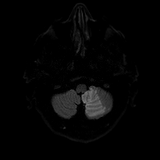
[im 23/80]
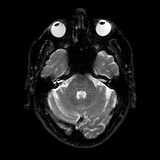
[im 34/80]
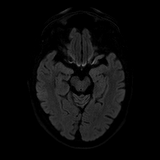
[im 46/80]
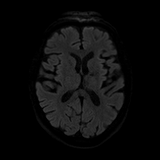
[im 57/80]
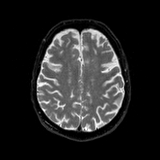
[im 68/80]
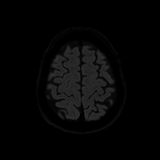
[im 80/80]
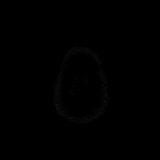

[Series 6001: ax dwi_adc · axial · 3.0mm · 1.50mm/px · z∈[-5,+133]mm · 4 of 40 slices shown]
[im 1/40]
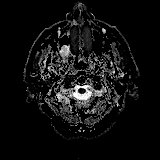
[im 14/40]
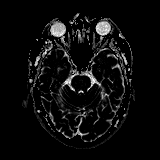
[im 27/40]
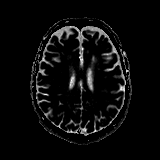
[im 40/40]
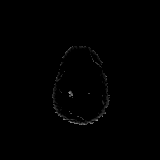

[Series 7001: cor dwi_tracew · coronal · 5.0mm · 1.44mm/px · 7 of 68 slices shown]
[im 1/68]
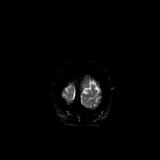
[im 12/68]
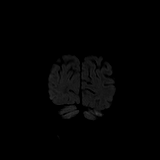
[im 23/68]
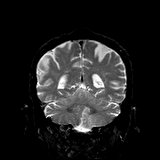
[im 34/68]
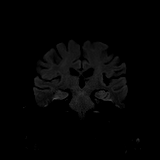
[im 45/68]
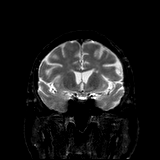
[im 56/68]
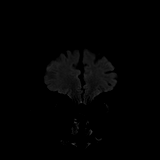
[im 68/68]
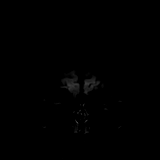

[Series 8001: cor dwi_adc · coronal · 5.0mm · 1.44mm/px · 3 of 34 slices shown]
[im 1/34]
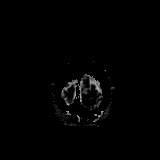
[im 17/34]
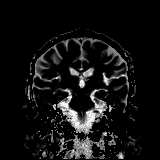
[im 34/34]
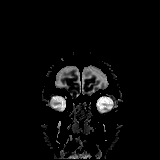

[Series 9001: T1 · sagittal · 5.0mm · 0.75mm/px · 2 of 24 slices shown]
[im 1/24]
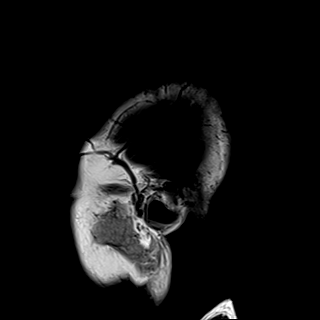
[im 24/24]
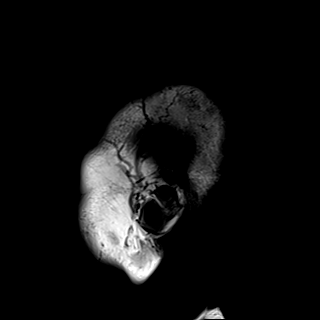

[T2 · axial · 5.0mm · 0.69mm/px · z∈[-10,+131]mm · 2 of 25 slices shown (1 of 2)]
[im 1/25]
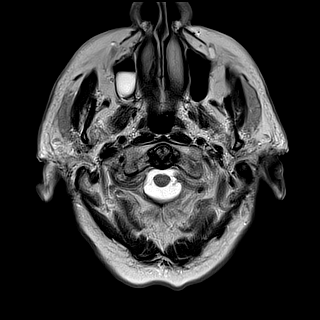
[im 25/25]
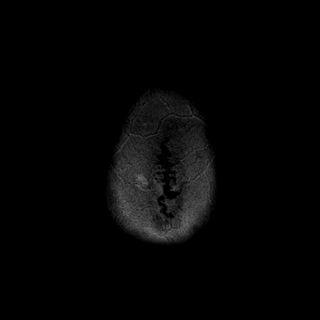

[FLAIR · axial · 5.0mm · 0.43mm/px · z∈[-9,+132]mm · 2 of 25 slices shown]
[im 1/25]
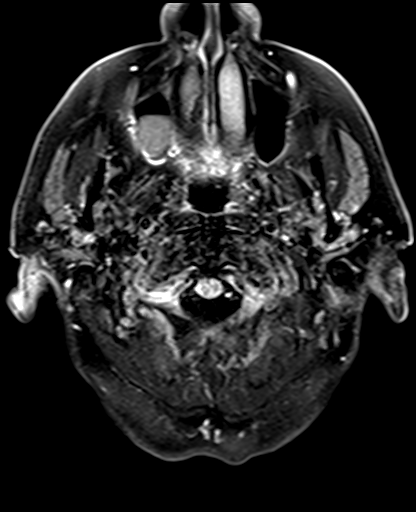
[im 25/25]
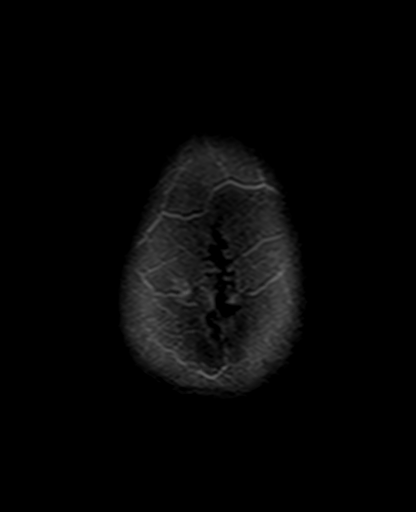

[swi_images · axial · 3.0mm · 0.86mm/px · z∈[-19,+155]mm · 6 of 60 slices shown]
[im 1/60]
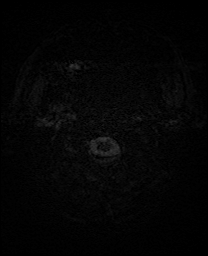
[im 12/60]
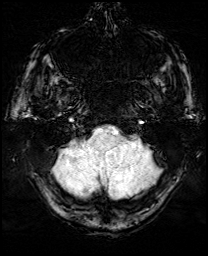
[im 24/60]
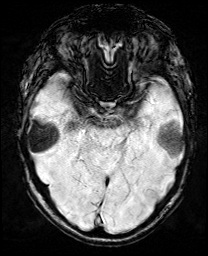
[im 36/60]
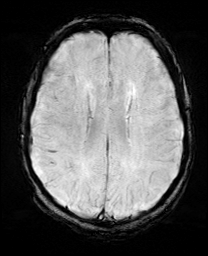
[im 48/60]
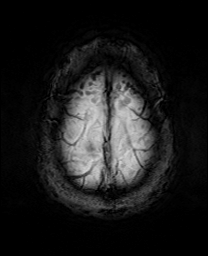
[im 60/60]
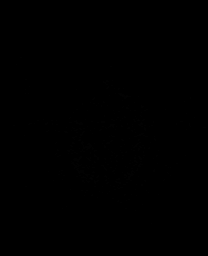

[mip_images(sw) · axial · 24.0mm · 0.86mm/px · z∈[-8,+145]mm · 5 of 53 slices shown]
[im 1/53]
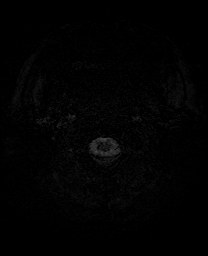
[im 14/53]
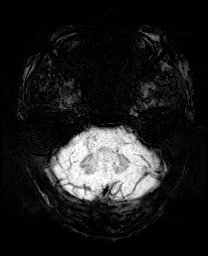
[im 27/53]
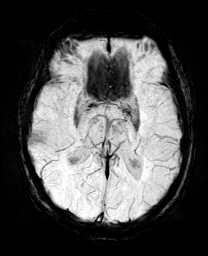
[im 40/53]
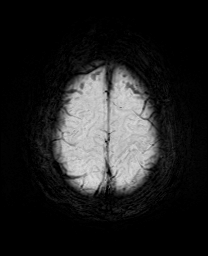
[im 53/53]
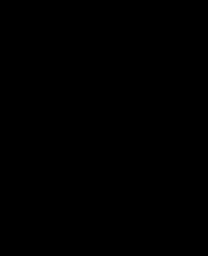

[T2 · coronal · 5.0mm · 0.34mm/px · 3 of 29 slices shown (2 of 2)]
[im 1/29]
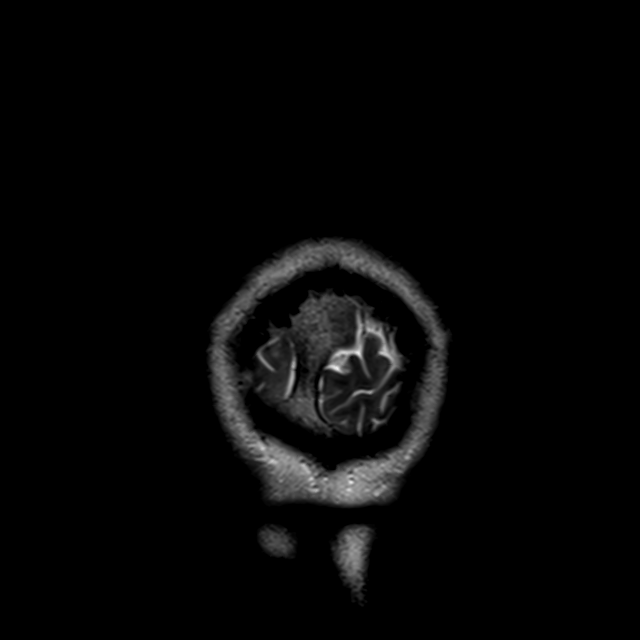
[im 15/29]
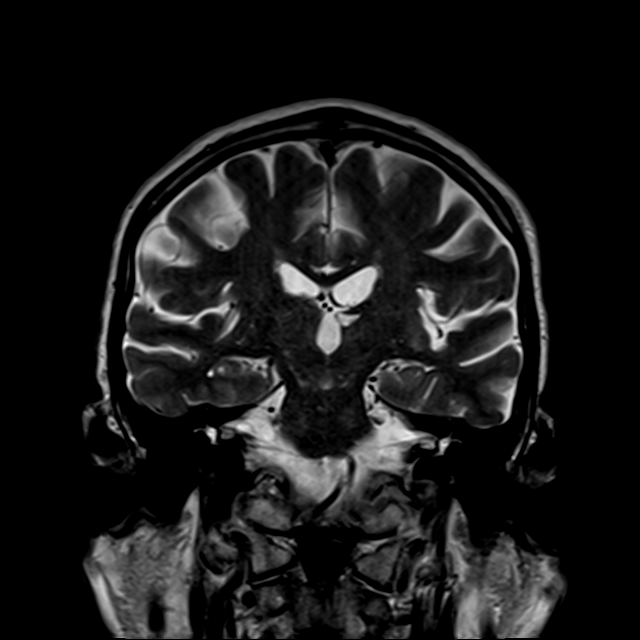
[im 29/29]
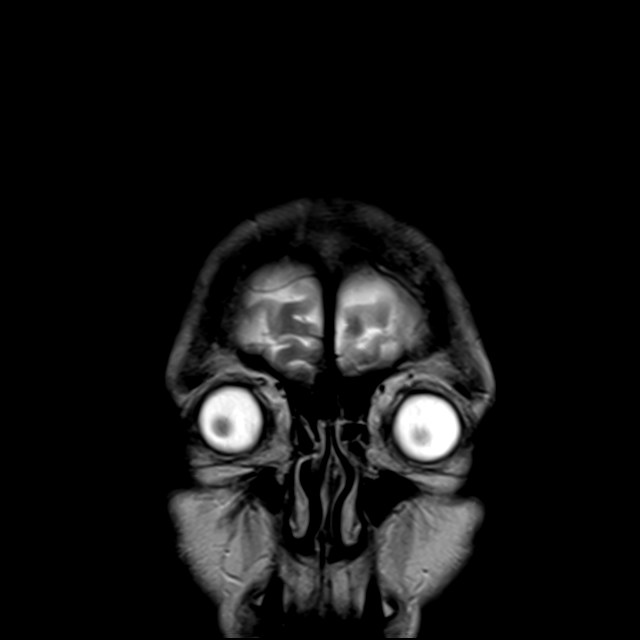

[42 of 48 positions shown; findings below may reference images not displayed]

FINDINGS: BRAIN: The midline structures are normal. Abnormal diffusion
restriction within the inferior left cerebellar hemisphere, in the
distribution of the posterior inferior cerebellar artery. No mass
effect or acute hemorrhage. Fourth ventricle remains widely patent.
Old left basal ganglia and corona radiata lacunar infarcts. Mild
periventricular white matter hyperintensity. No age-advanced or
lobar predominant atrophy. There is blooming on susceptibility
weighted imaging in the area of the left posterior inferior
cerebellar artery, which may indicate thrombus.

VASCULAR: Major intracranial arterial and venous sinus flow voids
are preserved.

SKULL AND UPPER CERVICAL SPINE: The visualized skull base,
calvarium, upper cervical spine and extracranial soft tissues are
normal.

SINUSES/ORBITS: Small maxillary retention cysts. No mastoid or
middle ear effusion. Normal orbits.
IMPRESSION: 1. Acute left posterior inferior cerebellar artery territory infarct
without hemorrhage or mass effect
2. Chronic small vessel disease with old left basal ganglia and
corona radiata lacunar infarcts.

## 2018-07-05 ENCOUNTER — Ambulatory Visit: Payer: PPO | Admitting: Adult Health

## 2018-07-14 ENCOUNTER — Ambulatory Visit: Payer: PPO | Admitting: Cardiovascular Disease

## 2018-08-24 ENCOUNTER — Ambulatory Visit: Payer: PPO | Admitting: Cardiology

## 2018-09-29 ENCOUNTER — Ambulatory Visit (INDEPENDENT_AMBULATORY_CARE_PROVIDER_SITE_OTHER): Payer: PPO | Admitting: Family Medicine

## 2018-09-29 ENCOUNTER — Encounter: Payer: Self-pay | Admitting: Family Medicine

## 2018-09-29 ENCOUNTER — Other Ambulatory Visit: Payer: Self-pay

## 2018-09-29 VITALS — BP 128/78 | HR 82 | Temp 98.1°F | Resp 16 | Ht 69.0 in | Wt 200.0 lb

## 2018-09-29 DIAGNOSIS — E7800 Pure hypercholesterolemia, unspecified: Secondary | ICD-10-CM | POA: Diagnosis not present

## 2018-09-29 DIAGNOSIS — R7303 Prediabetes: Secondary | ICD-10-CM

## 2018-09-29 DIAGNOSIS — Z8673 Personal history of transient ischemic attack (TIA), and cerebral infarction without residual deficits: Secondary | ICD-10-CM | POA: Diagnosis not present

## 2018-09-29 DIAGNOSIS — I10 Essential (primary) hypertension: Secondary | ICD-10-CM

## 2018-09-29 DIAGNOSIS — I5022 Chronic systolic (congestive) heart failure: Secondary | ICD-10-CM | POA: Diagnosis not present

## 2018-09-29 MED ORDER — TAMSULOSIN HCL 0.4 MG PO CAPS: 0.4000 mg | ORAL_CAPSULE | Freq: Every day | ORAL | 3 refills | Status: DC

## 2018-09-29 NOTE — Assessment & Plan Note (Signed)
Some personality change, but pt declines therapy /intervention ON ASA BP controlled Check A1C as well

## 2018-09-29 NOTE — Patient Instructions (Signed)
F/U August for Physical   

## 2018-09-29 NOTE — Progress Notes (Signed)
   Subjective:    Patient ID: Patrick Maxwell, male    DOB: [birth year], 85 y.o.   MRN: [id number]  Patient presents for Follow-up (is fasting)  Patient here to follow-up chronic medical problems his last visit was in August 2019 see previous note he has been reluctant to take many of his medications even that is recommended by myself and cardiology.  He has congestive heart failure, retention history of stroke Congestive heart failure his weights at home have been-currently taking losartan, is also on Lasix 20 mg as needed and carvedilol twice a day. He has taken 1 dose of lasix since our last visit, he has gained weight eating a lot of snacks and sugar over the holiday, but has not had CP, SOB   CVA-only on Eliquis, he declines being on statin drug, he does have some irritability which his wife states is been present since he had a stroke he does admit that he sometimes gets frustrated easily and has some memory troubles but does not want anything done about this.   BPH he is taking Flomax, he has elevated PSA but he declines any treatment for this Prediabetes his A1c was 5.7% in May due for recheck Last cardiology visit was in September  Review Of Systems:  GEN- denies fatigue, fever, weight loss,weakness, recent illness HEENT- denies eye drainage, change in vision, nasal discharge, CVS- denies chest pain, palpitations RESP- denies SOB, cough, wheeze ABD- denies N/V, change in stools, abd pain GU- denies dysuria, hematuria, dribbling, incontinence MSK- denies joint pain, muscle aches, injury Neuro- denies headache, dizziness, syncope, seizure activity       Objective:    BP 128/78   Pulse 82   Temp 98.1 F (36.7 C) (Oral)   Resp 16   Ht 5\' 9"  (1.753 m)   Wt 200 lb (90.7 kg)   SpO2 99%   BMI 29.53 kg/m  GEN- NAD, alert and oriented x3 HEENT- PERRL, EOMI, non injected sclera, pink conjunctiva, MMM, oropharynx clear Neck- Supple, no thyromegaly CVS- RRR, PVC no  murmur RESP-CTAB ABD-NABS,soft,NT,ND Psych- normal affect and mood  EXT- No edema Pulses- Radial 2+        Assessment & Plan:      Problem List Items Addressed This Visit      Unprioritized   CHF (congestive heart failure) (HCC)    Weight gain overall but NOT fluid overloaded, no current symptoms Continue coreg, losartan, prn lasix      History of CVA (cerebrovascular accident) without residual deficits    Some personality change, but pt declines therapy /intervention ON ASA BP controlled Check A1C as well      Relevant Orders   Lipid panel   Hypercholesteremia   Relevant Orders   Lipid panel   Hypertension, benign - Primary    Controlled no changes to meds      Relevant Orders   CBC with Differential/Platelet   Comprehensive metabolic panel   Lipid panel   Prediabetes   Relevant Orders   Hemoglobin A1c      Note: This dictation was prepared with Dragon dictation along with smaller phrase technology. Any transcriptional errors that result from this process are unintentional.

## 2018-09-29 NOTE — Assessment & Plan Note (Signed)
Controlled no changes to meds 

## 2018-09-29 NOTE — Assessment & Plan Note (Signed)
Weight gain overall but NOT fluid overloaded, no current symptoms Continue coreg, losartan, prn lasix

## 2018-09-29 NOTE — Addendum Note (Signed)
Addended by: Vic Blackbird F on: 09/29/2018 05:03 PM   Modules accepted: Orders

## 2018-09-30 ENCOUNTER — Encounter: Payer: Self-pay | Admitting: *Deleted

## 2018-09-30 LAB — CBC WITH DIFFERENTIAL/PLATELET
Absolute Monocytes: 475 cells/uL (ref 200–950)
Basophils Absolute: 38 cells/uL (ref 0–200)
Basophils Relative: 0.7 %
Eosinophils Absolute: 140 cells/uL (ref 15–500)
Eosinophils Relative: 2.6 %
HCT: 43.9 % (ref 38.5–50.0)
Hemoglobin: 14.8 g/dL (ref 13.2–17.1)
Lymphs Abs: 1291 cells/uL (ref 850–3900)
MCH: 31.5 pg (ref 27.0–33.0)
MCHC: 33.7 g/dL (ref 32.0–36.0)
MCV: 93.4 fL (ref 80.0–100.0)
MPV: 12 fL (ref 7.5–12.5)
Monocytes Relative: 8.8 %
Neutro Abs: 3456 cells/uL (ref 1500–7800)
Neutrophils Relative %: 64 %
Platelets: 157 10*3/uL (ref 140–400)
RBC: 4.7 10*6/uL (ref 4.20–5.80)
RDW: 12.9 % (ref 11.0–15.0)
Total Lymphocyte: 23.9 %
WBC: 5.4 10*3/uL (ref 3.8–10.8)

## 2018-09-30 LAB — COMPREHENSIVE METABOLIC PANEL
AG Ratio: 2 (calc) (ref 1.0–2.5)
ALT: 16 U/L (ref 9–46)
AST: 18 U/L (ref 10–35)
Albumin: 4.3 g/dL (ref 3.6–5.1)
Alkaline phosphatase (APISO): 62 U/L (ref 40–115)
BUN/Creatinine Ratio: 15 (calc) (ref 6–22)
BUN: 17 mg/dL (ref 7–25)
CO2: 28 mmol/L (ref 20–32)
Calcium: 8.9 mg/dL (ref 8.6–10.3)
Chloride: 105 mmol/L (ref 98–110)
Creat: 1.14 mg/dL — ABNORMAL HIGH (ref 0.70–1.11)
Globulin: 2.2 g/dL (calc) (ref 1.9–3.7)
Glucose, Bld: 102 mg/dL — ABNORMAL HIGH (ref 65–99)
Potassium: 4.4 mmol/L (ref 3.5–5.3)
Sodium: 140 mmol/L (ref 135–146)
Total Bilirubin: 0.8 mg/dL (ref 0.2–1.2)
Total Protein: 6.5 g/dL (ref 6.1–8.1)

## 2018-09-30 LAB — LIPID PANEL
CHOLESTEROL: 213 mg/dL — AB (ref <200)
HDL: 53 mg/dL (ref >40)
LDL Cholesterol (Calc): 138 mg/dL (calc) — ABNORMAL HIGH
Non-HDL Cholesterol (Calc): 160 mg/dL (calc) — ABNORMAL HIGH (ref <130)
Total CHOL/HDL Ratio: 4 (calc) (ref <5.0)
Triglycerides: 106 mg/dL (ref <150)

## 2018-09-30 LAB — HEMOGLOBIN A1C
Hgb A1c MFr Bld: 5.3 % of total Hgb (ref <5.7)
Mean Plasma Glucose: 105 (calc)
eAG (mmol/L): 5.8 (calc)

## 2019-01-25 ENCOUNTER — Telehealth: Payer: Self-pay | Admitting: Cardiovascular Disease

## 2019-01-25 NOTE — Telephone Encounter (Signed)
° °  01/25/2019 - Spoke to patient's wife.       VIDEO/Doximity visit on 01/31/2019    Consent sent via MyChart     -  01/25/2019

## 2019-01-31 ENCOUNTER — Other Ambulatory Visit: Payer: Self-pay

## 2019-01-31 ENCOUNTER — Telehealth (INDEPENDENT_AMBULATORY_CARE_PROVIDER_SITE_OTHER): Payer: PPO | Admitting: Cardiovascular Disease

## 2019-01-31 ENCOUNTER — Encounter: Payer: Self-pay | Admitting: Cardiovascular Disease

## 2019-01-31 VITALS — BP 139/81 | HR 69 | Ht 69.0 in | Wt 196.0 lb

## 2019-01-31 DIAGNOSIS — I10 Essential (primary) hypertension: Secondary | ICD-10-CM

## 2019-01-31 DIAGNOSIS — I255 Ischemic cardiomyopathy: Secondary | ICD-10-CM | POA: Diagnosis not present

## 2019-01-31 DIAGNOSIS — E782 Mixed hyperlipidemia: Secondary | ICD-10-CM | POA: Diagnosis not present

## 2019-01-31 DIAGNOSIS — I493 Ventricular premature depolarization: Secondary | ICD-10-CM

## 2019-01-31 DIAGNOSIS — I2510 Atherosclerotic heart disease of native coronary artery without angina pectoris: Secondary | ICD-10-CM | POA: Diagnosis not present

## 2019-01-31 DIAGNOSIS — I5042 Chronic combined systolic (congestive) and diastolic (congestive) heart failure: Secondary | ICD-10-CM

## 2019-01-31 NOTE — Patient Instructions (Signed)

## 2019-01-31 NOTE — Progress Notes (Signed)
Virtual Visit via Video Note   This visit type was conducted due to national recommendations for restrictions regarding the COVID-19 Pandemic (e.g. social distancing) in an effort to limit this patient's exposure and mitigate transmission in our community.  Due to his co-morbid illnesses, this patient is at least at moderate risk for complications without adequate follow up.  This format is felt to be most appropriate for this patient at this time.  All issues noted in this document were discussed and addressed.  A limited physical exam was performed with this format.  Please refer to the patient's chart for his consent to telehealth for [facility].   Date:  01/31/2019   ID:  Patrick Maxwell, DOB December 17, 1930, MRN [id number]  Patient Location: Home Provider Location: Office  PCP:  [name], MD  Cardiologist:  Lauree Chandler, MD  Electrophysiologist:  Constance Haw, MD   Evaluation Performed:  Follow-Up Visit  Chief Complaint:  Follow up- Follow up-CAD  History of Present Illness:    Patrick Maxwell is a 85 y.o. male with history of CAD, PVCs, HLD, HTN and CVA who is being seen today by virtual e-visit due to the Covid19 pandemic. He had been followed by Dr. Gwenlyn Found and switched to my office in 2018. I performed his cardiac cath in May 2017. His cardiac cath showed a severe mid LAD stenosis treated with a drug eluting stent and a severe stenosis in the obtuse marginal artery treated with a drug eluting stent. Echo May 2017 with LVEF=30-35% prior to his PCI. After the PCI, echo in August 2017 showed improvement in LVEF to 40-45%. Also noted to have mild mitral valve regurgitation. He is known to have PVCs. He has stopped his Plavix due to easy bruising. HIs Coreg was stopped due to bradycardia. He was seen in our office February 2019 by Lyda Jester, PA for PVCs. Cardiac monitor February 2019 with frequent PVCs. His Coreg was restarted. Echo showed reduced  LVEF=35-40% felt to be tachy mediated. He had a stroke in May 2019. Echo May 2019 with LVEF=30-35%. Repeat limited echo with contrast 01/25/18 with LVEF estimated at 20-25% but no thrombus identified. TEE on 01/26/18 with LvEF=20-25% with trivial AI, mild to moderate MR. No intracardiac thrombus noted. He was discharged home on Eliquis due to his low EF and potential to form LV thrombus. He was also discharged on amiodarone. He was seen in our EP clinic by Dr. Lennie Odor. Latest Echo 05/12/18 with LVEF=20-25%. He did not tolerate amiodarone due to bad dreams and this was stopped. Dr. Lennie Odor started mexiletine at the office visit 05/19/18 but he did not tolerate this due to weakness and fatigue. He is due to for follow up with Dr. Lennie Odor.   The patient denies chest pain, dyspnea, palpitations, dizziness, near syncope or syncope. No lower extremity edema.   The patient does not have symptoms concerning for COVID-19 infection (fever, chills, cough, or new shortness of breath).    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   CHF (congestive heart failure) ([place])    Coronary artery disease involving native coronary artery of native heart with unstable angina pectoris (HCC)    Frequent unifocal  PVCs    Palpitations since early 20s, PVCs and pairs   History of CVA (cerebrovascular accident) without residual deficits 12/20/2010   CVA Nov 2007    Hypercholesteremia 01/17/2011   Currently taking red yeast rice extract    Hyperlipidemia    Hypertension    Hypertension, benign 12/20/2010   hypertension    Ischemic cardiomyopathy    Palpitations 12/25/2015   palpitations    Prediabetes    PVC's (premature ventricular contractions)  01/23/2016   Stroke ([place]) 2009   Subclinical hypothyroidism 12/21/2015   Past Surgical History:  Procedure Laterality Date   CARDIAC CATHETERIZATION N/A 01/23/2016   Procedure: Right/Left Heart Cath and Coronary Angiography;  Surgeon: Burnell Blanks, MD;  Location: [place] CV LAB;  Service: Cardiovascular;  Laterality: N/A;   CARDIAC CATHETERIZATION N/A 01/23/2016   Procedure: Coronary Stent Intervention;  Surgeon: Burnell Blanks, MD;  Location: [place] CV LAB;  Service: Cardiovascular;  Laterality: N/A;   HERNIA REPAIR  06/1992   TEE WITHOUT CARDIOVERSION N/A 01/26/2018   Procedure: TRANSESOPHAGEAL ECHOCARDIOGRAM (TEE);  Surgeon: Jolaine Artist, MD;  Location: [facility] ENDOSCOPY;  Service: Cardiovascular;  Laterality: N/A;     Current Meds  Medication Sig   apixaban (ELIQUIS) 5 MG TABS tablet Take 5 mg by mouth daily.   Ascorbic Acid (VITAMIN C PO) Take 250 mg by mouth daily.    aspirin 81 MG tablet Take 81 mg by mouth daily.    carvedilol (COREG) 3.125 MG tablet Take 3.125 mg by mouth 2 (two) times daily with a meal.   Cholecalciferol (VITAMIN D PO) Take 2,000 Units by mouth 2 (two) times a week.    Coenzyme Q10 (COQ10 PO) Take 50 mg by mouth.   furosemide (LASIX) 20 MG tablet Take 20 mg by mouth as needed for fluid.   losartan (COZAAR) 25 MG tablet Take 0.5 tablets (12.5 mg total) by mouth daily.   Magnesium 125 MG CAPS Take 1 tablet by mouth daily.    Multiple Vitamin (MULTIVITAMIN) tablet Take 1 tablet by mouth daily.   Potassium 99 MG TABS Take 99 mg by mouth daily.    tamsulosin (FLOMAX) 0.4 MG CAPS capsule Take 1 capsule (0.4 mg total) by mouth daily.     Allergies:   Patient has no known allergies.   Social History   Tobacco Use   Smoking status: Never Smoker   Smokeless tobacco: Never Used  Substance Use Topics   Alcohol use: No   Drug use: No     Family Hx: The patient's family history includes Cancer in his brother; Early  death in his mother; Stroke in his mother.  ROS:   Please see the history of present illness.    All other systems reviewed and are negative.   Prior CV studies:   The following studies were reviewed today:  Echo 05/12/18: Left ventricle: The cavity size was mildly dilated. Wall thickness was increased in a pattern of mild LVH. Systolic function was severely reduced. The estimated ejection fraction was in the range of 20% to 25%. Diffuse hypokinesis. Akinesis of the apical myocardium. Features are consistent with a pseudonormal left ventricular filling pattern, with concomitant abnormal relaxation and increased filling pressure (grade 2 diastolic dysfunction). Doppler parameters are consistent with high ventricular filling pressure. - Aortic valve: Transvalvular velocity was within the normal range. There was no stenosis. There was mild regurgitation. - Mitral valve: Transvalvular velocity was within the normal range. There was no evidence for stenosis. There  was mild regurgitation. - Left atrium: The atrium was severely dilated. - Right ventricle: The cavity size was normal. Wall thickness was normal. Systolic function was normal. - Tricuspid valve: There was mild-moderate regurgitation. - Pulmonary arteries: Systolic pressure was severely increased. PA peak pressure: 60 mm Hg (S).  Labs/Other Tests and Data Reviewed:    EKG:  No ECG reviewed.  Recent Labs: 09/29/2018: ALT 16; BUN 17; Creat 1.14; Hemoglobin 14.8; Platelets 157; Potassium 4.4; Sodium 140   Recent Lipid Panel Lab Results  Component Value Date/Time   CHOL 213 (H) 09/29/2018 08:31 AM   TRIG 106 09/29/2018 08:31 AM   HDL 53 09/29/2018 08:31 AM   CHOLHDL 4.0 09/29/2018 08:31 AM   LDLCALC 138 (H) 09/29/2018 08:31 AM    Wt Readings from Last 3 Encounters:  01/31/19 196 lb (88.9 kg)  09/29/18 200 lb (90.7 kg)  06/16/18 194 lb 12.8 oz (88.4 kg)     Objective:    Vital Signs:  BP  139/81    Pulse 69    Ht 5\' 9"  (1.753 m)    Wt 196 lb (88.9 kg)    BMI 28.94 kg/m    VITAL SIGNS:  reviewed GEN:  no acute distress  ASSESSMENT & PLAN:    1. CAD without angina: He has no chest pain. Will continue Coreg and ASA. He does not tolerate statins and does not wish to try a lower dose statin.      2. HTN: BP is well controlled.   3. Hyperlipidemia: He does not tolerate statins. He does not wish to restart.   4. Ischemic cardiomyopathy/Chronic systolic CHF: No lower extremity edema. Weight is stable. Will continue beta blocker and ARB. He has not tolerated high doses of beta blockers due to bradycardia. His LVEF is unlikely to benefit from a BiVICD as his QRS is narrow. Can discuss ICD with DR. Caminitz at f/u. Given advanced age he may not be a candidate for an ICD. Continue Lasix.   5. PVCs: He has been seen in the EP clinic. He did not tolerate amiodarone or mexilitine. He is taking Coreg now and tolerating. He has no palpitations.   COVID-19 Education: The signs and symptoms of COVID-19 were discussed with the patient and how to seek care for testing (follow up with PCP or arrange E-visit).  The importance of social distancing was discussed today.  Time:   Today, I have spent 15 minutes with the patient with telehealth technology discussing the above problems.     Medication Adjustments/Labs and Tests Ordered: Current medicines are reviewed at length with the patient today.  Concerns regarding medicines are outlined above.   Tests Ordered: No orders of the defined types were placed in this encounter.   Medication Changes: No orders of the defined types were placed in this encounter.   Disposition:  Follow up in 6 month(s)  Signed, Lauree Chandler, MD  01/31/2019 9:15 AM    [place]

## 2019-04-08 ENCOUNTER — Encounter: Payer: PPO | Admitting: Family Medicine

## 2019-05-25 ENCOUNTER — Other Ambulatory Visit: Payer: Self-pay | Admitting: Cardiology

## 2019-05-25 NOTE — Telephone Encounter (Signed)
22m Scr 1.14 09/29/18 90.7kg Lovw/mcalhany 01/31/19

## 2019-05-31 ENCOUNTER — Other Ambulatory Visit: Payer: Self-pay | Admitting: Cardiovascular Disease

## 2019-08-01 ENCOUNTER — Encounter: Payer: Self-pay | Admitting: Cardiovascular Disease

## 2019-08-01 ENCOUNTER — Other Ambulatory Visit: Payer: Self-pay

## 2019-08-01 ENCOUNTER — Ambulatory Visit (INDEPENDENT_AMBULATORY_CARE_PROVIDER_SITE_OTHER): Payer: PPO | Admitting: Cardiovascular Disease

## 2019-08-01 VITALS — BP 142/86 | HR 77 | Ht 69.0 in | Wt 198.0 lb

## 2019-08-01 DIAGNOSIS — E782 Mixed hyperlipidemia: Secondary | ICD-10-CM

## 2019-08-01 DIAGNOSIS — I10 Essential (primary) hypertension: Secondary | ICD-10-CM | POA: Diagnosis not present

## 2019-08-01 DIAGNOSIS — I255 Ischemic cardiomyopathy: Secondary | ICD-10-CM | POA: Diagnosis not present

## 2019-08-01 DIAGNOSIS — I2510 Atherosclerotic heart disease of native coronary artery without angina pectoris: Secondary | ICD-10-CM

## 2019-08-01 DIAGNOSIS — I493 Ventricular premature depolarization: Secondary | ICD-10-CM

## 2019-08-01 DIAGNOSIS — I5042 Chronic combined systolic (congestive) and diastolic (congestive) heart failure: Secondary | ICD-10-CM | POA: Diagnosis not present

## 2019-08-01 NOTE — Patient Instructions (Signed)

## 2019-08-01 NOTE — Progress Notes (Signed)
Chief Complaint  Patient presents with  . Follow-up    CAD    History of Present Illness: 85 yo male with history of CAD, PVCs, HLD, HTN, mitral regurgitation and CVA who is here today for follow up. He hadbeen followed by Dr. Gwenlyn Found and switched to my office in 2018.I performed his cardiac cath in May 2017. His cardiac cath showed a severe mid LAD stenosis treated with a drug eluting stent and a severe stenosis in the obtuse marginal artery treated with a drug eluting stent. Echo May 2017 with LVEF=30-35% prior to his PCI. After the [facility] in August 2017 showed improvement in LVEF to 40-45%. Also noted to have mild mitral valve regurgitation. He is known to have PVCs. He has stopped his Plavix due to easy bruising.His Coreg was stopped due to bradycardia. He was seen in our office February 2019 by Lyda Jester, PA for PVCs. Cardiac monitor February 2019 with frequent PVCs. His Coreg was restarted. Echo showed reduced LVEF=35-40% felt to be tachy mediated. He had a stroke in May 2019. Echo May 2019 with LVEF=30-35%. Repeat limited echo with contrast 01/25/18 with LVEF estimated at 20-25% but no thrombus identified. TEE on 01/26/18 with LVEF=20-25% with trivial AI, mild to moderate MR. No intracardiac thrombus noted. He was discharged home on Eliquis due to his low EF and potential to form LV thrombus. He was also discharged on amiodarone. He was seen in our EP clinic by Dr. Lennie Odor. Latest Echo 05/12/18 with LVEF=20-25%. He did not tolerate amiodarone due to bad dreams and this was stopped. Dr. Lennie Odor started mexiletine at the office visit 05/19/18 but he did not tolerate this due to weakness and fatigue.   He is here today for follow up. The patient denies any chest pain, dyspnea, palpitations, lower extremity edema, orthopnea, PND, dizziness, near syncope or syncope.   Primary Care Physician: [name], MD  Past Medical History:  Diagnosis Date  . Angina decubitus ([place])   . BPH  with elevated PSA 01/06/2017   Has seen urology in the past, declines any treatment  . Bradycardia 04/20/2017  . CAD (coronary artery disease)   . Cerebellar infarct ([place])   . 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 CHF (congestive heart failure) ([place])   . Coronary artery disease involving native coronary artery of native heart with unstable angina pectoris ([place])   . Frequent unifocal PVCs    Palpitations since early 20s, PVCs and pairs  . History of CVA (cerebrovascular accident) without residual deficits 12/20/2010   CVA Nov 2007   . Hypercholesteremia 01/17/2011   Currently taking red yeast rice extract   . Hyperlipidemia   . Hypertension   . Hypertension, benign 12/20/2010   hypertension   . Ischemic cardiomyopathy   . Palpitations 12/25/2015   palpitations   . Prediabetes   . PVC's (premature ventricular contractions) 01/23/2016  . Stroke [facility]) 2009  . Subclinical hypothyroidism 12/21/2015    Past Surgical History:  Procedure Laterality Date  . CARDIAC CATHETERIZATION N/A 01/23/2016   Procedure: Right/Left Heart Cath and Coronary Angiography;  Surgeon: Burnell Blanks, MD;  Location: [place] CV LAB;  Service: Cardiovascular;  Laterality: N/A;  . CARDIAC CATHETERIZATION N/A 01/23/2016   Procedure: Coronary Stent Intervention;  Surgeon: Burnell Blanks, MD;  Location: [place] CV LAB;  Service: Cardiovascular;  Laterality: N/A;  . HERNIA REPAIR  06/1992  . TEE WITHOUT CARDIOVERSION N/A 01/26/2018   Procedure: TRANSESOPHAGEAL ECHOCARDIOGRAM (TEE);  Surgeon:  Bensimhon, Shaune Pascal, MD;  Location[facility] ENDOSCOPY;  Service: Cardiovascular;  Laterality: N/A;    Current Outpatient Medications  Medication Sig Dispense Refill  . Ascorbic Acid (VITAMIN C PO) Take 250 mg by mouth daily.     Marland Kitchen aspirin 81 MG tablet Take 81 mg by mouth daily.     . carvedilol (COREG) 3.125 MG tablet TAKE 1 TABLET BY MOUTH TWICE A DAY 180 tablet 2  . Cholecalciferol (VITAMIN  D PO) Take 2,000 Units by mouth 2 (two) times a week.     . Coenzyme Q10 (COQ10 PO) Take 50 mg by mouth.    Arne [place] 5 MG TABS tablet TAKE 1 TABLET BY MOUTH TWICE A DAY 180 tablet 1  . furosemide (LASIX) 20 MG tablet Take 20 mg by mouth as needed for fluid.    Marland Kitchen losartan (COZAAR) 25 MG tablet Take 0.5 tablets (12.5 mg total) by mouth daily. 90 tablet 3  . Magnesium 125 MG CAPS Take 1 tablet by mouth daily.     . Multiple Vitamin (MULTIVITAMIN) tablet Take 1 tablet by mouth daily.    . Potassium 99 MG TABS Take 99 mg by mouth daily.     . tamsulosin (FLOMAX) 0.4 MG CAPS capsule Take 1 capsule (0.4 mg total) by mouth daily. 90 capsule 3   No current facility-administered medications for this visit.     No Known Allergies  Social History   Socioeconomic History  . Marital status: Married    Spouse name: Not on file  . Number of children: Not on file  . Years of education: Not on file  . Highest education level: Not on file  Occupational History  . Not on file  Social Needs  . Financial resource strain: Not on file  . Food insecurity    Worry: Not on file    Inability: Not on file  . Transportation needs    Medical: Not on file    Non-medical: Not on file  Tobacco Use  . Smoking status: Never Smoker  . Smokeless tobacco: Never Used  Substance and Sexual Activity  . Alcohol use: No  . Drug use: No  . Sexual activity: Not Currently  Lifestyle  . Physical activity    Days per week: Not on file    Minutes per session: Not on file  . Stress: Not on file  Relationships  . Social Herbalist on phone: Not on file    Gets together: Not on file    Attends religious service: Not on file    Active member of club or organization: Not on file    Attends meetings of clubs or organizations: Not on file    Relationship status: Not on file  . Intimate partner violence    Fear of current or ex partner: Not on file    Emotionally abused: Not on file    Physically abused:  Not on file    Forced sexual activity: Not on file  Other Topics Concern  . Not on file  Social History Narrative  . Not on file    Family History  Problem Relation Age of Onset  . Stroke Mother   . Early death Mother   . Cancer Brother     Review of Systems:  As stated in the HPI and otherwise negative.   BP (!) 142/86   Pulse 77   Ht 5\' 9"  (1.753 m)   Wt 198 lb (89.8 kg)   SpO2  97%   BMI 29.24 kg/m   Physical Examination: General: Well developed, well nourished, NAD  HEENT: OP clear, mucus membranes moist  SKIN: warm, dry. No rashes. Neuro: No focal deficits  Musculoskeletal: Muscle strength 5/5 all ext  Psychiatric: Mood and affect normal  Neck: No JVD, no carotid bruits, no thyromegaly, no lymphadenopathy.  Lungs:Clear bilaterally, no wheezes, rhonci, crackles Cardiovascular: Regular rate and rhythm. No murmurs, gallops or rubs. Abdomen:Soft. Bowel sounds present. Non-tender.  Extremities: No lower extremity edema. Pulses are 2 + in the bilateral DP/PT.  Echo 05/12/18: Left ventricle: The cavity size was mildly dilated. Wall   thickness was increased in a pattern of mild LVH. Systolic   function was severely reduced. The estimated ejection fraction   was in the range of 20% to 25%. Diffuse hypokinesis. Akinesis of   the apical myocardium. Features are consistent with a   pseudonormal left ventricular filling pattern, with concomitant   abnormal relaxation and increased filling pressure (grade 2   diastolic dysfunction). Doppler parameters are consistent with   high ventricular filling pressure. - Aortic valve: Transvalvular velocity was within the normal range.   There was no stenosis. There was mild regurgitation. - Mitral valve: Transvalvular velocity was within the normal range.   There was no evidence for stenosis. There was mild regurgitation. - Left atrium: The atrium was severely dilated. - Right ventricle: The cavity size was normal. Wall thickness  was   normal. Systolic function was normal. - Tricuspid valve: There was mild-moderate regurgitation. - Pulmonary arteries: Systolic pressure was severely increased. PA   peak pressure: 60 mm Hg (S).  EKG:  EKG is not ordered today. The ekg ordered today demonstrates   Recent Labs: 09/29/2018: ALT 16; BUN 17; Creat 1.14; Hemoglobin 14.8; Platelets 157; Potassium 4.4; Sodium 140   Lipid Panel    Component Value Date/Time   CHOL 213 (H) 09/29/2018 0831   TRIG 106 09/29/2018 0831   HDL 53 09/29/2018 0831   CHOLHDL 4.0 09/29/2018 0831   VLDL 13 01/22/2018 0720   LDLCALC 138 (H) 09/29/2018 0831     Wt Readings from Last 3 Encounters:  08/01/19 198 lb (89.8 kg)  01/31/19 196 lb (88.9 kg)  09/29/18 200 lb (90.7 kg)     Other studies Reviewed: Additional studies/ records that were reviewed today include: . Review of the above records demonstrates:   Assessment and Plan:   1. CAD without angina: He has no chest pain. He does not tolerate statins. Continue ASA and Coreg.   2. HTN: BP is well controlled.   3. Hyperlipidemia: He does not tolerate statins. He does not wish to consider alternatives to statins such as Repatha or Praluent.   4. Ischemic cardiomyopathy/Chronic systolic CHF: No volume overload on exam. He has not tolerated high doses of beta blockers due to bradycardia. Continue beta blocker, ARB and Lasix.Marland Kitchen He was seen in the EP clinic by Dr. Lennie Odor and ICD was not recommended. Discussion around the possibility of the PVCs causing contributing to his LV systolic dysfunction. He did not tolerate amiodarone or mexilitine. He tells me that he does not wish to try any other medications. He feels well and does not wish to pursue other testing.   5. PVCs: He has been seen in the EP clinic. He did not tolerate amiodarone or mexilitine. He is tolerating Coreg. No palpitations. He does not wish to pursue more testing at this time. He does not wish to go back to the EP  clinic.    Current medicines are reviewed at length with the patient today.  The patient does not have concerns regarding medicines.  The following changes have been made:  no change  Labs/ tests ordered today include:   No orders of the defined types were placed in this encounter.    Disposition:   FU with me in 6  months   Signed, Lauree Chandler, MD 08/01/2019 2:59 PM    [address], [place], [place]  [address] Phone: [phone]; Fax: [phone]

## 2019-08-17 ENCOUNTER — Other Ambulatory Visit: Payer: Self-pay

## 2019-09-28 ENCOUNTER — Encounter: Payer: PPO | Admitting: Family Medicine

## 2019-10-12 ENCOUNTER — Other Ambulatory Visit: Payer: Self-pay | Admitting: Family Medicine

## 2019-10-12 DIAGNOSIS — Z8673 Personal history of transient ischemic attack (TIA), and cerebral infarction without residual deficits: Secondary | ICD-10-CM

## 2019-10-12 DIAGNOSIS — R413 Other amnesia: Secondary | ICD-10-CM

## 2019-10-12 NOTE — Progress Notes (Signed)
Wife in office, declining cognition memory changes  family very concerned He gets confused, puts groceries in wrong spots, blames others, gets easily frustrated He has cancelled a lot of appts  Neurology appt to be made

## 2019-10-14 ENCOUNTER — Other Ambulatory Visit: Payer: Self-pay

## 2019-10-14 ENCOUNTER — Ambulatory Visit (INDEPENDENT_AMBULATORY_CARE_PROVIDER_SITE_OTHER): Payer: PPO | Admitting: Family Medicine

## 2019-10-14 VITALS — BP 150/78 | HR 56 | Temp 97.5°F | Resp 18 | Ht 69.0 in | Wt 200.6 lb

## 2019-10-14 DIAGNOSIS — R399 Unspecified symptoms and signs involving the genitourinary system: Secondary | ICD-10-CM

## 2019-10-14 DIAGNOSIS — R972 Elevated prostate specific antigen [PSA]: Secondary | ICD-10-CM | POA: Diagnosis not present

## 2019-10-14 DIAGNOSIS — I5022 Chronic systolic (congestive) heart failure: Secondary | ICD-10-CM | POA: Diagnosis not present

## 2019-10-14 DIAGNOSIS — R7303 Prediabetes: Secondary | ICD-10-CM | POA: Diagnosis not present

## 2019-10-14 DIAGNOSIS — N400 Enlarged prostate without lower urinary tract symptoms: Secondary | ICD-10-CM | POA: Diagnosis not present

## 2019-10-14 DIAGNOSIS — I10 Essential (primary) hypertension: Secondary | ICD-10-CM

## 2019-10-14 LAB — URINALYSIS, ROUTINE W REFLEX MICROSCOPIC
Bilirubin Urine: NEGATIVE
Glucose, UA: NEGATIVE
Hgb urine dipstick: NEGATIVE
Ketones, ur: NEGATIVE
Leukocytes,Ua: NEGATIVE
Nitrite: NEGATIVE
Protein, ur: NEGATIVE
Specific Gravity, Urine: 1.01 (ref 1.001–1.03)
pH: 6 (ref 5.0–8.0)

## 2019-10-14 NOTE — Progress Notes (Signed)
   Subjective:    Patient ID: Patrick Maxwell, male    DOB: 1930-12-14, 86 y.o.   MRN: [id number]  Patient presents for Urinary Tract Infection (dysuria, no blood, started a while ago, no meds taken)  Pt here with occ burning typically at night time on and off for the past month. Denies any blood in the urine. He has known BPH on flomax. Denies and pain, no fever, no chills. No change in bowels No new meds  States she just wanted to leave a sample  Reviewed his meds, states he is taking everything Note, see phone note, family has had concerns about his declining memory and cognition changes, he of course states nothing is wrong, but I was able to advise him it was due to f/u with neurology with his stroke history and he agreed    Review Of Systems:  GEN- denies fatigue, fever, weight loss,weakness, recent illness HEENT- denies eye drainage, change in vision, nasal discharge, CVS- denies chest pain, palpitations RESP- denies SOB, cough, wheeze ABD- denies N/V, change in stools, abd pain GU-+dysuria, denies  hematuria, dribbling, incontinence MSK- denies joint pain, muscle aches, injury Neuro- denies headache, dizziness, syncope, seizure activity       Objective:    BP (!) 150/78 (BP Location: Right Arm, Patient Position: Sitting, Cuff Size: Normal)   Pulse (!) 56   Temp (!) 97.5 F (36.4 C) (Oral)   Resp 18   Ht 5\' 9"  (1.753 m)   Wt 200 lb 9.6 oz (91 kg)   SpO2 95%   BMI 29.62 kg/m  GEN- NAD, alert and oriented x3 HEENT- PERRL, EOMI, non injected sclera, pink conjunctiva, MMM, oropharynx clear Neck- Supple, no thyromegaly CVS-  RR , bradycardia, no murmur RESP-CTAB ABD-NABS,soft,NT,ND, no CVA tenderness EXT- No edema Pulses- Radial  2+        Assessment & Plan:      Problem List Items Addressed This Visit      Unprioritized   BPH with elevated PSA    Continue flomax, has seen urology declines further treatment UA looks good Check urine culture       CHF (congestive heart failure) (HCC)    Currently compensated      Hypertension, benign    BP a little elevated in office, pt feels well No changes to meds Has extensive cardiac history , will obtain labs today Reviewed cardiology note from Nov       Relevant Orders   CBC with Differential/Platelet (Completed)   Comprehensive metabolic panel (Completed)   Prediabetes   Relevant Orders   Hemoglobin A1c (Completed)    Other Visit Diagnoses    UTI symptoms    -  Primary   Relevant Orders   Urinalysis, Routine w reflex microscopic (Completed)   Urine Culture (Completed)      Note: This dictation was prepared with Dragon dictation along with smaller phrase technology. Any transcriptional errors that result from this process are unintentional.

## 2019-10-14 NOTE — Patient Instructions (Signed)
F/u pending results 

## 2019-10-15 LAB — COMPREHENSIVE METABOLIC PANEL
AG Ratio: 1.6 (calc) (ref 1.0–2.5)
ALT: 15 U/L (ref 9–46)
AST: 20 U/L (ref 10–35)
Albumin: 4.3 g/dL (ref 3.6–5.1)
Alkaline phosphatase (APISO): 58 U/L (ref 35–144)
BUN/Creatinine Ratio: 16 (calc) (ref 6–22)
BUN: 18 mg/dL (ref 7–25)
CO2: 29 mmol/L (ref 20–32)
Calcium: 9.6 mg/dL (ref 8.6–10.3)
Chloride: 104 mmol/L (ref 98–110)
Creat: 1.15 mg/dL — ABNORMAL HIGH (ref 0.70–1.11)
Globulin: 2.7 g/dL (calc) (ref 1.9–3.7)
Glucose, Bld: 93 mg/dL (ref 65–99)
Potassium: 4.8 mmol/L (ref 3.5–5.3)
Sodium: 141 mmol/L (ref 135–146)
Total Bilirubin: 0.7 mg/dL (ref 0.2–1.2)
Total Protein: 7 g/dL (ref 6.1–8.1)

## 2019-10-15 LAB — CBC WITH DIFFERENTIAL/PLATELET
Absolute Monocytes: 509 cells/uL (ref 200–950)
Basophils Absolute: 32 cells/uL (ref 0–200)
Basophils Relative: 0.6 %
Eosinophils Absolute: 111 cells/uL (ref 15–500)
Eosinophils Relative: 2.1 %
HCT: 44.3 % (ref 38.5–50.0)
Hemoglobin: 15.1 g/dL (ref 13.2–17.1)
Lymphs Abs: 1373 cells/uL (ref 850–3900)
MCH: 31.3 pg (ref 27.0–33.0)
MCHC: 34.1 g/dL (ref 32.0–36.0)
MCV: 91.9 fL (ref 80.0–100.0)
MPV: 12.1 fL (ref 7.5–12.5)
Monocytes Relative: 9.6 %
Neutro Abs: 3275 cells/uL (ref 1500–7800)
Neutrophils Relative %: 61.8 %
Platelets: 159 10*3/uL (ref 140–400)
RBC: 4.82 10*6/uL (ref 4.20–5.80)
RDW: 13 % (ref 11.0–15.0)
Total Lymphocyte: 25.9 %
WBC: 5.3 10*3/uL (ref 3.8–10.8)

## 2019-10-15 LAB — URINE CULTURE
MICRO NUMBER:: 10070961
Result:: NO GROWTH
SPECIMEN QUALITY:: ADEQUATE

## 2019-10-15 LAB — HEMOGLOBIN A1C
Hgb A1c MFr Bld: 5.4 % of total Hgb (ref <5.7)
Mean Plasma Glucose: 108 (calc)
eAG (mmol/L): 6 (calc)

## 2019-10-16 ENCOUNTER — Encounter: Payer: Self-pay | Admitting: Family Medicine

## 2019-10-16 NOTE — Assessment & Plan Note (Signed)
BP a little elevated in office, pt feels well No changes to meds Has extensive cardiac history , will obtain labs today Reviewed cardiology note from Nov

## 2019-10-16 NOTE — Assessment & Plan Note (Signed)
Continue flomax, has seen urology declines further treatment UA looks good Check urine culture

## 2019-10-16 NOTE — Assessment & Plan Note (Signed)
Currently compensated 

## 2019-10-20 ENCOUNTER — Ambulatory Visit: Payer: PPO | Admitting: Neurology

## 2019-10-31 ENCOUNTER — Ambulatory Visit: Payer: PPO | Admitting: Neurology

## 2019-11-29 ENCOUNTER — Encounter: Payer: Self-pay | Admitting: Neurology

## 2019-11-29 ENCOUNTER — Other Ambulatory Visit: Payer: Self-pay

## 2019-11-29 ENCOUNTER — Ambulatory Visit: Payer: PPO | Admitting: Neurology

## 2019-11-29 VITALS — BP 171/94 | HR 75 | Temp 97.1°F | Ht 69.0 in | Wt 200.4 lb

## 2019-11-29 DIAGNOSIS — I6523 Occlusion and stenosis of bilateral carotid arteries: Secondary | ICD-10-CM

## 2019-11-29 DIAGNOSIS — G3184 Mild cognitive impairment, so stated: Secondary | ICD-10-CM

## 2019-11-29 NOTE — Progress Notes (Signed)
[facility] [address] [place]. [place] [address] [phone]       OFFICE FOLLOW UP NOTE  [name] Date of Birth:  1931-07-24 Medical Record Number:  [id number]   Reason for Referral:  hospital stroke follow up  CHIEF COMPLAINT:  Chief Complaint  Patient presents with  . 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 History obtained from patient and chart review. Reviewed all radiology images and labs personally.  Patrick Maxwell is a 86 year old male with history of CAD, CHF, HTN, HLD, PVCs, and stroke who was admitted for vertigo, gait difficulty, and ataxia. He had a stroke about 10 years ago with [place] and was told he had a small left temporal stroke. His speech resolved in 6 months. This time he was admitted for vertigo, not able to walk, with ataxia on 01/20/18. MRI showed left PICA infarct, and old left BG/CRinfarct. CTA head and neck left PICA occlusion with diffuse intracranial stenosis including right cavernous SCA and right M1. EF 30 to 35%, down from 35 to 40% in 10/2017. He also had a 48-hour Holter monitoring PTAshowed frequent PVCs which he was put on Coreg. A1c 5.7 and LDL 97 this time. His stroke could be due to PICAocclusion due to atherosclerosis or cardioembolic given low EF. Had cardiology consultation, repeat 2D echo with contrast showed EF 20-25% but no LV thrombus. Had TEE again showed EF 20-25% with global HK but no LV or LA thrombus. Given the low EF and embolic stroke, it was discussed with Dr. Cathie Olden, pt and his wife, and decided to start eliquis 5mg  bid and continue ASA 81mg  given hx of CAD. Advised to continue to follow up with  cardiology. Also recommended to repeat TTE in 3 months, if EF > 30-35%, eliquis can be discontinued. Patient discharged to CIR.    Patient is being seen today for hospital follow-up and is accompanied by his wife.  He states all symptoms have resolved and has recently graduated from physical therapy but continues to do home exercises.  He states active by taking frequent walks during the day, using treadmill or gardening.  He does continue to be compliant with Eliquis and aspirin with mild bruising but no bleeding.  States that he occasionally will take his Lipitor and his wife states also that "he really does not need this medication due to his levels being good".  Reviewed LDL level 97 and our expectations of LDL less than 70 and reasoning behind wanting a lower cholesterol level and patient states he will be compliant with this medication.  He denies side effects such as myalgias with the Lipitor dose.  Blood pressure satisfactory 127/69.  He does state that he has been having events where he will be sitting in his chair and start to feel lightheaded with blurry vision and will check his blood pressure and it will be 100s/50s with a heart rate of 30s to 40s that will last for approximately 15 minutes.  He is unable to state whether this was occurring prior to the stroke.  He is unable to correlate this to any activity, position change or medication administration.  He has  recently decreased Pacerone on own but did advise PCP and cardiologist as he was having weird dreams and these dreams resolved since decreasing dose.  He will be having a 2D echo on 05/19/2018 to follow-up on low EF.  He did have recent follow-up visit with cardiologist with no new orders at this time.  Advised wife and patient to notify PCP and cardiologist in regards to symptomatic hypotension episodes.  Patient denies new or worsening stroke/TIA symptoms. Update 11/29/2019 :  He returns for follow-up after last visit a year and half ago.  Is  accompanied by his wife.  Patient denies any recurrent stroke or TIA symptoms.  He is tolerating Eliquis and baby aspirin does bruise easily but has had no unfortunately any bleeding episodes.  He states his blood pressures well controlled at home and usually in the [id number] systolic range but today it is elevated in office because he is anxious and upset.  Patient was advised to have a follow-up lipid profile checked at last visit but he did not do that in fact he canceled all his pending medical appointments as he refused to go out for the Covid pandemic for almost a year.  He missed a couple of follow-up appointments in office.  But now his wife seems to have convinced him to keep doctors appointments and see agrees that his hearing as well as vision seems to be getting worse.  Is also having memory and mild cognitive difficulties but he feels that these are unchanged.  He has not been participating in any cognitively challenging activities like solving crossword puzzles, playing bridge of sudoku.  He scored 24/30 on the Mini-Mental status exam today.  He has not had any follow-up lipid profile or carotid ultrasound check from more than a year and a half. ROS:   14 system review of systems performed and negative with exception of easy bruising, memory loss, decreased hearing, poor vision, easy bruising, hearing loss, urination problems, numbness, weakness, dizziness, depression, anxiety and sleepiness  PMH:  Past Medical History:  Diagnosis Date  . Angina decubitus ([place])   . BPH with elevated PSA 01/06/2017   Has seen urology in the past, declines any treatment  . Bradycardia 04/20/2017  . CAD (coronary artery disease)   . Cerebellar infarct ([place])   . Cerebral infarction involving cerebellar artery ([place]) 01/26/2018  . CHF (congestive heart failure) ([place])   . Coronary artery disease involving native coronary artery of native heart with unstable angina pectoris ([place])   . Frequent unifocal PVCs     Palpitations since early 20s, PVCs and pairs  . History of CVA (cerebrovascular accident) without residual deficits 12/20/2010   CVA Nov 2007   . Hypercholesteremia 01/17/2011   Currently taking red yeast rice extract   . Hyperlipidemia   . Hypertension   . Hypertension, benign 12/20/2010   hypertension   . Ischemic cardiomyopathy   . Palpitations 12/25/2015   palpitations   . Prediabetes   . PVC's (premature ventricular contractions) 01/23/2016  . Stroke [facility]) 2009  . Subclinical hypothyroidism 12/21/2015    PSH:  Past Surgical History:  Procedure Laterality Date  . CARDIAC CATHETERIZATION N/A 01/23/2016   Procedure: Right/Left Heart Cath and Coronary Angiography;  Surgeon: Burnell Blanks, MD;  Location: [facility];  Service: Cardiovascular;  Laterality: N/A;  . CARDIAC CATHETERIZATION N/A 01/23/2016   Procedure: Coronary Stent Intervention;  Surgeon: Burnell Blanks, MD;  Location: [facility];  Service: Cardiovascular;  Laterality: N/A;  .  HERNIA REPAIR  06/1992  . TEE WITHOUT CARDIOVERSION N/A 01/26/2018   Procedure: TRANSESOPHAGEAL ECHOCARDIOGRAM (TEE);  Surgeon: Jolaine Artist, MD;  Location: [facility];  Service: Cardiovascular;  Laterality: N/A;    Social History:  Social History   Socioeconomic History  . Marital status: Married    Spouse name: Not on file  . Number of children: Not on file  . Years of education: Not on file  . Highest education level: Not on file  Occupational History  . Not on file  Tobacco Use  . Smoking status: Never Smoker  . Smokeless tobacco: Never Used  Substance and Sexual Activity  . Alcohol use: No  . Drug use: No  . Sexual activity: Not Currently  Other Topics Concern  . Not on file  Social History Narrative  . Not on file   Social Determinants of Health   Financial Resource Strain:   . Difficulty of Paying Living Expenses: Not on file  Food Insecurity:   . Worried About Charity fundraiser in the  Last Year: Not on file  . Ran Out of Food in the Last Year: Not on file  Transportation Needs:   . Lack of Transportation (Medical): Not on file  . Lack of Transportation (Non-Medical): Not on file  Physical Activity:   . Days of Exercise per Week: Not on file  . Minutes of Exercise per Session: Not on file  Stress:   . Feeling of Stress : Not on file  Social Connections:   . Frequency of Communication with Friends and Family: Not on file  . Frequency of Social Gatherings with Friends and Family: Not on file  . Attends Religious Services: Not on file  . Active Member of Clubs or Organizations: Not on file  . Attends Archivist Meetings: Not on file  . Marital Status: Not on file  Intimate Partner Violence:   . Fear of Current or Ex-Partner: Not on file  . Emotionally Abused: Not on file  . Physically Abused: Not on file  . Sexually Abused: Not on file    Family History:  Family History  Problem Relation Age of Onset  . Stroke Mother   . Early death Mother   . Cancer Brother     Medications:   Current Outpatient Medications on File Prior to Visit  Medication Sig Dispense Refill  . Ascorbic Acid (VITAMIN C PO) Take 250 mg by mouth daily.     Marland Kitchen aspirin 81 MG tablet Take 81 mg by mouth daily.     . carvedilol (COREG) 3.125 MG tablet TAKE 1 TABLET BY MOUTH TWICE A DAY 180 tablet 2  . Cholecalciferol (VITAMIN D PO) Take 2,000 Units by mouth 2 (two) times a week.     . Coenzyme Q10 (COQ10 PO) Take 50 mg by mouth.    Arne Cleveland 5 MG TABS tablet TAKE 1 TABLET BY MOUTH TWICE A DAY 180 tablet 1  . losartan (COZAAR) 25 MG tablet Take 0.5 tablets (12.5 mg total) by mouth daily. 90 tablet 3  . Magnesium 125 MG CAPS Take 1 tablet by mouth daily.     . Multiple Vitamin (MULTIVITAMIN) tablet Take 1 tablet by mouth daily.    . Potassium 99 MG TABS Take 99 mg by mouth daily.     . tamsulosin (FLOMAX) 0.4 MG CAPS capsule Take 1 capsule (0.4 mg total) by mouth daily. 90 capsule 3    No current facility-administered medications on file prior to  visit.    Allergies:  No Known Allergies   Physical Exam  Vitals:   11/29/19 0820  BP: (!) 171/94  Pulse: 75  Temp: (!) 97.1 F (36.2 C)  Weight: 90.9 kg  Height: 5\' 9"  (1.753 m)   Body mass index is 29.59 kg/m. No exam data present  General: well developed, pleasant elderly Caucasian male, well nourished, seated, in no evident distress Head: head normocephalic and atraumatic.   Neck: supple with no carotid or supraclavicular bruits Cardiovascular: regular rate and rhythm, no murmurs Musculoskeletal: no deformity Skin:  no rash/petichiae Vascular:  Normal pulses all extremities  Neurologic Exam Mental Status: Awake and fully alert. Oriented to place and time. Recent and remote memory intact. Attention span, concentration and fund of knowledge diminished mood and affect appropriate.  Mini-Mental status exam score 24/30 with deficits in orientation, recall and following commands.  Clock drawing 4/4.  He had trouble  copying intersecting pentagons.  He was able to name only if 4 animals which walk on 4 legs Cranial Nerves: Fundoscopic exam reveals sharp disc margins. Pupils equal, briskly reactive to light. Extraocular movements full without nystagmus. Visual fields full to confrontation. Hearing intact. Facial sensation intact. Face, tongue, palate moves normally and symmetrically.  Motor: Normal bulk and tone. Normal strength in all tested extremity muscles. Sensory.: intact to touch , pinprick , position and vibratory sensation.  Coordination: Rapid alternating movements normal in all extremities. Finger-to-nose and heel-to-shin performed accurately bilaterally. Gait and Station: Arises from chair without difficulty. Stance is normal. Gait demonstrates normal stride length and balance . Able to heel, toe and tandem walk without difficulty.  Reflexes: 1+ and symmetric. Toes downgoing.    NIHSS  0 Modified Rankin   2   Diagnostic Data (Labs, Imaging, Testing)  CT Angio Head/Neck W Or Wo Contrast   01/21/2018 IMPRESSION:  1. Negative for large vessel occlusion. 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  2. There is little extracranial atherosclerosis, with no cervical carotid or vertebral artery stenosis. But there is moderate generalized intracranial atherosclerosis. Mostly mild intracranial arterial stenoses are noted - including in both vertebral artery V4 segments - with the following exceptions: - moderate right ICA siphon supraclinoid stenosis due to calcified plaque. - moderate right MCA bifurcation stenosis. - moderate left ACA A2 segment stenosis. - moderate and severe left MCA anterior division M3 stenoses.  3. Stable appearance of the acute left PICA infarct from the MRI earlier today. Cytotoxic edema with no hemorrhage or mass effect at this time.  4. Aortic Atherosclerosis (ICD10-I70.0).   MR Brain Wo Contrast  01/21/2018 IMPRESSION: 1. Acute left posterior inferior cerebellar artery territory infarct without hemorrhage or mass effect  2. Chronic small vessel disease with old left basal ganglia and corona radiata lacunar infarcts.   Dg Chest Portable 1 View: 01/21/2018 IMPRESSION:  Cardiomegaly with aortic atherosclerosis.  Mild CHF.  2D Echocardiogram 01/21/18 Compared to a prior study in 10/2017, the LVEF is lower at 30-35%. Cannot exclude apical thrombus - recommend limited Definity contrast study to evaluate.  EKG: SR with bigeminy.   TTE with contrast  - Left ventricle: The cavity size was normal. Systolic function was severely reduced. The estimated ejection fraction was in the range of 20% to 25%. Diffuse hypokinesis. Acoustic contrast opacification revealed no evidence ofthrombus. - Right ventricle: The cavity size was mildly dilated. Wall thickness was normal. Systolic function was mildly reduced. - Right atrium: The atrium was mildly  dilated.  TEE  LEFT VENTRICLE: EF =20-25%.Dilated. Severe global HK. No apical clot RIGHT VENTRICLE:Moderately to severe global HK LEFT ATRIUM:Markedly dilated. LA diameter +6.6cm. + smoke  LEFT ATRIAL APPENDAGE: No thrombus.  RIGHT ATRIUM:Normal AORTIC VALVE: Trileaflet. Mildly calcified. Trivial AI MITRAL VALVE: Normal. Mild to moderate central MR TRICUSPID VALVE: Normal.Moderate to severe TR. RVSP ~80mmHG PULMONIC VALVE: Grossly normal.Mild PI. INTERATRIAL SEPTUM:Borderline ASA.No PFO or ASD.Bubble study negative PERICARDIUM: No effusion DESCENDING AORTA:Severe plaque.  Frequent PVCs and bigeminy. 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    ASSESSMENT: Patrick Maxwell is a 86 y.o. 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 Vascular risk factors include CAD, CHF, HTN, HLD and PVCs.  Mild memory and cognitive difficulties due to mild cognitive impairment.   PLAN: -I had a long d/w patient about his recent stroke, risk for recurrent stroke/TIAs, personally independently reviewed imaging studies and stroke evaluation results and answered questions.Continue aspirin 81 mg daily and Eliquis (apixaban) daily history of pulmonary embolism and coronary artery disease for  secondary stroke prevention and maintain strict control of hypertension with blood pressure goal below 130/90, diabetes with hemoglobin A1c goal below 6.5% and lipids with LDL cholesterol goal below 70 mg/dL. I also advised the patient to eat a healthy diet with plenty of whole grains, cereals, fruits and vegetables, exercise regularly and maintain ideal body weight.  I recommend we check lipid profile, CBC and complete metabolic panel as he has not had this checked in a while.  I recommend he increase participation in cognitively challenging activities like solving crossword puzzles, playing bridge and sodoku.  We  also discussed memory compensation strategies.  Patient is refusing a trial of Aricept.  Followup in the future with me in 1 year or call earlier if necessary.  Greater than 50% of time during this 25 minute visit was spent on counseling,explanation of diagnosis of  mild cognitive impairment, left PICA infarct, reviewing risk factor management of , HTN, HLD and , planning of further management, discussion with patient and family and coordination of care Antony Contras, MD  [facility] [address] [place], [place] [address]  Phone [phone] Fax [phone]

## 2019-11-29 NOTE — Patient Instructions (Signed)
I had a long d/w patient about his recent stroke, risk for recurrent stroke/TIAs, personally independently reviewed imaging studies and stroke evaluation results and answered questions.Continue aspirin 81 mg daily and Eliquis (apixaban) daily history of pulmonary embolism and coronary artery disease for  secondary stroke prevention and maintain strict control of hypertension with blood pressure goal below 130/90, diabetes with hemoglobin A1c goal below 6.5% and lipids with LDL cholesterol goal below 70 mg/dL. I also advised the patient to eat a healthy diet with plenty of whole grains, cereals, fruits and vegetables, exercise regularly and maintain ideal body weight.  I recommend we check lipid profile, CBC and complete metabolic panel as he has not had this checked in a while.  I recommend he increase participation in cognitively challenging activities like solving crossword puzzles, playing bridge and sodoku.  We also discussed memory compensation strategies.  Patient is refusing a trial of Aricept.  Followup in the future with me in 1 year or call earlier if necessary. Memory Compensation Strategies  1. Use "WARM" strategy.  W= write it down  A= associate it  R= repeat it  M= make a mental note  2.   You can keep a Social worker.  Use a 3-ring notebook with sections for the following: calendar, important names and phone numbers,  medications, doctors' names/phone numbers, lists/reminders, and a section to journal what you did  each day.   3.    Use a calendar to write appointments down.  4.    Write yourself a schedule for the day.  This can be placed on the calendar or in a separate section of the Memory Notebook.  Keeping a  regular schedule can help memory.  5.    Use medication organizer with sections for each day or morning/evening pills.  You may need help loading it  6.    Keep a basket, or pegboard by the door.  Place items that you need to take out with you in the basket or on the  pegboard.  You may also want to  include a message board for reminders.  7.    Use sticky notes.  Place sticky notes with reminders in a place where the task is performed.  For example: " turn off the  stove" placed by the stove, "lock the door" placed on the door at eye level, " take your medications" on  the bathroom mirror or by the place where you normally take your medications.  8.    Use alarms/timers.  Use while cooking to remind yourself to check on food or as a reminder to take your medicine, or as a  reminder to make a call, or as a reminder to perform another task, etc.

## 2019-11-30 LAB — COMPREHENSIVE METABOLIC PANEL
ALT: 17 IU/L (ref 0–44)
AST: 24 IU/L (ref 0–40)
Albumin/Globulin Ratio: 2 (ref 1.2–2.2)
Albumin: 4.5 g/dL (ref 3.6–4.6)
Alkaline Phosphatase: 71 IU/L (ref 39–117)
BUN/Creatinine Ratio: 13 (ref 10–24)
BUN: 15 mg/dL (ref 8–27)
Bilirubin Total: 0.6 mg/dL (ref 0.0–1.2)
CO2: 25 mmol/L (ref 20–29)
Calcium: 9.1 mg/dL (ref 8.6–10.2)
Chloride: 104 mmol/L (ref 96–106)
Creatinine, Ser: 1.19 mg/dL (ref 0.76–1.27)
GFR calc Af Amer: 64 mL/min/{1.73_m2} (ref >59)
GFR calc non Af Amer: 55 mL/min/{1.73_m2} — ABNORMAL LOW (ref >59)
Globulin, Total: 2.2 g/dL (ref 1.5–4.5)
Glucose: 94 mg/dL (ref 65–99)
Potassium: 4.6 mmol/L (ref 3.5–5.2)
Sodium: 142 mmol/L (ref 134–144)
Total Protein: 6.7 g/dL (ref 6.0–8.5)

## 2019-11-30 LAB — CBC WITH DIFFERENTIAL
Basophils Absolute: 0 10*3/uL (ref 0.0–0.2)
Basos: 1 %
EOS (ABSOLUTE): 0.2 10*3/uL (ref 0.0–0.4)
Eos: 3 %
Hematocrit: 44.5 % (ref 37.5–51.0)
Hemoglobin: 15 g/dL (ref 13.0–17.7)
Immature Grans (Abs): 0 10*3/uL (ref 0.0–0.1)
Immature Granulocytes: 0 %
Lymphocytes Absolute: 1.3 10*3/uL (ref 0.7–3.1)
Lymphs: 24 %
MCH: 31.3 pg (ref 26.6–33.0)
MCHC: 33.7 g/dL (ref 31.5–35.7)
MCV: 93 fL (ref 79–97)
Monocytes Absolute: 0.4 10*3/uL (ref 0.1–0.9)
Monocytes: 7 %
Neutrophils Absolute: 3.4 10*3/uL (ref 1.4–7.0)
Neutrophils: 65 %
RBC: 4.8 x10E6/uL (ref 4.14–5.80)
RDW: 13.1 % (ref 11.6–15.4)
WBC: 5.3 10*3/uL (ref 3.4–10.8)

## 2019-11-30 LAB — LIPID PANEL
Chol/HDL Ratio: 3.9 ratio (ref 0.0–5.0)
Cholesterol, Total: 197 mg/dL (ref 100–199)
HDL: 51 mg/dL (ref >39)
LDL Chol Calc (NIH): 125 mg/dL — ABNORMAL HIGH (ref 0–99)
Triglycerides: 118 mg/dL (ref 0–149)
VLDL Cholesterol Cal: 21 mg/dL (ref 5–40)

## 2019-11-30 LAB — HEMOGLOBIN A1C
Est. average glucose Bld gHb Est-mCnc: 108 mg/dL
Hgb A1c MFr Bld: 5.4 % (ref 4.8–5.6)

## 2019-12-16 ENCOUNTER — Ambulatory Visit (HOSPITAL_COMMUNITY)
Admission: RE | Admit: 2019-12-16 | Discharge: 2019-12-16 | Disposition: A | Discharge status: Home / Self Care | Payer: PPO | Source: Ambulatory Visit | Attending: Neurology | Admitting: Neurology

## 2019-12-16 ENCOUNTER — Other Ambulatory Visit: Payer: Self-pay

## 2019-12-16 DIAGNOSIS — I6523 Occlusion and stenosis of bilateral carotid arteries: Secondary | ICD-10-CM | POA: Insufficient documentation

## 2019-12-16 NOTE — Progress Notes (Signed)
Carotid duplex has been completed.   Preliminary results in CV Proc.   Abram Sander 12/16/2019 1:16 PM

## 2019-12-23 ENCOUNTER — Other Ambulatory Visit: Payer: Self-pay | Admitting: Cardiovascular Disease

## 2019-12-27 ENCOUNTER — Other Ambulatory Visit (HOSPITAL_COMMUNITY): Payer: Self-pay | Admitting: Neurology

## 2019-12-27 MED ORDER — ATORVASTATIN CALCIUM 40 MG PO TABS: 40.0000 mg | ORAL_TABLET | Freq: Every day | ORAL | 11 refills | Status: DC

## 2019-12-27 NOTE — Progress Notes (Signed)
Kindly inform the patient that lab work from a month ago show normal complete metabolic panel , hemoglobin A1c and CBC but bad cholesterol is elevated and not acceptable and he needs to go back on Lipitor 40 mg daily and have ordered it.

## 2019-12-27 NOTE — Progress Notes (Signed)
Kindly inform the patient that carotid ultrasound study shows no significant bilateral carotid stenosis in the neck

## 2019-12-28 ENCOUNTER — Other Ambulatory Visit: Payer: Self-pay

## 2019-12-28 ENCOUNTER — Ambulatory Visit (INDEPENDENT_AMBULATORY_CARE_PROVIDER_SITE_OTHER): Payer: PPO | Admitting: Nurse Practitioner

## 2019-12-28 ENCOUNTER — Telehealth: Payer: Self-pay | Admitting: *Deleted

## 2019-12-28 ENCOUNTER — Encounter: Payer: Self-pay | Admitting: Nurse Practitioner

## 2019-12-28 VITALS — BP 180/102 | HR 77 | Temp 97.6°F | Resp 18 | Ht 69.0 in | Wt 202.0 lb

## 2019-12-28 DIAGNOSIS — L309 Dermatitis, unspecified: Secondary | ICD-10-CM

## 2019-12-28 MED ORDER — PREDNISONE 10 MG PO TABS: ORAL_TABLET | ORAL | 0 refills | Status: DC

## 2019-12-28 NOTE — Telephone Encounter (Signed)
LVM informing  the patient that lab work from a month ago shows normal complete metabolic panel , hemoglobin A1c and CBC, but bad cholesterol is elevated. Dr Leonie Man stated this is not acceptable and he needs to go back on Lipitor 40 mg daily. Dr Leonie Man has ordered it. Left # for questions.

## 2019-12-28 NOTE — Patient Instructions (Signed)
Take medication Prednisone as discussed and prescribed If you develop new symptoms as discussed and/or If your symptoms become worse or do not resolve seek medical attention

## 2019-12-28 NOTE — Progress Notes (Signed)
Acute Office Visit  Subjective:    Patient ID: Patrick Maxwell, male    DOB: [birth year], 86 y.o.   MRN: [id number]  Chief Complaint  Patient presents with  . Rash    abdomen and back, no itching, x1 week    HPI Patient is a 86 year old male presenting for a rash that started about a week ago that has spread. The first region was the front of chest just below his neck then spread to shoulders and back and upper inner portion of bilateral arms. The rash appeared as small light red colored bumps without pustule of blister and has not been pruritic. No drainage or odor. No known exposures or contacts. No new medications, detergent, soaps, or lotions. No fever/chills, gu/gi sxs, pain, sob, edema, neck stiffness, sore throat.    Medication and health history reviewed.   Past Medical History:  Diagnosis Date  . Angina decubitus ([place])   . BPH with elevated PSA 01/06/2017   Has seen urology in the past, declines any treatment  . Bradycardia 04/20/2017  . CAD (coronary artery disease)   . Cerebellar infarct ([place])   . Cerebral infarction involving cerebellar artery ([place]) 01/26/2018  . CHF (congestive heart failure) ([place])   . Coronary artery disease involving native coronary artery of native heart with unstable angina pectoris ([place])   . Frequent unifocal PVCs    Palpitations since early 20s, PVCs and pairs  . History of CVA (cerebrovascular accident) without residual deficits 12/20/2010   CVA Nov 2007   . Hypercholesteremia 01/17/2011   Currently taking red yeast rice extract   . Hyperlipidemia   . Hypertension   . Hypertension, benign 12/20/2010   hypertension   . Ischemic cardiomyopathy   . Palpitations 12/25/2015   palpitations   . Prediabetes   . PVC's (premature ventricular contractions) 01/23/2016  . Stroke [facility]) 2009  . Subclinical hypothyroidism 12/21/2015    Past Surgical History:  Procedure Laterality Date  . CARDIAC CATHETERIZATION N/A 01/23/2016   Procedure: Right/Left Heart  Cath and Coronary Angiography;  Surgeon: Burnell Blanks, MD;  Location: [place] [facility];  Service: Cardiovascular;  Laterality: N/A;  . CARDIAC CATHETERIZATION N/A 01/23/2016   Procedure: Coronary Stent Intervention;  Surgeon: Burnell Blanks, MD;  Location: [place] [facility];  Service: Cardiovascular;  Laterality: N/A;  . HERNIA REPAIR  06/1992  . TEE WITHOUT CARDIOVERSION N/A 01/26/2018   Procedure: TRANSESOPHAGEAL ECHOCARDIOGRAM (TEE);  Surgeon: Jolaine Artist, MD;  Location: [facility] ENDOSCOPY;  Service: Cardiovascular;  Laterality: N/A;    Family History  Problem Relation Age of Onset  . Stroke Mother   . Early death Mother   . Cancer Brother     Social History   Socioeconomic History  . Marital status: Married    Spouse name: Not on file  . Number of children: Not on file  . Years of education: Not on file  . Highest education level: Not on file  Occupational History  . Not on file  Tobacco Use  . Smoking status: Never Smoker  . Smokeless tobacco: Never Used  Substance and Sexual Activity  . Alcohol use: No  . Drug use: No  . Sexual activity: Not Currently  Other Topics Concern  . Not on file  Social History Narrative  . Not on file   Social Determinants of Health   Financial Resource Strain:   . Difficulty of Paying Living Expenses:   Food Insecurity:   . Worried About  Running Out of Food in the Last Year:   . [place] in the Last Year:   Transportation Needs:   . Lack of Transportation (Medical):   Marland Kitchen Lack of Transportation (Non-Medical):   Physical Activity:   . Days of Exercise per Week:   . Minutes of Exercise per Session:   Stress:   . Feeling of Stress :   Social Connections:   . Frequency of Communication with Friends and Family:   . Frequency of Social Gatherings with Friends and Family:   . Attends Religious Services:   . Active Member of Clubs or Organizations:   . Attends Archivist Meetings:   Marland Kitchen Marital Status:    Intimate Partner Violence:   . Fear of Current or Ex-Partner:   . Emotionally Abused:   Marland Kitchen Physically Abused:   . Sexually Abused:     Outpatient Medications Prior to Visit  Medication Sig Dispense Refill  . Ascorbic Acid (VITAMIN C PO) Take 250 mg by mouth daily.     Marland Kitchen aspirin 81 MG tablet Take 81 mg by mouth daily.     Marland Kitchen atorvastatin (LIPITOR) 40 MG tablet Take 1 tablet (40 mg total) by mouth daily. 30 tablet 11  . carvedilol (COREG) 3.125 MG tablet TAKE 1 TABLET BY MOUTH TWICE A DAY 180 tablet 2  . Cholecalciferol (VITAMIN D PO) Take 2,000 Units by mouth 2 (two) times a week.     . Coenzyme Q10 (COQ10 PO) Take 50 mg by mouth.    Arne Cleveland 5 MG TABS tablet TAKE 1 TABLET BY MOUTH TWICE A DAY 180 tablet 1  . losartan (COZAAR) 25 MG tablet TAKE 1/2 TABLET EVERY DAY 45 tablet 2  . Magnesium 125 MG CAPS Take 1 tablet by mouth daily.     . Multiple Vitamin (MULTIVITAMIN) tablet Take 1 tablet by mouth daily.    . Potassium 99 MG TABS Take 99 mg by mouth daily.     . tamsulosin (FLOMAX) 0.4 MG CAPS capsule Take 1 capsule (0.4 mg total) by mouth daily. 90 capsule 3   No facility-administered medications prior to visit.    No Known Allergies  Review of Systems  All other systems reviewed and are negative.      Objective:    Physical Exam Vitals and nursing note reviewed.  Constitutional:      General: He is not in acute distress.    Appearance: Normal appearance. He is well-developed and well-groomed.  HENT:     Head: Normocephalic.     Right Ear: Hearing and external ear normal.     Left Ear: Hearing and external ear normal.     Nose: Nose normal.  Eyes:     General: Lids are normal. Lids are everted, no foreign bodies appreciated.     Extraocular Movements: Extraocular movements intact.     Conjunctiva/sclera: Conjunctivae normal.     Pupils: Pupils are equal, round, and reactive to light.  Cardiovascular:     Rate and Rhythm: Normal rate.  Pulmonary:     Effort:  Pulmonary effort is normal.  Musculoskeletal:        General: Normal range of motion.     Cervical back: Normal range of motion and neck supple. No rigidity.  Skin:    General: Skin is warm and dry.     Capillary Refill: Capillary refill takes less than 2 seconds.          Comments: Small red bumps, non  pustule or blister, no opening, scattered without pattern form. No burrowing  To regions front chest just below neck line, back just below neck bilateral inner arm to upper portion of extremitiy  Neurological:     General: No focal deficit present.     Mental Status: He is alert and oriented to person, place, and time.  Psychiatric:        Behavior: Behavior is cooperative.     BP (!) 180/102 (BP Location: Left Arm, Patient Position: Sitting, Cuff Size: Normal)   Pulse 77   Temp 97.6 F (36.4 C) (Temporal)   Resp 18   Ht 5\' 9"  (1.753 m)   Wt 202 lb (91.6 kg)   SpO2 98%   BMI 29.83 kg/m  Wt Readings from Last 3 Encounters:  12/28/19 202 lb (91.6 kg)  11/29/19 200 lb 6.4 oz (90.9 kg)  10/14/19 200 lb 9.6 oz (91 kg)    There are no preventive care reminders to display for this patient.  There are no preventive care reminders to display for this patient.   Lab Results  Component Value Date   TSH 3.98 11/09/2017   Lab Results  Component Value Date   WBC 5.3 11/29/2019   HGB 15.0 11/29/2019   HCT 44.5 11/29/2019   MCV 93 11/29/2019   PLT 159 10/14/2019   Lab Results  Component Value Date   NA 142 11/29/2019   K 4.6 11/29/2019   CO2 25 11/29/2019   GLUCOSE 94 11/29/2019   BUN 15 11/29/2019   CREATININE 1.19 11/29/2019   BILITOT 0.6 11/29/2019   ALKPHOS 71 11/29/2019   AST 24 11/29/2019   ALT 17 11/29/2019   PROT 6.7 11/29/2019   ALBUMIN 4.5 11/29/2019   CALCIUM 9.1 11/29/2019   ANIONGAP 7 01/27/2018   Lab Results  Component Value Date   CHOL 197 11/29/2019   Lab Results  Component Value Date   HDL 51 11/29/2019   Lab Results  Component Value Date    LDLCALC 125 (H) 11/29/2019   Lab Results  Component Value Date   TRIG 118 11/29/2019   Lab Results  Component Value Date   CHOLHDL 3.9 11/29/2019   Lab Results  Component Value Date   HGBA1C 5.4 11/29/2019       Assessment & Plan:   Dermatitis is redness, soreness, and swelling (inflammation) of the skin. Contact dermatitis is a reaction to certain substances that touch the skin.  Symptoms of this condition may occur on your body anywhere the irritant has touched you or is touched by you.  This condition is treated by figuring out what caused the reaction and protecting your skin from further contact. Treatment may also include medicines and skin care.  Avoid the substance that caused your reaction. If you do not know what caused it, keep a journal to try to track what caused it.  Contact a health care provider if your condition gets worse or you have signs of infection such as swelling, tenderness, redness, soreness, or warmth in the affected area.  Take medication Prednisone as discussed and prescribed If you develop new symptoms as discussed and/or If your symptoms become worse or do not resolve seek medical attention  Problem List Items Addressed This Visit    None    Visit Diagnoses    Dermatitis    -  Primary   Relevant Medications   predniSONE (DELTASONE) 10 MG tablet     Follow Up: for non resolving or worsening symptoms.  Annie Main, FNP

## 2020-01-11 ENCOUNTER — Ambulatory Visit (INDEPENDENT_AMBULATORY_CARE_PROVIDER_SITE_OTHER): Payer: PPO | Admitting: Family Medicine

## 2020-01-11 ENCOUNTER — Other Ambulatory Visit: Payer: Self-pay

## 2020-01-11 ENCOUNTER — Encounter: Payer: Self-pay | Admitting: Family Medicine

## 2020-01-11 VITALS — BP 172/94 | HR 82 | Temp 98.1°F | Resp 16 | Ht 69.0 in | Wt 202.0 lb

## 2020-01-11 DIAGNOSIS — W57XXXA Bitten or stung by nonvenomous insect and other nonvenomous arthropods, initial encounter: Secondary | ICD-10-CM

## 2020-01-11 DIAGNOSIS — R21 Rash and other nonspecific skin eruption: Secondary | ICD-10-CM | POA: Diagnosis not present

## 2020-01-11 MED ORDER — DOXYCYCLINE HYCLATE 100 MG PO TABS: 100.0000 mg | ORAL_TABLET | Freq: Two times a day (BID) | ORAL | 0 refills | Status: DC

## 2020-01-11 NOTE — Patient Instructions (Addendum)
I am doing labs for tick related illness and to see if his eosinophils are elevated suggesting may allergic rash Start the doxycycline antibiotic twice a day Make sure you are not directly in the sun- the antibiotic will cause the skin to sunburn easier ( wear hat, long sleeves) If not improved in 1 week  I recommend referral to dermatology for biopsy ( Your Husband wanted to wait a week and see how this treatment does)  F/U pending results

## 2020-01-11 NOTE — Progress Notes (Signed)
Subjective:    Patient ID: Patrick Maxwell, male    DOB: June 17, 1931, 86 y.o.   MRN: [id number]  Patient presents for Rash (took prednisone, but saw no improvement)   Pt here with rash is worsening.  He was seen about 2 weeks ago secondary to erythematous maculopapular rash that had a rash across his chest.  He now has lesions over his entire trunk and his upper extremities.  He does not have any lesions on his soles.  He does not have any lesions on his lower extremities or feet.  He does remember pulling a tick off of his left forearm then he had to pustular-like lesions that came up one on the left forearm the other on his right upper back.  He denies any itching of the rash but it continues to spread despite taking the prednisone taper that was prescribed 2 weeks ago.  He has not had any fever no nausea vomiting no body aches or joint pain.  He has been no change in any of his other regular medication.   Review Of Systems:  GEN- denies fatigue, fever, weight loss,weakness, recent illness HEENT- denies eye drainage, change in vision, nasal discharge, CVS- denies chest pain, palpitations RESP- denies SOB, cough, wheeze ABD- denies N/V, change in stools, abd pain GU- denies dysuria, hematuria, dribbling, incontinence MSK- denies joint pain, muscle aches, injury Neuro- denies headache, dizziness, syncope, seizure activity       Objective:    BP (!) 172/94 (BP Location: Right Arm, Patient Position: Sitting, Cuff Size: Normal)   Pulse 82   Temp 98.1 F (36.7 C) (Temporal)   Resp 16   Ht 5\' 9"  (1.753 m)   Wt 202 lb (91.6 kg)   SpO2 99%   BMI 29.83 kg/m  GEN- NAD, alert and oriented x3 HEENT- PERRL, EOMI, non injected sclera, pink conjunctiva, MMM, oropharynx clear Neck- Supple, no LAD  CVS- RRR, no murmur RESP-CTAB ABD-NABS,soft,NT,ND Skin-generalized erythematous maculopapular rash various sizes over his entire trunk and down the arms.  No lesions on the palms no lesions on  the lower extremities.  No oral lesions or lesions on the face.  Lesions are blanching no specific petechial rash noted EXT- No edema Pulses- Radial,2+        Assessment & Plan:      Problem List Items Addressed This Visit    None    Visit Diagnoses    Rash and nonspecific skin eruption    -  Primary   Unclear cause of the eruption which is actually progressing despite not causing any particular symptoms No new medications to suggest a drug eruption.  He did have a tick bite but does not have the classic symptoms of Lyme or [place] spotted fever that one would expect.  If this was an allergic reaction or plant dermatitis he did not respond to the prednisone taper.  Unclear cause of what is actually causing the eruption.  Plan to check CBC metabolic we will go ahead and check tick antibodies.  We will go ahead and start him on doxycycline 100 mg twice daily.  We discussed with the dermatology but he wants to hold off and see what the blood work looks like to try antibiotic for a week first.   Relevant Orders   CBC with Differential/Platelet   Comprehensive metabolic panel   B. Burgdorfi Antibodies   Rocky mtn spotted fvr abs pnl(IgG+IgM)   Tick bite, initial encounter  Relevant Orders   B. Burgdorfi Antibodies   Rocky mtn spotted fvr abs pnl(IgG+IgM)      Note: This dictation was prepared with Dragon dictation along with smaller Company secretary. Any transcriptional errors that result from this process are unintentional.

## 2020-01-12 LAB — CBC WITH DIFFERENTIAL/PLATELET
Absolute Monocytes: 392 cells/uL (ref 200–950)
Basophils Absolute: 29 cells/uL (ref 0–200)
Basophils Relative: 0.6 %
Eosinophils Absolute: 132 cells/uL (ref 15–500)
Eosinophils Relative: 2.7 %
HCT: 44.5 % (ref 38.5–50.0)
Hemoglobin: 14.4 g/dL (ref 13.2–17.1)
Lymphs Abs: 1215 cells/uL (ref 850–3900)
MCH: 30.3 pg (ref 27.0–33.0)
MCHC: 32.4 g/dL (ref 32.0–36.0)
MCV: 93.5 fL (ref 80.0–100.0)
MPV: 11.9 fL (ref 7.5–12.5)
Monocytes Relative: 8 %
Neutro Abs: 3131 cells/uL (ref 1500–7800)
Neutrophils Relative %: 63.9 %
Platelets: 146 10*3/uL (ref 140–400)
RBC: 4.76 10*6/uL (ref 4.20–5.80)
RDW: 13.1 % (ref 11.0–15.0)
Total Lymphocyte: 24.8 %
WBC: 4.9 10*3/uL (ref 3.8–10.8)

## 2020-01-12 LAB — COMPREHENSIVE METABOLIC PANEL
AG Ratio: 1.7 (calc) (ref 1.0–2.5)
ALT: 16 U/L (ref 9–46)
AST: 20 U/L (ref 10–35)
Albumin: 4 g/dL (ref 3.6–5.1)
Alkaline phosphatase (APISO): 56 U/L (ref 35–144)
BUN/Creatinine Ratio: 18 (calc) (ref 6–22)
BUN: 22 mg/dL (ref 7–25)
CO2: 28 mmol/L (ref 20–32)
Calcium: 8.8 mg/dL (ref 8.6–10.3)
Chloride: 105 mmol/L (ref 98–110)
Creat: 1.23 mg/dL — ABNORMAL HIGH (ref 0.70–1.11)
Globulin: 2.3 g/dL (calc) (ref 1.9–3.7)
Glucose, Bld: 84 mg/dL (ref 65–99)
Potassium: 4.6 mmol/L (ref 3.5–5.3)
Sodium: 140 mmol/L (ref 135–146)
Total Bilirubin: 0.8 mg/dL (ref 0.2–1.2)
Total Protein: 6.3 g/dL (ref 6.1–8.1)

## 2020-01-12 LAB — ROCKY MTN SPOTTED FVR ABS PNL(IGG+IGM)
RMSF IgG: NOT DETECTED
RMSF IgM: NOT DETECTED

## 2020-01-12 LAB — B. BURGDORFI ANTIBODIES: B burgdorferi Ab IgG+IgM: <0.9 index

## 2020-01-13 ENCOUNTER — Other Ambulatory Visit: Payer: Self-pay | Admitting: *Deleted

## 2020-01-13 ENCOUNTER — Telehealth: Payer: Self-pay | Admitting: *Deleted

## 2020-01-13 DIAGNOSIS — R21 Rash and other nonspecific skin eruption: Secondary | ICD-10-CM

## 2020-01-13 DIAGNOSIS — W57XXXA Bitten or stung by nonvenomous insect and other nonvenomous arthropods, initial encounter: Secondary | ICD-10-CM

## 2020-01-13 NOTE — Telephone Encounter (Signed)
Call placed to patient. LMTRC.  

## 2020-01-13 NOTE — Telephone Encounter (Signed)
He can stop the eliquis and doxycycline then  Recommend urgent dermatology referral- place for rash

## 2020-01-13 NOTE — Telephone Encounter (Signed)
Received call from patient wife, Mariann Laster.   States that patient has has been taking ABTx since Wed for possible tick bite. States that rash has not improved, but actually has gotten worse. States that rash has now spread to neck.   States that she was researching on line and noted that Eliquis could cause reaction. Inquired if patient should stop medication even though he has been on it for >2 years.   Also states that she noted all labs were normal.

## 2020-01-16 NOTE — Telephone Encounter (Signed)
Call placed to patient and patient spouse made aware.   Agreeable to plan.

## 2020-01-18 NOTE — Telephone Encounter (Signed)
Received VM from patient spouse, Mariann Laster.   Reports that she is not comfortable stopping the Eliquis as patient had CVA <2 years prior.   MD please advise.

## 2020-01-18 NOTE — Telephone Encounter (Signed)
Call placed to patient and patient spouse made aware.   States that patient has not stopped low dose Eliquis at this time.   States that she will re-schedule with Dermatology.

## 2020-01-18 NOTE — Telephone Encounter (Signed)
I only told them okay, as they called in stating that they felt it was his eliquis and they had stopped it already Go back on the eliquis. I recommend he go to the dermatologist . [place] crews had already discussed this with them. I dont have anything else to give him to help the rash and I dont know the cause

## 2020-02-06 DIAGNOSIS — H353132 Nonexudative age-related macular degeneration, bilateral, intermediate dry stage: Secondary | ICD-10-CM | POA: Diagnosis not present

## 2020-02-06 DIAGNOSIS — H2513 Age-related nuclear cataract, bilateral: Secondary | ICD-10-CM | POA: Diagnosis not present

## 2020-02-06 DIAGNOSIS — H40013 Open angle with borderline findings, low risk, bilateral: Secondary | ICD-10-CM | POA: Diagnosis not present

## 2020-02-06 DIAGNOSIS — G43809 Other migraine, not intractable, without status migrainosus: Secondary | ICD-10-CM | POA: Diagnosis not present

## 2020-02-06 DIAGNOSIS — H35371 Puckering of macula, right eye: Secondary | ICD-10-CM | POA: Diagnosis not present

## 2020-02-06 DIAGNOSIS — H0100A Unspecified blepharitis right eye, upper and lower eyelids: Secondary | ICD-10-CM | POA: Diagnosis not present

## 2020-02-07 ENCOUNTER — Telehealth: Payer: Self-pay | Admitting: *Deleted

## 2020-02-07 NOTE — Telephone Encounter (Signed)
Received call from patient wife Mariann Laster.   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 States that he will be referred to specialist from their office.   Also reports that skin condition has not changed. Reports that she thinks she has talked him into seeing a dermatologist now.   Requested to have MD made aware.

## 2020-02-07 NOTE — Telephone Encounter (Signed)
Noted  

## 2020-02-13 DIAGNOSIS — R238 Other skin changes: Secondary | ICD-10-CM | POA: Diagnosis not present

## 2020-02-13 DIAGNOSIS — D485 Neoplasm of uncertain behavior of skin: Secondary | ICD-10-CM | POA: Diagnosis not present

## 2020-02-13 DIAGNOSIS — C9590 Leukemia, unspecified not having achieved remission: Secondary | ICD-10-CM | POA: Diagnosis not present

## 2020-02-13 DIAGNOSIS — L309 Dermatitis, unspecified: Secondary | ICD-10-CM | POA: Diagnosis not present

## 2020-02-13 DIAGNOSIS — L989 Disorder of the skin and subcutaneous tissue, unspecified: Secondary | ICD-10-CM | POA: Diagnosis not present

## 2020-03-02 ENCOUNTER — Other Ambulatory Visit: Payer: Self-pay | Admitting: Cardiovascular Disease

## 2020-03-02 NOTE — Telephone Encounter (Signed)
Eliquis 5mg  refill request received. Patient is 86 years old, weight-91.6kg, Crea-1.23 on 01/11/2020, Diagnosis-hx cva, Eliquis due to his low EF and potential to form LV thrombus per 08/01/2019 OV note by Dr. Angelena Form, and last seen by Dr. Angelena Form on 08/11/2019. Dose is appropriate based on dosing criteria. Will send in refill to requested pharmacy.

## 2020-03-05 ENCOUNTER — Telehealth: Payer: Self-pay | Admitting: *Deleted

## 2020-03-05 NOTE — Telephone Encounter (Signed)
Received call from patient spouse, Patrick Maxwell.   Reports that patient was been seen by [facility] and had biopsy performed. 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 States that patient has refused referral to Oncology/Hemtology. Per spouse, Dr. Pearline Cables stated that labs could be performed to F/U and advised patient to have blood drawn at [facility]   Patient is unwilling to go to lab draw station and wanted to have labs obtained at [facility].   Advised that PCP would have to approve lab orders prior to having them drawn at this time. Records requested from dermatology.   Also reports that patient is very hesitant about any type of treatment at this time. States that patient is very headstrong and will not change mind.   Advised that it is patient's right to refuse care.   Awaiting records from dermatology.

## 2020-03-20 ENCOUNTER — Telehealth: Payer: Self-pay | Admitting: Family Medicine

## 2020-03-20 NOTE — Telephone Encounter (Signed)
CB# [phone] Pt wife  would like speak with Dr.Homedale concerning her husband care

## 2020-03-21 NOTE — Telephone Encounter (Signed)
Received following message via secure chat from front office staff: I have Patrick Maxwell daughter of Zyden Suman on the phone the wife call yesterday Prentis thinking his wife is cheating I offered appt she said he want go thinks the doctor out to get him  Call placed to patient spouse, Mariann Laster.   Reports that he has been having AMS x4 days. States that patient is very delusional and having issues with reality.   Advised to have patient taken to ER. Advised that he requires urgent evaluation. Verbalized understanding.

## 2020-03-21 NOTE — Telephone Encounter (Signed)
Noted pt sent to ER for evaluation Due to AMS

## 2020-03-28 DIAGNOSIS — C9590 Leukemia, unspecified not having achieved remission: Secondary | ICD-10-CM | POA: Diagnosis not present

## 2020-04-17 ENCOUNTER — Other Ambulatory Visit: Payer: Self-pay

## 2020-05-29 ENCOUNTER — Other Ambulatory Visit: Payer: Self-pay | Admitting: Cardiovascular Disease

## 2020-06-08 ENCOUNTER — Other Ambulatory Visit: Payer: Self-pay

## 2020-06-08 ENCOUNTER — Telehealth: Payer: Self-pay | Admitting: Cardiovascular Disease

## 2020-06-08 MED ORDER — CARVEDILOL 3.125 MG PO TABS: 3.1250 mg | ORAL_TABLET | Freq: Two times a day (BID) | ORAL | 0 refills | Status: DC

## 2020-06-08 NOTE — Telephone Encounter (Signed)
*  STAT* If patient is at the pharmacy, call can be transferred to refill team.   1. Which medications need to be refilled? (please list name of each medication and dose if known) carvedilol (COREG) 3.125 MG tablet  2. Which pharmacy/location (including street and city if local pharmacy) is medication to be sent to? [id number] - [place], [place] - [address] AT [address]  3. Do they need a 30 day or 90 day supply? [place]

## 2020-06-26 ENCOUNTER — Ambulatory Visit (INDEPENDENT_AMBULATORY_CARE_PROVIDER_SITE_OTHER): Payer: PPO | Admitting: Family Medicine

## 2020-06-26 ENCOUNTER — Encounter: Payer: Self-pay | Admitting: Family Medicine

## 2020-06-26 ENCOUNTER — Other Ambulatory Visit: Payer: Self-pay

## 2020-06-26 VITALS — BP 160/88 | HR 64 | Temp 98.7°F | Resp 14 | Ht 69.0 in | Wt 198.0 lb

## 2020-06-26 DIAGNOSIS — I25110 Atherosclerotic heart disease of native coronary artery with unstable angina pectoris: Secondary | ICD-10-CM | POA: Diagnosis not present

## 2020-06-26 DIAGNOSIS — I10 Essential (primary) hypertension: Secondary | ICD-10-CM | POA: Diagnosis not present

## 2020-06-26 DIAGNOSIS — I5022 Chronic systolic (congestive) heart failure: Secondary | ICD-10-CM | POA: Diagnosis not present

## 2020-06-26 DIAGNOSIS — E038 Other specified hypothyroidism: Secondary | ICD-10-CM | POA: Diagnosis not present

## 2020-06-26 DIAGNOSIS — R4189 Other symptoms and signs involving cognitive functions and awareness: Secondary | ICD-10-CM

## 2020-06-26 DIAGNOSIS — E7800 Pure hypercholesterolemia, unspecified: Secondary | ICD-10-CM

## 2020-06-26 DIAGNOSIS — Z8673 Personal history of transient ischemic attack (TIA), and cerebral infarction without residual deficits: Secondary | ICD-10-CM | POA: Diagnosis not present

## 2020-06-26 NOTE — Assessment & Plan Note (Signed)
Known CAD/CHF, state he is taking his meds Does not do well with high doses of BP meds due to dizzy spells He will monitor BP at home, expect on average it is above  140, but he states it is normal at home Recommend he take all his meds prescribed He declines statin drug

## 2020-06-26 NOTE — Assessment & Plan Note (Signed)
There is no fluid overload Advised I would not recommend starting him on diuretics when they are not indicated He can also discuss further with his heart doctor

## 2020-06-26 NOTE — Progress Notes (Signed)
Subjective:    Patient ID: Patrick Maxwell, male    DOB: Jun 04, 1931, 86 y.o.   MRN: [id number]  Patient presents for Follow-up (is fasting) and Fluid Pills (states that he has been taking fluid pill once a month- would like refill)  Pt here to f/u chronic medical problems   No concerns, states he feels well  States she hasnt gone to heart doctor but he hasnt had any problems  He was seen by neurology due to history of Stroke and memory changes, given statin but he declines taking  He has been worked up for a rare dermatitis with Dr. Pearline Cables but state he stopped going because the rash does not bother him at all   HTN- he states he has taken his BP meds this AM, he was asking for a water pill today He is not prescribed any diuretic, states he doesn't have any fluid but wanted to have so he can regulate just in case No chest pain, no SOB  He has not had any abnormal bleeding with eliquis   Review Of Systems:  GEN- denies fatigue, fever, weight loss,weakness, recent illness HEENT- denies eye drainage, change in vision, nasal discharge, CVS- denies chest pain, palpitations RESP- denies SOB, cough, wheeze ABD- denies N/V, change in stools, abd pain GU- denies dysuria, hematuria, dribbling, incontinence MSK- denies joint pain, muscle aches, injury Neuro- denies headache, dizziness, syncope, seizure activity       Objective:    BP (!) 160/88   Pulse 64   Temp 98.7 F (37.1 C) (Temporal)   Resp 14   Ht 5\' 9"  (1.753 m)   Wt 198 lb (89.8 kg)   SpO2 96%   BMI 29.24 kg/m  GEN- NAD, alert and oriented x3 HEENT- PERRL, EOMI, non injected sclera, pink conjunctiva,  Neck- Supple, no thyromegaly, no JVD  CVS- RRR, no murmur RESP-CTAB ABD-NABS,soft,NT,ND EXT- No edema Pulses- Radial, DP- 2+    Declines flu shot     Assessment & Plan:      Problem List Items Addressed This Visit      Unprioritized   CHF (congestive heart failure) (HCC)    There is no fluid  overload Advised I would not recommend starting him on diuretics when they are not indicated He can also discuss further with his heart doctor       Cognitive decline    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       Coronary artery disease involving native coronary artery of native heart with unstable angina pectoris ([place])    Known CAD/CHF, state he is taking his meds Does not do well with high doses of BP meds due to dizzy spells He will monitor BP at home, expect on average it is above  140, but he states it is normal at home Recommend he take all his meds prescribed He declines statin drug      History of CVA (cerebrovascular accident) without residual deficits    He continues on eliquis      Hypercholesteremia   Relevant Orders   Lipid panel   Hypertension, benign - Primary   Relevant Orders   CBC with Differential/Platelet   Comprehensive metabolic panel   Lipid panel   Subclinical hypothyroidism   Relevant Orders   TSH   T3, free   T4, free      Note: This dictation was prepared with Dragon dictation along with smaller phrase technology. Any transcriptional errors that result from this process  are unintentional.

## 2020-06-26 NOTE — Assessment & Plan Note (Signed)
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

## 2020-06-26 NOTE — Patient Instructions (Signed)
F/U 6 months for Physical Keep check on blood pressure goal < 140/90 We will call with results

## 2020-06-26 NOTE — Assessment & Plan Note (Signed)
He continues on eliquis

## 2020-06-27 LAB — CBC WITH DIFFERENTIAL/PLATELET
Absolute Monocytes: 456 cells/uL (ref 200–950)
Basophils Absolute: 29 cells/uL (ref 0–200)
Basophils Relative: 0.6 %
Eosinophils Absolute: 110 cells/uL (ref 15–500)
Eosinophils Relative: 2.3 %
HCT: 43.9 % (ref 38.5–50.0)
Hemoglobin: 14.7 g/dL (ref 13.2–17.1)
Lymphs Abs: 1109 cells/uL (ref 850–3900)
MCH: 30.5 pg (ref 27.0–33.0)
MCHC: 33.5 g/dL (ref 32.0–36.0)
MCV: 91.1 fL (ref 80.0–100.0)
MPV: 11.6 fL (ref 7.5–12.5)
Monocytes Relative: 9.5 %
Neutro Abs: 3096 cells/uL (ref 1500–7800)
Neutrophils Relative %: 64.5 %
Platelets: 143 10*3/uL (ref 140–400)
RBC: 4.82 10*6/uL (ref 4.20–5.80)
RDW: 13.1 % (ref 11.0–15.0)
Total Lymphocyte: 23.1 %
WBC: 4.8 10*3/uL (ref 3.8–10.8)

## 2020-06-27 LAB — COMPREHENSIVE METABOLIC PANEL
AG Ratio: 1.7 (calc) (ref 1.0–2.5)
ALT: 12 U/L (ref 9–46)
AST: 15 U/L (ref 10–35)
Albumin: 4.1 g/dL (ref 3.6–5.1)
Alkaline phosphatase (APISO): 59 U/L (ref 35–144)
BUN: 17 mg/dL (ref 7–25)
CO2: 27 mmol/L (ref 20–32)
Calcium: 8.9 mg/dL (ref 8.6–10.3)
Chloride: 106 mmol/L (ref 98–110)
Creat: 1.06 mg/dL (ref 0.70–1.11)
Globulin: 2.4 g/dL (calc) (ref 1.9–3.7)
Glucose, Bld: 105 mg/dL — ABNORMAL HIGH (ref 65–99)
Potassium: 4.3 mmol/L (ref 3.5–5.3)
Sodium: 140 mmol/L (ref 135–146)
Total Bilirubin: 0.7 mg/dL (ref 0.2–1.2)
Total Protein: 6.5 g/dL (ref 6.1–8.1)

## 2020-06-27 LAB — LIPID PANEL
Cholesterol: 196 mg/dL (ref <200)
HDL: 46 mg/dL (ref >=40)
LDL Cholesterol (Calc): 127 mg/dL (calc) — ABNORMAL HIGH
Non-HDL Cholesterol (Calc): 150 mg/dL (calc) — ABNORMAL HIGH (ref <130)
Total CHOL/HDL Ratio: 4.3 (calc) (ref <5.0)
Triglycerides: 121 mg/dL (ref <150)

## 2020-06-27 LAB — T3, FREE: T3, Free: 3.3 pg/mL (ref 2.3–4.2)

## 2020-06-27 LAB — TSH: TSH: 4.11 mIU/L (ref 0.40–4.50)

## 2020-06-27 LAB — T4, FREE: Free T4: 1.2 ng/dL (ref 0.8–1.8)

## 2020-06-30 ENCOUNTER — Other Ambulatory Visit: Payer: Self-pay | Admitting: Cardiovascular Disease

## 2020-07-03 ENCOUNTER — Encounter: Payer: Self-pay | Admitting: *Deleted

## 2020-08-06 DIAGNOSIS — H353132 Nonexudative age-related macular degeneration, bilateral, intermediate dry stage: Secondary | ICD-10-CM | POA: Diagnosis not present

## 2020-08-06 DIAGNOSIS — H0100A Unspecified blepharitis right eye, upper and lower eyelids: Secondary | ICD-10-CM | POA: Diagnosis not present

## 2020-08-06 DIAGNOSIS — C441021 Unspecified malignant neoplasm of skin of right upper eyelid, including canthus: Secondary | ICD-10-CM | POA: Diagnosis not present

## 2020-08-06 DIAGNOSIS — H40013 Open angle with borderline findings, low risk, bilateral: Secondary | ICD-10-CM | POA: Diagnosis not present

## 2020-09-26 ENCOUNTER — Other Ambulatory Visit: Payer: Self-pay | Admitting: Cardiovascular Disease

## 2020-10-17 ENCOUNTER — Encounter: Payer: PPO | Admitting: Family Medicine

## 2020-11-04 NOTE — Progress Notes (Signed)
Chief Complaint  Patient presents with  . Follow-up    CAD    History of Present Illness: 87 yo male with history of CAD, PVCs, HLD, HTN, mitral regurgitation and CVA who is here today for follow up. He hadbeen followed by Dr. Gwenlyn Found and switched to my office in 2018.I performed his cardiac cath in May 2017. His cardiac cath showed a severe mid LAD stenosis treated with a drug eluting stent and a severe stenosis in the obtuse marginal artery treated with a drug eluting stent. Echo May 2017 with LVEF=30-35% prior to his PCI. After the [facility] in August 2017 showed improvement in LVEF to 40-45%. Also noted to have mild mitral valve regurgitation. He is known to have PVCs. He has stopped his Plavix due to easy bruising.His Coreg was stopped due to bradycardia. He was seen in our office February 2019 by Lyda Jester, PA for PVCs. Cardiac monitor February 2019 with frequent PVCs. His Coreg was restarted. Echo showed reduced LVEF=35-40% felt to be tachy mediated. He had a stroke in May 2019. Echo May 2019 with LVEF=30-35%. Repeat limited echo with contrast 01/25/18 with LVEF estimated at 20-25% but no thrombus identified. TEE on 01/26/18 with LVEF=20-25% with trivial AI, mild to moderate MR. No intracardiac thrombus noted. He was discharged home on Eliquis due to his low EF and potential to form LV thrombus. He was also discharged on amiodarone. He was seen in our EP clinic by Dr. Lennie Odor. Latest Echo 05/12/18 with LVEF=20-25%. He did not tolerate amiodarone due to bad dreams and this was stopped. Dr. Lennie Odor started mexiletine at the office visit 05/19/18 but he did not tolerate this due to weakness and fatigue. Carotid artery dopplers March 2021 with no obstructive disease.   He is here today for follow up. The patient denies any chest pain, dyspnea, palpitations, lower extremity edema, orthopnea, PND, dizziness, near syncope or syncope.     Primary Care Physician: [name], MD  Past  Medical History:  Diagnosis Date  . Angina decubitus ([place])   . BPH with elevated PSA 01/06/2017   Has seen urology in the past, declines any treatment  . Bradycardia 04/20/2017  . CAD (coronary artery disease)   . Cerebellar infarct ([place])   . Cerebral infarction involving cerebellar artery ([place]) 01/26/2018  . CHF (congestive heart failure) ([place])   . Coronary artery disease involving native coronary artery of native heart with unstable angina pectoris ([place])   . Frequent unifocal PVCs    Palpitations since early 20s, PVCs and pairs  . History of CVA (cerebrovascular accident) without residual deficits 12/20/2010   CVA Nov 2007   . Hypercholesteremia 01/17/2011   Currently taking red yeast rice extract   . Hyperlipidemia   . Hypertension   . Hypertension, benign 12/20/2010   hypertension   . Ischemic cardiomyopathy   . Palpitations 12/25/2015   palpitations   . Prediabetes   . PVC's (premature ventricular contractions) 01/23/2016  . Stroke [facility]) 2009  . Subclinical hypothyroidism 12/21/2015    Past Surgical History:  Procedure Laterality Date  . CARDIAC CATHETERIZATION N/A 01/23/2016   Procedure: Right/Left Heart Cath and Coronary Angiography;  Surgeon: Burnell Blanks, MD;  Location: [place] CV LAB;  Service: Cardiovascular;  Laterality: N/A;  . CARDIAC CATHETERIZATION N/A 01/23/2016   Procedure: Coronary Stent Intervention;  Surgeon: Burnell Blanks, MD;  Location: [place] CV LAB;  Service: Cardiovascular;  Laterality: N/A;  . HERNIA REPAIR  06/1992  . TEE WITHOUT  CARDIOVERSION N/A 01/26/2018   Procedure: TRANSESOPHAGEAL ECHOCARDIOGRAM (TEE);  Surgeon: Jolaine Artist, MD;  Location: [facility] ENDOSCOPY;  Service: Cardiovascular;  Laterality: N/A;    Current Outpatient Medications  Medication Sig Dispense Refill  . Ascorbic Acid (VITAMIN C PO) Take 250 mg by mouth daily.     Marland Kitchen aspirin 81 MG tablet Take 81 mg by mouth daily.     . carvedilol (COREG) 3.125 MG tablet TAKE 1  TABLET 2 TIMES DAILY WITH A MEAL. PLEASE KEEP UPCOMING APPT IN FEBRUARY WITH DR. Angelena Form 180 tablet 0  . Cholecalciferol (VITAMIN D PO) Take 2,000 Units by mouth 2 (two) times a week.     . Coenzyme Q10 (COQ10 PO) Take 50 mg by mouth.    Arne Cleveland 5 MG TABS tablet TAKE 1 TABLET BY MOUTH TWICE A DAY 180 tablet 1  . furosemide (LASIX) 20 MG tablet Take 1 tablet (20 mg total) by mouth daily as needed. 30 tablet 3  . losartan (COZAAR) 25 MG tablet TAKE 1/2 TABLET EVERY DAY 45 tablet 0  . Multiple Vitamin (MULTIVITAMIN) tablet Take 1 tablet by mouth daily.     No current facility-administered medications for this visit.    No Known Allergies  Social History   Socioeconomic History  . Marital status: Married    Spouse name: Not on file  . Number of children: Not on file  . Years of education: Not on file  . Highest education level: Not on file  Occupational History  . Not on file  Tobacco Use  . Smoking status: Never Smoker  . Smokeless tobacco: Never Used  Vaping Use  . Vaping Use: Never used  Substance and Sexual Activity  . Alcohol use: No  . Drug use: No  . Sexual activity: Not Currently  Other Topics Concern  . Not on file  Social History Narrative  . Not on file   Social Determinants of Health   Financial Resource Strain: Not on file  Food Insecurity: Not on file  Transportation Needs: Not on file  Physical Activity: Not on file  Stress: Not on file  Social Connections: Not on file  Intimate Partner Violence: Not on file    Family History  Problem Relation Age of Onset  . Stroke Mother   . Early death Mother   . Cancer Brother     Review of Systems:  As stated in the HPI and otherwise negative.   BP (!) 154/78   Pulse 86   Ht 5\' 7"  (1.702 m)   Wt 205 lb (93 kg)   SpO2 98%   BMI 32.11 kg/m   Physical Examination: General: Well developed, well nourished, NAD  HEENT: OP clear, mucus membranes moist  SKIN: warm, dry. No rashes. Neuro: No focal  deficits  Musculoskeletal: Muscle strength 5/5 all ext  Psychiatric: Mood and affect normal  Neck: No JVD, no carotid bruits, no thyromegaly, no lymphadenopathy.  Lungs:Clear bilaterally, no wheezes, rhonci, crackles Cardiovascular: Regular rate and rhythm. No murmurs, gallops or rubs. Abdomen:Soft. Bowel sounds present. Non-tender.  Extremities: No lower extremity edema. Pulses are 2 + in the bilateral DP/PT.  Echo 05/12/18: Left ventricle: The cavity size was mildly dilated. Wall   thickness was increased in a pattern of mild LVH. Systolic   function was severely reduced. The estimated ejection fraction   was in the range of 20% to 25%. Diffuse hypokinesis. Akinesis of   the apical myocardium. Features are consistent with a   pseudonormal left  ventricular filling pattern, with concomitant   abnormal relaxation and increased filling pressure (grade 2   diastolic dysfunction). Doppler parameters are consistent with   high ventricular filling pressure. - Aortic valve: Transvalvular velocity was within the normal range.   There was no stenosis. There was mild regurgitation. - Mitral valve: Transvalvular velocity was within the normal range.   There was no evidence for stenosis. There was mild regurgitation. - Left atrium: The atrium was severely dilated. - Right ventricle: The cavity size was normal. Wall thickness was   normal. Systolic function was normal. - Tricuspid valve: There was mild-moderate regurgitation. - Pulmonary arteries: Systolic pressure was severely increased. PA   peak pressure: 60 mm Hg (S).  EKG:  EKG is not ordered today. The ekg ordered today demonstrates sinus, PVCs-bigeminy  Recent Labs: 06/26/2020: ALT 12; BUN 17; Creat 1.06; Hemoglobin 14.7; Platelets 143; Potassium 4.3; Sodium 140; TSH 4.11   Lipid Panel    Component Value Date/Time   CHOL 196 06/26/2020 0818   CHOL 197 11/29/2019 0928   TRIG 121 06/26/2020 0818   HDL 46 06/26/2020 0818   HDL 51  11/29/2019 0928   CHOLHDL 4.3 06/26/2020 0818   VLDL 13 01/22/2018 0720   LDLCALC 127 (H) 06/26/2020 0818     Wt Readings from Last 3 Encounters:  11/05/20 205 lb (93 kg)  06/26/20 198 lb (89.8 kg)  01/11/20 202 lb (91.6 kg)     Other studies Reviewed: Additional studies/ records that were reviewed today include: . Review of the above records demonstrates:   Assessment and Plan:   1. CAD without angina: No chest pain. Continue ASA and beta blocker. He does not tolerate statins.   2. HTN: BP is controlled at home. He reports white coat HTN. No changes  3. Hyperlipidemia: He does not tolerate statins. He does not wish to consider alternatives to statins such as Repatha or Praluent.   4. Ischemic cardiomyopathy/Chronic systolic CHF: Weight is stable without volume overload on exam. He has not  tolerated high doses of beta blockers due to bradycardia. Will continue the beta blocker, ARB and Lasix.  He was seen in the EP clinic by Dr. Lennie Odor and ICD was not recommended. Discussion around the possibility of the PVCs causing contributing to his LV systolic dysfunction. He did not tolerate amiodarone or mexilitine. He tells me that he does not wish to try any other medications. He feels well and does not wish to pursue other testing. Will start lasix 20 mg prn.   5. PVCs: He has been seen in the EP clinic. He did not tolerate amiodarone or mexilitine. He is tolerating Coreg. He has no palpitations. He does not wish to pursue more testing at this time. He does not wish to go back to the EP clinic.   6. Prior stroke: Continue Eliquis per neurology  Current medicines are reviewed at length with the patient today.  The patient does not have concerns regarding medicines.  The following changes have been made:  no change  Labs/ tests ordered today include:   Orders Placed This Encounter  Procedures  . EKG 12-Lead     Disposition:   FU with me in 6  months   Signed, Lauree Chandler, MD 11/05/2020 10:46 AM    [facility] [place], [place], [place]  [address] Phone: [phone]; Fax: [phone]

## 2020-11-05 ENCOUNTER — Encounter: Payer: Self-pay | Admitting: Cardiovascular Disease

## 2020-11-05 ENCOUNTER — Ambulatory Visit: Payer: PPO | Admitting: Cardiovascular Disease

## 2020-11-05 ENCOUNTER — Other Ambulatory Visit: Payer: Self-pay

## 2020-11-05 VITALS — BP 154/78 | HR 86 | Ht 67.0 in | Wt 205.0 lb

## 2020-11-05 DIAGNOSIS — I255 Ischemic cardiomyopathy: Secondary | ICD-10-CM | POA: Diagnosis not present

## 2020-11-05 DIAGNOSIS — E782 Mixed hyperlipidemia: Secondary | ICD-10-CM

## 2020-11-05 DIAGNOSIS — I493 Ventricular premature depolarization: Secondary | ICD-10-CM | POA: Diagnosis not present

## 2020-11-05 DIAGNOSIS — I10 Essential (primary) hypertension: Secondary | ICD-10-CM | POA: Diagnosis not present

## 2020-11-05 DIAGNOSIS — I2510 Atherosclerotic heart disease of native coronary artery without angina pectoris: Secondary | ICD-10-CM | POA: Diagnosis not present

## 2020-11-05 DIAGNOSIS — I5042 Chronic combined systolic (congestive) and diastolic (congestive) heart failure: Secondary | ICD-10-CM

## 2020-11-05 MED ORDER — FUROSEMIDE 20 MG PO TABS: 20.0000 mg | ORAL_TABLET | Freq: Every day | ORAL | 3 refills | Status: DC | PRN

## 2020-11-05 NOTE — Patient Instructions (Signed)
Medication Instructions:  Your physician has recommended you make the following change in your medication:  1.) furosemide (Lasix) 20 mg - take one tablet daily as needed for swelling  *If you need a refill on your cardiac medications before your next appointment, please call your pharmacy*   Lab Work: none If you have labs (blood work) drawn today and your tests are completely normal, you will receive your results only by: Marland Kitchen MyChart Message (if you have MyChart) OR . A paper copy in the mail If you have any lab test that is abnormal or we need to change your treatment, we will call you to review the results.   Testing/Procedures: none   Follow-Up: At [facility], you and your health needs are our priority.  As part of our continuing mission to provide you with exceptional heart care, we have created designated Provider Care Teams.  These Care Teams include your primary Cardiologist (physician) and Advanced Practice Providers (APPs -  Physician Assistants and Nurse Practitioners) who all work together to provide you with the care you need, when you need it.    Your next appointment:   12 month(s)  The format for your next appointment:   In Person  Provider:   You may see Lauree Chandler, MD or one of the following Advanced Practice Providers on your designated Care Team:    Melina Copa, PA-C  Ermalinda Barrios, PA-C    Other Instructions

## 2020-11-07 ENCOUNTER — Encounter: Payer: PPO | Admitting: Family Medicine

## 2020-11-21 ENCOUNTER — Other Ambulatory Visit: Payer: PPO

## 2020-11-21 ENCOUNTER — Other Ambulatory Visit: Payer: Self-pay

## 2020-11-21 DIAGNOSIS — I10 Essential (primary) hypertension: Secondary | ICD-10-CM

## 2020-11-21 DIAGNOSIS — E038 Other specified hypothyroidism: Secondary | ICD-10-CM | POA: Diagnosis not present

## 2020-11-21 DIAGNOSIS — Z8673 Personal history of transient ischemic attack (TIA), and cerebral infarction without residual deficits: Secondary | ICD-10-CM

## 2020-11-21 DIAGNOSIS — I5022 Chronic systolic (congestive) heart failure: Secondary | ICD-10-CM

## 2020-11-21 DIAGNOSIS — I25110 Atherosclerotic heart disease of native coronary artery with unstable angina pectoris: Secondary | ICD-10-CM | POA: Diagnosis not present

## 2020-11-21 DIAGNOSIS — E7800 Pure hypercholesterolemia, unspecified: Secondary | ICD-10-CM | POA: Diagnosis not present

## 2020-11-22 LAB — CBC WITH DIFFERENTIAL/PLATELET
Absolute Monocytes: 387 cells/uL (ref 200–950)
Basophils Absolute: 31 cells/uL (ref 0–200)
Basophils Relative: 0.7 %
Eosinophils Absolute: 70 cells/uL (ref 15–500)
Eosinophils Relative: 1.6 %
HCT: 43.6 % (ref 38.5–50.0)
Hemoglobin: 14.6 g/dL (ref 13.2–17.1)
Lymphs Abs: 1250 cells/uL (ref 850–3900)
MCH: 30.9 pg (ref 27.0–33.0)
MCHC: 33.5 g/dL (ref 32.0–36.0)
MCV: 92.4 fL (ref 80.0–100.0)
MPV: 12.1 fL (ref 7.5–12.5)
Monocytes Relative: 8.8 %
Neutro Abs: 2662 cells/uL (ref 1500–7800)
Neutrophils Relative %: 60.5 %
Platelets: 149 10*3/uL (ref 140–400)
RBC: 4.72 10*6/uL (ref 4.20–5.80)
RDW: 13.1 % (ref 11.0–15.0)
Total Lymphocyte: 28.4 %
WBC: 4.4 10*3/uL (ref 3.8–10.8)

## 2020-11-22 LAB — COMPLETE METABOLIC PANEL WITH GFR
AG Ratio: 2.1 (calc) (ref 1.0–2.5)
ALT: 14 U/L (ref 9–46)
AST: 19 U/L (ref 10–35)
Albumin: 4.1 g/dL (ref 3.6–5.1)
Alkaline phosphatase (APISO): 56 U/L (ref 35–144)
BUN: 17 mg/dL (ref 7–25)
CO2: 27 mmol/L (ref 20–32)
Calcium: 8.9 mg/dL (ref 8.6–10.3)
Chloride: 105 mmol/L (ref 98–110)
Creat: 1.07 mg/dL (ref 0.70–1.11)
GFR, Est African American: 72 mL/min/{1.73_m2} (ref >=60)
GFR, Est Non African American: 62 mL/min/{1.73_m2} (ref >=60)
Globulin: 2 g/dL (calc) (ref 1.9–3.7)
Glucose, Bld: 97 mg/dL (ref 65–99)
Potassium: 4.6 mmol/L (ref 3.5–5.3)
Sodium: 140 mmol/L (ref 135–146)
Total Bilirubin: 0.8 mg/dL (ref 0.2–1.2)
Total Protein: 6.1 g/dL (ref 6.1–8.1)

## 2020-11-22 LAB — LIPID PANEL
Cholesterol: 178 mg/dL (ref <200)
HDL: 46 mg/dL (ref >=40)
LDL Cholesterol (Calc): 110 mg/dL (calc) — ABNORMAL HIGH
Non-HDL Cholesterol (Calc): 132 mg/dL (calc) — ABNORMAL HIGH (ref <130)
Total CHOL/HDL Ratio: 3.9 (calc) (ref <5.0)
Triglycerides: 112 mg/dL (ref <150)

## 2020-11-22 LAB — TSH: TSH: 3.19 mIU/L (ref 0.40–4.50)

## 2020-11-28 ENCOUNTER — Other Ambulatory Visit: Payer: Self-pay

## 2020-11-28 ENCOUNTER — Ambulatory Visit (INDEPENDENT_AMBULATORY_CARE_PROVIDER_SITE_OTHER): Payer: PPO | Admitting: Family Medicine

## 2020-11-28 ENCOUNTER — Encounter: Payer: Self-pay | Admitting: Family Medicine

## 2020-11-28 VITALS — BP 138/70 | HR 88 | Temp 97.8°F | Resp 14 | Ht 67.0 in | Wt 205.0 lb

## 2020-11-28 DIAGNOSIS — I25110 Atherosclerotic heart disease of native coronary artery with unstable angina pectoris: Secondary | ICD-10-CM

## 2020-11-28 DIAGNOSIS — H9193 Unspecified hearing loss, bilateral: Secondary | ICD-10-CM

## 2020-11-28 DIAGNOSIS — Z8673 Personal history of transient ischemic attack (TIA), and cerebral infarction without residual deficits: Secondary | ICD-10-CM

## 2020-11-28 DIAGNOSIS — I10 Essential (primary) hypertension: Secondary | ICD-10-CM

## 2020-11-28 DIAGNOSIS — I5022 Chronic systolic (congestive) heart failure: Secondary | ICD-10-CM | POA: Diagnosis not present

## 2020-11-28 DIAGNOSIS — Z0001 Encounter for general adult medical examination with abnormal findings: Secondary | ICD-10-CM

## 2020-11-28 DIAGNOSIS — Z0000 Encounter for general adult medical examination without abnormal findings: Secondary | ICD-10-CM

## 2020-11-28 MED ORDER — B COMPLEX VITAMINS PO CAPS: 1.0000 | ORAL_CAPSULE | Freq: Every day | ORAL | Status: AC

## 2020-11-28 MED ORDER — CARVEDILOL 3.125 MG PO TABS: 3.1250 mg | ORAL_TABLET | Freq: Two times a day (BID) | ORAL | 1 refills | Status: DC

## 2020-11-28 MED ORDER — LOSARTAN POTASSIUM 25 MG PO TABS: 12.5000 mg | ORAL_TABLET | Freq: Every day | ORAL | 1 refills | Status: DC

## 2020-11-28 MED ORDER — APIXABAN 5 MG PO TABS: 5.0000 mg | ORAL_TABLET | Freq: Two times a day (BID) | ORAL | 1 refills | Status: DC

## 2020-11-28 NOTE — Assessment & Plan Note (Signed)
Declines statin drug LDL has improved Followed by cardiology He is consistent with eliquis, Beta blocker, losartan now

## 2020-11-28 NOTE — Patient Instructions (Signed)
[facility] 

## 2020-11-28 NOTE — Assessment & Plan Note (Signed)
No fluid overload  Prn lasix

## 2020-11-28 NOTE — Progress Notes (Signed)
Subjective:   Patient presents for Medicare Annual/Subsequent preventive examination.   Patient here for Medicare wellness visit.  Medications reviewed.  Recent fasting labs reviewed at the bedside.  No concerns, states he feels well  CAD/CHF/PVC- reviewed recent cardioliogy note, no changes to meds, pt declines further interventions  He was seen by neurology due to history of Stroke and memory changes, given statin but he declines taking  He has been worked up for a rare dermatitis ? Cutaneous leukemia with Dr. Pearline Cables but state he stopped going because the rash does not bother him at all   HTN- he states he has taken his BP meds this AM,    No SOB, no Chest pain     He has not had any abnormal bleeding with eliquis      Risk Factors  Current exercise habits: Stays active walks Dietary issues discussed: No major concerns  Cardiac risk factors: Obesity (BMI >= 30 kg/m2).  Stroke, heart failure   Depression Screen  (Note: if answer to either of the following is "Yes", a more complete depression screening is indicated)  Over the past two weeks, have you felt down, depressed or hopeless? No Over the past two weeks, have you felt little interest or pleasure in doing things? No Have you lost interest or pleasure in daily life? No Do you often feel hopeless? No Do you cry easily over simple problems? No   Activities of Daily Living  In your present state of health, do you have any difficulty performing the following activities?:  Driving? No  Managing money? No  Feeding yourself? No  Getting from bed to chair? No  Climbing a flight of stairs? No  Preparing food and eating?: No  Bathing or showering? No  Getting dressed: No  Getting to the toilet? No  Using the toilet:No  Moving around from place to place: No  In the past year have you fallen or had a near fall?:No   Hearing Difficulties:   YES Do you often ask people to speak up or repeat themselves? Yes Do you  experience ringing or noises in your ears? YES Do you have difficulty understanding soft or whispered voices? No  Do you feel that you have a problem with memory?yes Do you often misplace items? No  Do you feel safe at home? Yes  Cognitive Testing  Alert? Yes Normal Appearance?Yes  Oriented to person? Yes Place? Yes  Time? Yes  Recall of three objects? Yes  Can perform simple calculations? Yes  Displays appropriate judgment?Yes  Can read the correct time from a watch face?Yes   List the Names of Other Physician/Practitioners you currently use:  Cardiology, nephrology Ophthalmology -Dr. Eulas Post Neurology Dermatology    Screening Tests / Date declined ALL Colonoscopy                     Zostavax  pneumonia  Influenza Vaccine  Tetanus/tdap  ROS: GEN- denies fatigue, fever, weight loss,weakness, recent illness HEENT- denies eye drainage, change in vision, nasal discharge, CVS- denies chest pain, palpitations RESP- denies SOB, cough, wheeze ABD- denies N/V, change in stools, abd pain GU- denies dysuria, hematuria, dribbling, incontinence MSK- denies joint pain, muscle aches, injury Neuro- denies headache, dizziness, syncope, seizure activity  Physical: GEN- NAD, alert and oriented x3 HEENT- PERRL, EOMI, non injected sclera, pink conjunctiva, MMM, oropharynx clear Neck- Supple, no thryomegaly CVS- RRR, no murmur RESP-CTAB ABD-NABS,Soft,NT,ND  EXT- No edema Pulses- Radial, DP- 2+   Assessment:  Annual wellness medicare exam   Plan:    During the course of the visit the patient was educated and counseled about appropriate screening and preventive services including:   CAGE/depression/fall screening negative  History of stroke discussed the importance of his medications.  He is can continue with the Eliquis.  no abnormal bleeding  CAD/HTN- followed by cardiology , taking meds more consistently bp controlled   He declines any immunizations or  preventative work-up he is over age as far as colonoscopy.  Known BPH with elevated PSA he declines following this further or treatment  Reviewed his recent labs  Declines intervention for hearing, does not want hearing aides             Has advanced directives      Diet review for nutrition referral? Yes ____ Not Indicated __x__  Patient Instructions (the written plan) was given to the patient.  Medicare Attestation  I have personally reviewed:  The patient's medical and social history  Their use of alcohol, tobacco or illicit drugs  Their current medications and supplements  The patient's functional ability including ADLs,fall risks, home safety risks, cognitive, and hearing and visual impairment  Diet and physical activities  Evidence for depression or mood disorders  The patient's weight, height, BMI, and visual acuity have been recorded in the chart. I have made referrals, counseling, and provided education to the patient based on review of the above and I have provided the patient with a written personalized care plan for preventive services.

## 2020-11-28 NOTE — Assessment & Plan Note (Signed)
On Eliquis Has some memory changes He stays active Feels well Reviewed labs at bedside

## 2021-02-02 ENCOUNTER — Other Ambulatory Visit: Payer: Self-pay | Admitting: Cardiovascular Disease

## 2021-06-26 NOTE — Progress Notes (Signed)
Subjective:    Patient ID: Patrick Maxwell, male    DOB: 17-Jun-1931, 87 y.o.   MRN: [id number]  HPI: Patrick Maxwell is a 87 y.o. male presenting for follow up.  Chief Complaint  Patient presents with   Follow-up   Patient tells me he feels great today.  He has no complaints.   CORONARY ARTERY DISEASE Also has cardiomyopathy.  Follows with Cardiology - has not been having chest pain.  Maintains on aspirin daily and carvedilol 3.125 mg twice daily with meals.  He also takes Lasix 20 mg daily.  Angina frequency: never Chest pain: no Edema: no Orthopnea: no Paroxysmal nocturnal dyspnea: no Dyspnea on exertion: no Aspirin: yes  History of stroke/PE - Has maintained on Eliquis 5 mg twice daily.  Denies any bleeding today.  No easy bruising.   per Neurology  HYPERTENSION Currently taking losartan 12.5 mg daily, carvedilol 3.125 mg twice daily with meals.  Taking furosemide 20 mg daily as well.  Tells me he has white coat hypertension. Hypertension status: stable  BP monitoring frequency:  daily BP range: 132/70 Medication compliance: excellent Aspirin: yes Recurrent headaches: no Visual changes: no Palpitations: no Dyspnea: no Chest pain: no Lower extremity edema: no Dizzy/lightheaded: no  No Known Allergies  Outpatient Encounter Medications as of 06/27/2021  Medication Sig   apixaban (ELIQUIS) 5 MG TABS tablet Take 1 tablet (5 mg total) by mouth 2 (two) times daily.   Ascorbic Acid (VITAMIN C PO) Take 250 mg by mouth daily.    aspirin 81 MG tablet Take 81 mg by mouth daily.    b complex vitamins capsule Take 1 capsule by mouth daily.   carvedilol (COREG) 3.125 MG tablet Take 1 tablet (3.125 mg total) by mouth 2 (two) times daily with a meal.   Cholecalciferol (VITAMIN D PO) Take 2,000 Units by mouth 2 (two) times a week.    Coenzyme Q10 (COQ10 PO) Take 50 mg by mouth.   furosemide (LASIX) 20 MG tablet TAKE 1 TABLET BY MOUTH EVERY DAY AS NEEDED   losartan (COZAAR)  25 MG tablet Take 0.5 tablets (12.5 mg total) by mouth daily.   Multiple Vitamin (MULTIVITAMIN) tablet Take 1 tablet by mouth daily.   No facility-administered encounter medications on file as of 06/27/2021.    Patient Active Problem List   Diagnosis Date Noted   Cognitive decline 06/26/2020   Ischemic cardiomyopathy    Cerebellar infarct (HCC)    Bradycardia 04/20/2017   BPH with elevated PSA 01/06/2017   PVC's (premature ventricular contractions) 01/23/2016   Coronary artery disease involving native coronary artery of native heart with unstable angina pectoris (HCC)    Angina decubitus (HCC)    CHF (congestive heart failure) ([place]) 01/21/2016   Subclinical hypothyroidism 12/21/2015   Hypercholesteremia 01/17/2011   History of CVA (cerebrovascular accident) without residual deficits 12/20/2010   Hypertension, benign 12/20/2010    Past Medical History:  Diagnosis Date   Angina decubitus ([place])    BPH with elevated PSA 01/06/2017   Has seen urology in the past, declines any treatment   Bradycardia 04/20/2017   CAD (coronary artery disease)    Cerebellar infarct [place])    Cerebral infarction involving cerebellar artery ([place]) 01/26/2018   CHF (congestive heart failure) ([place])    Coronary artery disease involving native coronary artery of native heart with unstable angina pectoris (HCC)    Frequent unifocal PVCs    Palpitations since early 20s, PVCs and pairs   History  of CVA (cerebrovascular accident) without residual deficits 12/20/2010   CVA Nov 2007    Hypercholesteremia 01/17/2011   Currently taking red yeast rice extract    Hyperlipidemia    Hypertension    Hypertension, benign 12/20/2010   hypertension    Ischemic cardiomyopathy    Palpitations 12/25/2015   palpitations    Prediabetes    PVC's (premature ventricular contractions) 01/23/2016   Stroke ([place]) 2009   Subclinical hypothyroidism 12/21/2015    Relevant past medical, surgical, family and social history reviewed and  updated as indicated. Interim medical history since our last visit reviewed.  Review of Systems Per HPI unless specifically indicated above     Objective:    BP (!) 170/92   Pulse 78   Temp 98 F (36.7 C) (Oral)   Ht 5\' 7"  (1.702 m)   Wt 199 lb 12.8 oz (90.6 kg)   SpO2 97%   BMI 31.29 kg/m   Wt Readings from Last 3 Encounters:  06/27/21 199 lb 12.8 oz (90.6 kg)  11/28/20 205 lb (93 kg)  11/05/20 205 lb (93 kg)    Physical Exam Vitals and nursing note reviewed.  Constitutional:      General: He is not in acute distress.    Appearance: Normal appearance. He is not toxic-appearing.  HENT:     Head: Normocephalic and atraumatic.  Eyes:     General: No scleral icterus.    Extraocular Movements: Extraocular movements intact.  Neck:     Vascular: No carotid bruit.  Cardiovascular:     Rate and Rhythm: Normal rate and regular rhythm.     Heart sounds: Normal heart sounds. No murmur heard. Pulmonary:     Effort: No respiratory distress.     Breath sounds: Normal breath sounds. No wheezing, rhonchi or rales.  Musculoskeletal:     Cervical back: Normal range of motion.     Right lower leg: No edema.     Left lower leg: No edema.  Skin:    General: Skin is warm and dry.     Coloration: Skin is not jaundiced or pale.     Findings: No erythema.  Neurological:     Mental Status: He is alert and oriented to person, place, and time.  Psychiatric:        Mood and Affect: Mood normal.        Behavior: Behavior normal.        Thought Content: Thought content normal.        Judgment: Judgment normal.      Assessment & Plan:   Problem List Items Addressed This Visit       Cardiovascular and Mediastinum   Hypertension, benign - Primary    Chronic.  BP significantly elevated today in office, however reports white coat hypertension, asymptomatic, and normal BP reading at home.  Will check kidney function with electrolytes today and continue current medications including  losartan 12.5 mg daily, carvedilol 3.125 mg twice daily with meals.  Follow up in 6 months for wellness or sooner with any changes.       Relevant Orders   BASIC METABOLIC PANEL WITH GFR   Lipid panel   CBC with Differential   Coronary artery disease involving native coronary artery of native heart with unstable angina pectoris (HCC)    Chronic.  Patient continues to declined statin drug.  He does take baby aspirin daily and Eliquis 5 mg daily.  He follows closely with Cardiology - continue collaboration.  Relevant Orders   Lipid panel     Other   Hypercholesteremia    Chronic.  Will check lipids today, patient is fasting.  Patient declines starting statin at this time.  Continue with OTC supplement and dietary/lifestyle modifications.  Follow up in 6 months for wellness.         Patient declines the flu shot today although I did recommend this.  Follow up plan: Return in about 6 months (around 12/26/2021) for wellness visit.

## 2021-06-27 ENCOUNTER — Other Ambulatory Visit: Payer: Self-pay

## 2021-06-27 ENCOUNTER — Other Ambulatory Visit: Payer: Self-pay | Admitting: *Deleted

## 2021-06-27 ENCOUNTER — Ambulatory Visit (INDEPENDENT_AMBULATORY_CARE_PROVIDER_SITE_OTHER): Payer: PPO | Admitting: Nurse Practitioner

## 2021-06-27 ENCOUNTER — Encounter: Payer: Self-pay | Admitting: Nurse Practitioner

## 2021-06-27 VITALS — BP 170/92 | HR 78 | Temp 98.0°F | Ht 67.0 in | Wt 199.8 lb

## 2021-06-27 DIAGNOSIS — I10 Essential (primary) hypertension: Secondary | ICD-10-CM

## 2021-06-27 DIAGNOSIS — E7800 Pure hypercholesterolemia, unspecified: Secondary | ICD-10-CM | POA: Diagnosis not present

## 2021-06-27 DIAGNOSIS — I25110 Atherosclerotic heart disease of native coronary artery with unstable angina pectoris: Secondary | ICD-10-CM

## 2021-06-27 MED ORDER — LOSARTAN POTASSIUM 25 MG PO TABS: 12.5000 mg | ORAL_TABLET | Freq: Every day | ORAL | 1 refills | Status: DC

## 2021-06-27 MED ORDER — APIXABAN 5 MG PO TABS: 5.0000 mg | ORAL_TABLET | Freq: Two times a day (BID) | ORAL | 1 refills | Status: DC

## 2021-06-27 MED ORDER — CARVEDILOL 3.125 MG PO TABS: 3.1250 mg | ORAL_TABLET | Freq: Two times a day (BID) | ORAL | 1 refills | Status: DC

## 2021-06-27 NOTE — Assessment & Plan Note (Signed)
Chronic.  BP significantly elevated today in office, however reports white coat hypertension, asymptomatic, and normal BP reading at home.  Will check kidney function with electrolytes today and continue current medications including losartan 12.5 mg daily, carvedilol 3.125 mg twice daily with meals.  Follow up in 6 months for wellness or sooner with any changes.

## 2021-06-27 NOTE — Assessment & Plan Note (Addendum)
Chronic.  Will check lipids today, patient is fasting.  Patient declines starting statin at this time.  Continue with OTC supplement and dietary/lifestyle modifications.  Follow up in 6 months for wellness.

## 2021-06-27 NOTE — Assessment & Plan Note (Signed)
Chronic.  Patient continues to declined statin drug.  He does take baby aspirin daily and Eliquis 5 mg daily.  He follows closely with Cardiology - continue collaboration.

## 2021-06-28 LAB — CBC WITH DIFFERENTIAL/PLATELET
Absolute Monocytes: 361 cells/uL (ref 200–950)
Basophils Absolute: 22 cells/uL (ref 0–200)
Basophils Relative: 0.5 %
Eosinophils Absolute: 90 cells/uL (ref 15–500)
Eosinophils Relative: 2.1 %
HCT: 44.2 % (ref 38.5–50.0)
Hemoglobin: 15 g/dL (ref 13.2–17.1)
Lymphs Abs: 1170 cells/uL (ref 850–3900)
MCH: 31.2 pg (ref 27.0–33.0)
MCHC: 33.9 g/dL (ref 32.0–36.0)
MCV: 91.9 fL (ref 80.0–100.0)
MPV: 12 fL (ref 7.5–12.5)
Monocytes Relative: 8.4 %
Neutro Abs: 2657 cells/uL (ref 1500–7800)
Neutrophils Relative %: 61.8 %
Platelets: 140 10*3/uL (ref 140–400)
RBC: 4.81 10*6/uL (ref 4.20–5.80)
RDW: 13 % (ref 11.0–15.0)
Total Lymphocyte: 27.2 %
WBC: 4.3 10*3/uL (ref 3.8–10.8)

## 2021-06-28 LAB — LIPID PANEL
Cholesterol: 194 mg/dL (ref <200)
HDL: 50 mg/dL (ref >=40)
LDL Cholesterol (Calc): 123 mg/dL (calc) — ABNORMAL HIGH
Non-HDL Cholesterol (Calc): 144 mg/dL (calc) — ABNORMAL HIGH (ref <130)
Total CHOL/HDL Ratio: 3.9 (calc) (ref <5.0)
Triglycerides: 107 mg/dL (ref <150)

## 2021-06-28 LAB — BASIC METABOLIC PANEL WITH GFR
BUN: 17 mg/dL (ref 7–25)
CO2: 26 mmol/L (ref 20–32)
Calcium: 8.7 mg/dL (ref 8.6–10.3)
Chloride: 105 mmol/L (ref 98–110)
Creat: 1.06 mg/dL (ref 0.70–1.22)
Glucose, Bld: 95 mg/dL (ref 65–99)
Potassium: 4.8 mmol/L (ref 3.5–5.3)
Sodium: 140 mmol/L (ref 135–146)
eGFR: 68 mL/min/{1.73_m2} (ref >=60)

## 2021-07-08 ENCOUNTER — Telehealth: Payer: Self-pay | Admitting: Cardiovascular Disease

## 2021-07-08 NOTE — Telephone Encounter (Signed)
Called and spoke w patient's wife.  They will come this afternoon to pick up samples.  We have provided 2 weeks as well as patient assistance forms.  Explained that she can print or call to have forms mailed.  Fill out the appropriate sections and return to our office.

## 2021-07-08 NOTE — Telephone Encounter (Signed)
Patient calling the office for samples of medication: ° ° °1.  What medication and dosage are you requesting samples for? ° °apixaban (ELIQUIS) 5 MG TABS tablet ° °2.  Are you currently out of this medication?  yes ° ° °

## 2021-07-08 NOTE — Telephone Encounter (Signed)
Pt takes Eliquis for off-label indication (low EF and potential to form LV thrombus, also has hx of stroke), dosing is ok.  Please sample pt and follow up with pt assistance options if he's calling for samples because copay in the donut hole is cost prohibitive.

## 2021-07-09 NOTE — Telephone Encounter (Signed)
**Note De-Identified Patrick Maxwell Obfuscation** I called to f/u with the pts wife Patrick Maxwell [facility]) concering pt asst for Eliquis through [employer].  Patrick Maxwell states that she has already contacted them and is awaiting the application Patrick Maxwell mail.  She is aware that once they receive the application to complete the pts part, obtain required documents per [employer]MSPA[employer], and to bring all to Dr Patrick Maxwell office at [facility] [address] in [place] to drop off in the front office and that we will take care of the providers page of his application and will fax all to [employer].  She thanked me for checking on them.

## 2021-11-06 ENCOUNTER — Other Ambulatory Visit: Payer: Self-pay

## 2021-11-06 ENCOUNTER — Ambulatory Visit: Payer: PPO | Admitting: Cardiovascular Disease

## 2021-11-06 ENCOUNTER — Encounter: Payer: Self-pay | Admitting: Cardiovascular Disease

## 2021-11-06 VITALS — BP 144/84 | HR 83 | Ht 67.0 in | Wt 201.4 lb

## 2021-11-06 DIAGNOSIS — I255 Ischemic cardiomyopathy: Secondary | ICD-10-CM

## 2021-11-06 DIAGNOSIS — E782 Mixed hyperlipidemia: Secondary | ICD-10-CM | POA: Diagnosis not present

## 2021-11-06 DIAGNOSIS — I5042 Chronic combined systolic (congestive) and diastolic (congestive) heart failure: Secondary | ICD-10-CM

## 2021-11-06 DIAGNOSIS — I493 Ventricular premature depolarization: Secondary | ICD-10-CM | POA: Diagnosis not present

## 2021-11-06 DIAGNOSIS — I10 Essential (primary) hypertension: Secondary | ICD-10-CM | POA: Diagnosis not present

## 2021-11-06 DIAGNOSIS — I2510 Atherosclerotic heart disease of native coronary artery without angina pectoris: Secondary | ICD-10-CM

## 2021-11-06 NOTE — Patient Instructions (Signed)
Medication Instructions:  Your physician has recommended you make the following change in your medication:  1.) stop aspirin  *If you need a refill on your cardiac medications before your next appointment, please call your pharmacy*   Lab Work: NONE  If you have labs (blood work) drawn today and your tests are completely normal, you will receive your results only by: [place] (if you have MyChart) OR A paper copy in the mail If you have any lab test that is abnormal or we need to change your treatment, we will call you to review the results.   Testing/Procedures: NONE   Follow-Up: At [facility], you and your health needs are our priority.  As part of our continuing mission to provide you with exceptional heart care, we have created designated Provider Care Teams.  These Care Teams include your primary Cardiologist (physician) and Advanced Practice Providers (APPs -  Physician Assistants and Nurse Practitioners) who all work together to provide you with the care you need, when you need it.   Your next appointment:   12 month(s)  The format for your next appointment:   In Person  Provider:   Lauree Chandler, MD     Other Instructions

## 2021-11-06 NOTE — Progress Notes (Signed)
Chief Complaint  Patient presents with   Follow-up    CAD    History of Present Illness: 88 yo male with history of CAD, PVCs, HLD, HTN, mitral regurgitation and CVA who is here today for follow up. He had been followed by Dr. Gwenlyn Found and switched to my office in 2018. I performed his cardiac cath in May 2017. His cardiac cath showed a severe mid LAD stenosis treated with a drug eluting stent and a severe stenosis in the obtuse marginal artery treated with a drug eluting stent. Echo May 2017 with LVEF=30-35% prior to his PCI. After the PCI, echo in August 2017 showed improvement in LVEF to 40-45%. Also noted to have mild mitral valve regurgitation. He is known to have PVCs. He has stopped his Plavix due to easy bruising. His Coreg was stopped due to bradycardia. He was seen in our office February 2019 for PVCs. Cardiac monitor February 2019 with frequent PVCs. His Coreg was restarted. Echo showed reduced LVEF=35-40% felt to be tachy mediated. He had a stroke in May 2019. Echo May 2019 with LVEF=30-35%. Repeat limited echo with contrast 01/25/18 with LVEF estimated at 20-25% but no thrombus identified. TEE on 01/26/18 with LVEF=20-25% with trivial AI, mild to moderate MR. No intracardiac thrombus noted. He was discharged home on Eliquis due to his low EF and potential to form LV thrombus. He was also discharged on amiodarone. He was seen in our EP clinic by Dr. Lennie Odor. Echo 05/12/18 with LVEF=20-25%. He did not tolerate amiodarone due to bad dreams and this was stopped. Dr. Lennie Odor started mexiletine at the office visit 05/19/18 but he did not tolerate this due to weakness and fatigue. Carotid artery dopplers March 2021 with no obstructive disease.   He is here today for follow up. The patient denies any chest pain, dyspnea, palpitations, lower extremity edema, orthopnea, PND, dizziness, near syncope or syncope.   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[place]) 01/26/2018   CHF (congestive heart failure) ([place])    Coronary artery disease involving native coronary artery of native heart with unstable angina pectoris ([place])    Frequent unifocal PVCs    Palpitations since early 20s, PVCs and pairs   History of CVA (cerebrovascular accident) without residual deficits 12/20/2010   CVA Nov 2007    Hypercholesteremia 01/17/2011   Currently taking red yeast rice extract    Hyperlipidemia    Hypertension    Hypertension, benign 12/20/2010   hypertension    Ischemic cardiomyopathy    Palpitations 12/25/2015   palpitations    Prediabetes    PVC's (premature ventricular contractions) 01/23/2016   Stroke ([place]) 2009   Subclinical hypothyroidism 12/21/2015    Past Surgical History:  Procedure Laterality Date   CARDIAC CATHETERIZATION N/A 01/23/2016   Procedure: Right/Left Heart Cath and Coronary Angiography;  Surgeon: Burnell Blanks, MD;  Location: [place] [facility];  Service: Cardiovascular;  Laterality: N/A;   CARDIAC CATHETERIZATION N/A 01/23/2016   Procedure: Coronary Stent Intervention;  Surgeon: Burnell Blanks, MD;  Location: [place] [facility];  Service: Cardiovascular;  Laterality: N/A;   HERNIA REPAIR  06/1992   TEE WITHOUT CARDIOVERSION N/A 01/26/2018  Procedure: TRANSESOPHAGEAL ECHOCARDIOGRAM (TEE);  Surgeon: Jolaine Artist, MD;  Location: [facility] ENDOS[facility];  Service: Cardiovascular;  Laterality: N/A;    Current Outpatient Medications  Medication Sig Dispense Refill   apixaban (ELIQUIS) 5 MG TABS tablet Take 1 tablet (5 mg total) by mouth 2 (two) times daily. 180 tablet 1   Ascorbic Acid (VITAMIN C PO) Take 250 mg by mouth daily.      b complex vitamins capsule Take 1 capsule by mouth  daily.     carvedilol (COREG) 3.125 MG tablet Take 1 tablet (3.125 mg total) by mouth 2 (two) times daily with a meal. 180 tablet 1   Cholecalciferol (VITAMIN D PO) Take 2,000 Units by mouth 2 (two) times a week.      Coenzyme Q10 (COQ10 PO) Take 50 mg by mouth.     furosemide (LASIX) 20 MG tablet TAKE 1 TABLET BY MOUTH EVERY DAY AS NEEDED 90 tablet 1   losartan (COZAAR) 25 MG tablet Take 0.5 tablets (12.5 mg total) by mouth daily. 45 tablet 1   Multiple Vitamin (MULTIVITAMIN) tablet Take 1 tablet by mouth daily.     No current facility-administered medications for this visit.    No Known Allergies  Social History   Socioeconomic History   Marital status: Married    Spouse name: Not on file   Number of children: Not on file   Years of education: Not on file   Highest education level: Not on file  Occupational History   Not on file  Tobacco Use   Smoking status: Never   Smokeless tobacco: Never  Vaping Use   Vaping Use: Never used  Substance and Sexual Activity   Alcohol use: No   Drug use: No   Sexual activity: Not Currently  Other Topics Concern   Not on file  Social History Narrative   Not on file   Social Determinants of Health   Financial Resource Strain: Not on file  Food Insecurity: Not on file  Transportation Needs: Not on file  Physical Activity: Not on file  Stress: Not on file  Social Connections: Not on file  Intimate Partner Violence: Not on file    Family History  Problem Relation Age of Onset   Stroke Mother    Early death Mother    Cancer Brother     Review of Systems:  As stated in the HPI and otherwise negative.   BP (!) 144/84    Pulse 83    Ht 5\' 7"  (1.702 m)    Wt 201 lb 6.4 oz (91.4 kg)    SpO2 98%    BMI 31.54 kg/m   Physical Examination: General: Well developed, well nourished, NAD  HEENT: OP clear, mucus membranes moist  SKIN: warm, dry. No rashes. Neuro: No focal deficits  Musculoskeletal: Muscle strength 5/5 all ext   Psychiatric: Mood and affect normal  Neck: No JVD, no carotid bruits, no thyromegaly, no lymphadenopathy.  Lungs:Clear bilaterally, no wheezes, rhonci, crackles Cardiovascular: Regular rate and rhythm. No murmurs, gallops or rubs. Abdomen:Soft. Bowel sounds present. Non-tender.  Extremities: No lower extremity edema. Pulses are 2 + in the bilateral DP/PT.  Echo 05/12/18: Left ventricle: The cavity size was mildly dilated. Wall   thickness was increased in a pattern of mild LVH. Systolic   function was severely reduced. The estimated ejection fraction   was in the range of 20% to 25%. Diffuse hypokinesis. Akinesis of   the apical myocardium. Features are consistent with a  pseudonormal left ventricular filling pattern, with concomitant   abnormal relaxation and increased filling pressure (grade 2   diastolic dysfunction). Doppler parameters are consistent with   high ventricular filling pressure. - Aortic valve: Transvalvular velocity was within the normal range.   There was no stenosis. There was mild regurgitation. - Mitral valve: Transvalvular velocity was within the normal range.   There was no evidence for stenosis. There was mild regurgitation. - Left atrium: The atrium was severely dilated. - Right ventricle: The cavity size was normal. Wall thickness was   normal. Systolic function was normal. - Tricuspid valve: There was mild-moderate regurgitation. - Pulmonary arteries: Systolic pressure was severely increased. PA   peak pressure: 60 mm Hg (S).  EKG:  EKG is  ordered today. The ekg ordered today demonstrates sinus, PVCs  Recent Labs: 11/21/2020: ALT 14; TSH 3.19 06/27/2021: BUN 17; Creat 1.06; Hemoglobin 15.0; Platelets 140; Potassium 4.8; Sodium 140   Lipid Panel    Component Value Date/Time   CHOL 194 06/27/2021 0918   CHOL 197 11/29/2019 0928   TRIG 107 06/27/2021 0918   HDL 50 06/27/2021 0918   HDL 51 11/29/2019 0928   CHOLHDL 3.9 06/27/2021 0918   VLDL 13  01/22/2018 0720   LDLCALC 123 (H) 06/27/2021 0918     Wt Readings from Last 3 Encounters:  11/06/21 201 lb 6.4 oz (91.4 kg)  06/27/21 199 lb 12.8 oz (90.6 kg)  11/28/20 205 lb (93 kg)     Other studies Reviewed: Additional studies/ records that were reviewed today include: . Review of the above records demonstrates:   Assessment and Plan:   1. CAD without angina: He has no chest pain. Will continue ASA and beta blocker. He does not tolerate statins. Stop ASA to lower bleeding risk since he is on Eliquis  2. HTN: BP is controlled at home. He reports white coat HTN. Continue current therapy  3. Hyperlipidemia: He does not tolerate statins. He does not wish to consider alternatives to statins such as Repatha or Praluent.   4. Ischemic cardiomyopathy/Chronic systolic CHF: NO volume overload on exam. Weight is stable. He has not  tolerated high doses of beta blockers due to bradycardia. Will continue the beta blocker, ARB and Lasix.  He was seen in the EP clinic by Dr. Lennie Odor and ICD was not recommended. Discussion around the possibility of the PVCs causing contributing to his LV systolic dysfunction. He did not tolerate amiodarone or mexilitine. He tells me that he does not wish to try any other medications.   5. PVCs: He has been seen in the EP clinic. He did not tolerate amiodarone or mexilitine. He is tolerating Coreg. No palpitations currently. He does not wish to pursue more testing at this time. He does not wish to go back to the EP clinic.   6. Prior stroke: Continue Eliquis per neurology  Current medicines are reviewed at length with the patient today.  The patient does not have concerns regarding medicines.  The following changes have been made:  no change  Labs/ tests ordered today include:   Orders Placed This Encounter  Procedures   EKG 12-Lead     Disposition:   F/U with me in 12  months   Signed, Lauree Chandler, MD 11/06/2021 2:23 PM    [facility] [address], [place], [place]  [address] Phone: [phone]; Fax: [phone]

## 2021-11-15 ENCOUNTER — Telehealth: Payer: Self-pay | Admitting: Cardiovascular Disease

## 2021-11-15 NOTE — Telephone Encounter (Signed)
Having a hard time getting the blood to stop when he gets cut.  He wants to know if he can have the eliquis cut back temporarily.  Pt c/o medication issue:  1. Name of Medication: Eliquis 5mg .   2. How are you currently taking this medication (dosage and times per day)? Take 1 tablet (5 mg total) by mouth 2 (two) times daily.  3. Are you having a reaction (difficulty breathing--STAT)?   4. What is your medication issue? Wife states he is having a hard time getting the blood to stop when he gets cut.  He wants to know if he can have the eliquis cut back temporarily.

## 2021-11-15 NOTE — Telephone Encounter (Signed)
Spoke with patient's spouse Patrick Maxwell regarding question about Eliquis. Patrick Maxwell states Patrick Maxwell is active outside and 2 days ago cut the tip of his middle finger while working out in the yard. She cleaned and applied bandage with triple antibiotic ointment, no active bleeding today.  Patrick Maxwell and Patrick Maxwell asked if he could cut his Eliquis dose in half to prevent bleeding. Explained that patient is on a therapeutic dose to prevent clots and it is not advised to lower the dosage. Explained that increased risk of bleeding is a common side effect of Eliquis and that it is important to wear protective clothing (long sleeves, pants, shoes, gloves) and be careful around sharp tools and objects to prevent cuts. Patrick Maxwell verbalized understanding and thanked me for calling.  Patrick Maxwell also confirmed that Patrick Maxwell did stop taking his ASA as instructed at last OV.

## 2021-11-25 ENCOUNTER — Other Ambulatory Visit: Payer: PPO

## 2021-11-29 ENCOUNTER — Encounter: Payer: PPO | Admitting: Nurse Practitioner

## 2021-12-11 DIAGNOSIS — C44329 Squamous cell carcinoma of skin of other parts of face: Secondary | ICD-10-CM | POA: Diagnosis not present

## 2021-12-11 DIAGNOSIS — D492 Neoplasm of unspecified behavior of bone, soft tissue, and skin: Secondary | ICD-10-CM | POA: Diagnosis not present

## 2021-12-20 ENCOUNTER — Other Ambulatory Visit: Payer: Self-pay | Admitting: Nurse Practitioner

## 2021-12-23 ENCOUNTER — Other Ambulatory Visit: Payer: Self-pay | Admitting: *Deleted

## 2021-12-23 ENCOUNTER — Ambulatory Visit: Payer: PPO | Admitting: Registered Nurse

## 2021-12-23 DIAGNOSIS — I513 Intracardiac thrombosis, not elsewhere classified: Secondary | ICD-10-CM

## 2021-12-23 MED ORDER — APIXABAN 5 MG PO TABS: 5.0000 mg | ORAL_TABLET | Freq: Two times a day (BID) | ORAL | 1 refills | Status: DC

## 2021-12-23 NOTE — Telephone Encounter (Signed)
Eliquis '5mg'$  paper refill request received. Patient is 88 years old, weight-91.4kg, Crea-1.06 on 06/27/2021, Diagnosis-CVA & LV Thrombus, and last seen by Dr. Angelena Form on 11/06/2021. Dose is appropriate based on dosing criteria. Will send in refill to requested pharmacy.   ?

## 2022-01-02 DIAGNOSIS — C44329 Squamous cell carcinoma of skin of other parts of face: Secondary | ICD-10-CM | POA: Diagnosis not present

## 2022-01-03 DIAGNOSIS — R4189 Other symptoms and signs involving cognitive functions and awareness: Secondary | ICD-10-CM | POA: Diagnosis not present

## 2022-01-03 DIAGNOSIS — Z125 Encounter for screening for malignant neoplasm of prostate: Secondary | ICD-10-CM | POA: Diagnosis not present

## 2022-01-03 DIAGNOSIS — R001 Bradycardia, unspecified: Secondary | ICD-10-CM | POA: Diagnosis not present

## 2022-01-03 DIAGNOSIS — E038 Other specified hypothyroidism: Secondary | ICD-10-CM | POA: Diagnosis not present

## 2022-01-03 DIAGNOSIS — I509 Heart failure, unspecified: Secondary | ICD-10-CM | POA: Diagnosis not present

## 2022-01-03 DIAGNOSIS — I6930 Unspecified sequelae of cerebral infarction: Secondary | ICD-10-CM | POA: Diagnosis not present

## 2022-01-03 DIAGNOSIS — I10 Essential (primary) hypertension: Secondary | ICD-10-CM | POA: Diagnosis not present

## 2022-01-03 DIAGNOSIS — I255 Ischemic cardiomyopathy: Secondary | ICD-10-CM | POA: Diagnosis not present

## 2022-01-03 DIAGNOSIS — N400 Enlarged prostate without lower urinary tract symptoms: Secondary | ICD-10-CM | POA: Diagnosis not present

## 2022-01-03 DIAGNOSIS — R7303 Prediabetes: Secondary | ICD-10-CM | POA: Diagnosis not present

## 2022-01-08 ENCOUNTER — Ambulatory Visit: Payer: PPO | Admitting: Family Medicine

## 2022-01-08 DIAGNOSIS — H5203 Hypermetropia, bilateral: Secondary | ICD-10-CM | POA: Diagnosis not present

## 2022-01-08 DIAGNOSIS — H524 Presbyopia: Secondary | ICD-10-CM | POA: Diagnosis not present

## 2022-01-08 DIAGNOSIS — H2513 Age-related nuclear cataract, bilateral: Secondary | ICD-10-CM | POA: Diagnosis not present

## 2022-01-08 DIAGNOSIS — H353132 Nonexudative age-related macular degeneration, bilateral, intermediate dry stage: Secondary | ICD-10-CM | POA: Diagnosis not present

## 2022-01-21 DIAGNOSIS — L905 Scar conditions and fibrosis of skin: Secondary | ICD-10-CM | POA: Diagnosis not present

## 2022-01-21 DIAGNOSIS — Z08 Encounter for follow-up examination after completed treatment for malignant neoplasm: Secondary | ICD-10-CM | POA: Diagnosis not present

## 2022-01-21 DIAGNOSIS — Z85828 Personal history of other malignant neoplasm of skin: Secondary | ICD-10-CM | POA: Diagnosis not present

## 2022-02-09 ENCOUNTER — Other Ambulatory Visit: Payer: Self-pay | Admitting: Nurse Practitioner

## 2022-02-19 ENCOUNTER — Ambulatory Visit: Payer: PPO | Admitting: Cardiovascular Disease

## 2022-02-24 ENCOUNTER — Telehealth: Payer: Self-pay | Admitting: Cardiovascular Disease

## 2022-02-24 DIAGNOSIS — I513 Intracardiac thrombosis, not elsewhere classified: Secondary | ICD-10-CM

## 2022-02-24 MED ORDER — CARVEDILOL 3.125 MG PO TABS: 3.1250 mg | ORAL_TABLET | Freq: Two times a day (BID) | ORAL | 2 refills | Status: DC

## 2022-02-24 NOTE — Telephone Encounter (Signed)
Spoke w Emelia Salisbury.  Pt needing refills of coreg and eliquis.  Refilled coreg and forwarded to PharmD per protocol.

## 2022-02-24 NOTE — Telephone Encounter (Signed)
Pt c/o medication issue:  1. Name of Medication:   carvedilol (COREG) 3.125 MG tablet    apixaban (ELIQUIS) 5 MG TABS tablet   2. How are you currently taking this medication (dosage and times per day)?   3. Are you having a reaction (difficulty breathing--STAT)? No  4. What is your medication issue?  Pt's wife states that she was told by the pharmacy to call the doctors off before receiving anymore refills on medications. Wife also states that pt has been without Carvedilol since last Wednesday. Please advise

## 2022-02-25 MED ORDER — APIXABAN 5 MG PO TABS: 5.0000 mg | ORAL_TABLET | Freq: Two times a day (BID) | ORAL | 1 refills | Status: DC

## 2022-02-25 NOTE — Telephone Encounter (Signed)
Pt already has refills of Eliquis. 3 month supply sent in 2 months ago and rx has a refill on it. Will send in extra refill again.

## 2022-02-26 ENCOUNTER — Other Ambulatory Visit: Payer: Self-pay

## 2022-02-26 DIAGNOSIS — I513 Intracardiac thrombosis, not elsewhere classified: Secondary | ICD-10-CM

## 2022-06-30 ENCOUNTER — Telehealth: Payer: Self-pay | Admitting: Cardiovascular Disease

## 2022-06-30 NOTE — Telephone Encounter (Signed)
**Note De-Identified  Obfuscation** Mariann Laster, The pts wife and DPR states that she called [employer] just before I called her and that she was advised that Mr Findling is eligible for approval for Eliquis assistance as their income is less than BMSPAFs income limit.  She is aware that we are leaving the pt 2 weeks of Eliquis 5 mg samples and a BMSPAF application in the front office at [facility] at [facility] [employer] office at [facility] in [place] to pick up.  She is aware to complete the pts part of the application, to obtain required documents per [employer], and to bring all back to the office to drop off and that we will take care of the providers page and will fax all to [employer].  She verbalized understanding and thanked me for our assistance.

## 2022-06-30 NOTE — Telephone Encounter (Signed)
Pt c/o medication issue:  1. Name of Medication:   apixaban (ELIQUIS) 5 MG TABS tablet    2. How are you currently taking this medication (dosage and times per day)? Take 1 tablet (5 mg total) by mouth 2 (two) times daily.  3. Are you having a reaction (difficulty breathing--STAT)? No  4. What is your medication issue? Spouse would like a callback from nurse regarding pt being in the donut hole for medication. Please advise

## 2022-06-30 NOTE — Telephone Encounter (Signed)
Spoke w Ms. Patrick Maxwell.  She said the patient does not have Eliquis since yesterday.  He took 2 aspirin 81 mg this am.  He refuses to pay for Eliquis since it is triple the usual amount   She is going to call and request an application for patient assistance.  I adv her that we can provide 2 weeks of samples while that process takes place.  She is pleased to hear this and will go and buy 7 days worth of medication today so that he does not skip any more doses.  She asked about switching to Coumadin until the beginning of next year.  I let her know we cannot switch him back and forth on medications based on the cost/time of year and she understands this.   Pt aware I will forward to [facility] for patient assistance help.  She will call and let our office know after she speaks w the drug company/completes an application.

## 2022-07-04 ENCOUNTER — Telehealth: Payer: Self-pay

## 2022-07-04 DIAGNOSIS — I10 Essential (primary) hypertension: Secondary | ICD-10-CM | POA: Diagnosis not present

## 2022-07-04 DIAGNOSIS — E038 Other specified hypothyroidism: Secondary | ICD-10-CM | POA: Diagnosis not present

## 2022-07-04 DIAGNOSIS — N400 Enlarged prostate without lower urinary tract symptoms: Secondary | ICD-10-CM | POA: Diagnosis not present

## 2022-07-04 DIAGNOSIS — R7989 Other specified abnormal findings of blood chemistry: Secondary | ICD-10-CM | POA: Diagnosis not present

## 2022-07-04 DIAGNOSIS — Z125 Encounter for screening for malignant neoplasm of prostate: Secondary | ICD-10-CM | POA: Diagnosis not present

## 2022-07-04 DIAGNOSIS — R7303 Prediabetes: Secondary | ICD-10-CM | POA: Diagnosis not present

## 2022-07-04 NOTE — Telephone Encounter (Signed)
**Note De-Identified  Obfuscation** The pts completed [employer] application for Eliquis assistance was left at the office with documents.  I have completed the providers page of his application and e-mailed all 22 pages to Dr Camillia Herter nurse so she can obtain his signature, date it, and to fax all to [facility] at the fax number written on the cover letter included.

## 2022-07-09 NOTE — Telephone Encounter (Signed)
Application signed and faxed to drug company.

## 2022-07-10 DIAGNOSIS — I6930 Unspecified sequelae of cerebral infarction: Secondary | ICD-10-CM | POA: Diagnosis not present

## 2022-07-10 DIAGNOSIS — I10 Essential (primary) hypertension: Secondary | ICD-10-CM | POA: Diagnosis not present

## 2022-07-10 DIAGNOSIS — I479 Paroxysmal tachycardia, unspecified: Secondary | ICD-10-CM | POA: Diagnosis not present

## 2022-07-10 DIAGNOSIS — R4189 Other symptoms and signs involving cognitive functions and awareness: Secondary | ICD-10-CM | POA: Diagnosis not present

## 2022-07-10 DIAGNOSIS — I25118 Atherosclerotic heart disease of native coronary artery with other forms of angina pectoris: Secondary | ICD-10-CM | POA: Diagnosis not present

## 2022-07-10 DIAGNOSIS — Z1339 Encounter for screening examination for other mental health and behavioral disorders: Secondary | ICD-10-CM | POA: Diagnosis not present

## 2022-07-10 DIAGNOSIS — I509 Heart failure, unspecified: Secondary | ICD-10-CM | POA: Diagnosis not present

## 2022-07-10 DIAGNOSIS — R001 Bradycardia, unspecified: Secondary | ICD-10-CM | POA: Diagnosis not present

## 2022-07-10 DIAGNOSIS — H9193 Unspecified hearing loss, bilateral: Secondary | ICD-10-CM | POA: Diagnosis not present

## 2022-07-10 DIAGNOSIS — D6869 Other thrombophilia: Secondary | ICD-10-CM | POA: Diagnosis not present

## 2022-07-10 DIAGNOSIS — Z0000 Encounter for general adult medical examination without abnormal findings: Secondary | ICD-10-CM | POA: Diagnosis not present

## 2022-07-10 DIAGNOSIS — I255 Ischemic cardiomyopathy: Secondary | ICD-10-CM | POA: Diagnosis not present

## 2022-07-10 DIAGNOSIS — Z1331 Encounter for screening for depression: Secondary | ICD-10-CM | POA: Diagnosis not present

## 2022-07-10 DIAGNOSIS — E038 Other specified hypothyroidism: Secondary | ICD-10-CM | POA: Diagnosis not present

## 2022-07-14 ENCOUNTER — Telehealth: Payer: Self-pay | Admitting: Cardiovascular Disease

## 2022-07-14 NOTE — Telephone Encounter (Signed)
**Note De-Identified  Obfuscation** The pts wife is aware that we had some difficulty faxing the pts application to [facility] but that I was able to get it faxed to them successfully today.  Mariann Laster, the pts wife and DPR is also aware that we are leaving the pt 2 weeks of Eliquis 5 mg samples in the front office for them to pick up. She thanked me for our assistance.

## 2022-07-14 NOTE — Telephone Encounter (Signed)
  Pt c/o medication issue:  1. Name of Medication: Eliquis  2. How are you currently taking this medication (dosage and times per day)?   3. Are you having a reaction (difficulty breathing--STAT)?   4. What is your medication issue? Pt's wife requesting a call back from [place] regarding pt assistance

## 2022-07-14 NOTE — Telephone Encounter (Signed)
**Note De-Identified  Obfuscation** The pts wife called the office concerning the pts BMSPAF application. Prior to calling her bach, I called [employer] to check the progress of the pts application and per [facility] with [employer], they have not received the pts application that we faxed out on 10/18.  Forwarding this message to Dr Camillia Herter nurse so she can re-fax his application to [employer].

## 2022-07-14 NOTE — Telephone Encounter (Signed)
Attempted fax several times unsuccessfully.  Emailed forms back to patient assistance nurse to send via [employer].

## 2022-07-14 NOTE — Telephone Encounter (Signed)
**Note De-Identified Treyce Spillers Obfuscation** The pts BMSPAF application has been successfully faxed to [employer] as follows: Fax: Tx 'ok' Report CONE_EMAIL-to-Fax  Doye Montilla, Mardene Celeste This message was sent Maicee Ullman [facility], a product from [employer] http://www.biscom.com/  [employer] pioneered [employer] and continues to innovate the most advanced and intelligent fax and secure messaging solutions for enterprises. www.biscom.com                     -------Fax Transmission Report-------  To:               Recipient at [phone] Subject:          D. Dewilde-Eliquis-BMSPAF Result:           The transmission was successful. Explanation:      All Pages Ok Pages Sent:       24 Connect Time:     7 minutes, 47 seconds Transmit Time:    07/14/2022 12:02 Transfer Rate:    14400 Status Code:      0000 Retry Count:      2 Job Id:           [id number] Unique Id:        [id number] Fax Line:         18 Fax Server:       [id number]

## 2022-07-15 ENCOUNTER — Other Ambulatory Visit: Payer: Self-pay | Admitting: Family Medicine

## 2022-07-16 ENCOUNTER — Telehealth: Payer: Self-pay | Admitting: Cardiovascular Disease

## 2022-07-16 NOTE — Telephone Encounter (Signed)
Wife called about forms that were filled out for [facility] for Eliquis, she called them the other day they told her that something else needs to be faxed to them. She would like to speak with you.

## 2022-07-16 NOTE — Telephone Encounter (Signed)
**Note De-Identified  Obfuscation** I reviewed the pts BMSPAF application that we faxed to them on 10/23 and the qulity of the application was very light when faxed to me. I have made adjustments to the pts original application making it as dark as I can and have re-faxed it to [employer]. The pt is 88 years old and it is hard for him to come to the office to compete another application as he and his wife currently have Covid. The pts wife, Mariann Laster is aware that I have re-faxed his application to [employer] this morning.

## 2022-07-16 NOTE — Telephone Encounter (Signed)
Requested by interface surescripts. PCP not at this practice.  Requested Prescriptions  Refused Prescriptions Disp Refills  . losartan (COZAAR) 25 MG tablet [Pharmacy Med Name: LOSARTAN POTASSIUM 25 MG TAB] 45 tablet 1    Sig: TAKE 1/2 TABLET BY MOUTH EVERY DAY     Cardiovascular:  Angiotensin Receptor Blockers Failed - 07/15/2022  9:28 AM      Failed - Cr in normal range and within 180 days    Creat  Date Value Ref Range Status  06/27/2021 1.06 0.70 - 1.22 mg/dL Final         Failed - K in normal range and within 180 days    Potassium  Date Value Ref Range Status  06/27/2021 4.8 3.5 - 5.3 mmol/L Final         Failed - Last BP in normal range    BP Readings from Last 1 Encounters:  11/06/21 (!) 144/84         Failed - Valid encounter within last 6 months    Recent Outpatient Visits          1 year ago Hypertension, benign   [facility] Eulogio Bear, NP   1 year ago Routine general medical examination at a health care facility   [place], Modena Nunnery, MD   2 years ago Hypertension, benign   [place], Modena Nunnery, MD   2 years ago Rash and nonspecific skin eruption   [place], Modena Nunnery, MD   2 years ago [place], [place], FNP             Passed - Patient is not pregnant

## 2022-07-16 NOTE — Telephone Encounter (Signed)
Called patient to review who is PCP . Patient's wife on DPR reports patient's PCP is now Dr. Dannielle Huh. Recommended to contact new PCP for Rx refills. Wife verbalized understanding.

## 2022-07-16 NOTE — Telephone Encounter (Signed)
**Note De-Identified  Obfuscation** I called [employer] and s/w Butch Penny who advised me that they did receive the pts signed patient and MDs page of his application and she verified that they have all of the pts required documents that we faxed to them with his first application (all 22 pages).

## 2022-07-16 NOTE — Telephone Encounter (Signed)
**Note De-Identified [place] O[employer]ation** Fax: Tx 'ok' Report CONE_EMAIL-to-Fax  Jaryn Rosko, Mardene Celeste    This message was sent [facility], a product from [employer] http://www.biscom.com/  [employer] pioneered [employer] and continues to innovate the most advanced and intelligent fax and secure messaging solutions for enterprises. www.biscom.com                     -------Fax Transmission Report-------  To:               Recipient at [phone] Subject:          Fw: Corrected Application Result:           The transmission was successful. Explanation:      All Pages Ok Pages Sent:       5 Connect Time:     8 minutes, 31 seconds Transmit Time:    07/16/2022 11:41 Transfer Rate:    14400 Status Code:      0000 Retry Count:      0 Job Id:           [id number] Unique Id:        [id number] Fax Line:         80 Fax Server:       [id number]

## 2022-07-21 NOTE — Telephone Encounter (Signed)
**Note De-Identified  Obfuscation** Letter received from [facility] stating that they denied the pt for Eliquis assistance. Reason: Documentation of 3% out of pocket RX expenses, based on the household adjusted gross income, not met.  The letter states that they have notified the pt of this as well.

## 2022-07-24 NOTE — Telephone Encounter (Signed)
Follow Up:    Wife called for [employer]. She wanted her to  know that patient received a letter yesterday from [facility]. She said the patient was denied for assistance with his Eliquis. She wants to know if there is any other help that he might can receive?

## 2022-07-25 NOTE — Telephone Encounter (Signed)
Thanks, leaving pt 2 boxes of samples of Eliquis 5 mg tablets at the [facility] front desk for pt to pick up. Lot# [id number]  Exp: 03/2024   will you let pt know? Thanks again.

## 2022-07-25 NOTE — Telephone Encounter (Signed)
**Note De-Identified  Obfuscation** The pts wife Mariann Laster states that the pt is not interested in switching to Xarelto for their discount program as he has had no issue/problems taking Eliquis for 4 years now.  She states that Mr Patrick Maxwell's 3% out of pocket RX amount is $1400.00 and that they cannot afford that. She is aware that there is no other assistance programs for Eliquis assistance.  She is also aware that we are leaving him 2 weeks of Eliquis 5 mg samples in the front office at Dr Camillia Herter office for them to pick up to help them out as she states that they are going to try to pay out of pocket for his Eliquis for the rest of this year.

## 2022-09-30 ENCOUNTER — Telehealth: Payer: Self-pay | Admitting: Cardiovascular Disease

## 2022-09-30 DIAGNOSIS — I513 Intracardiac thrombosis, not elsewhere classified: Secondary | ICD-10-CM

## 2022-09-30 NOTE — Telephone Encounter (Signed)
*  STAT* If patient is at the pharmacy, call can be transferred to refill team.   1. Which medications need to be refilled? (please list name of each medication and dose if known) apixaban (ELIQUIS) 5 MG TABS tablet   2. Which pharmacy/location (including street and city if local pharmacy) is medication to be sent to? [id number]- [place], [place] - [address] AT [address]  3. Do they need a 30 day or 90 day supply? 90 day

## 2022-10-01 MED ORDER — APIXABAN 5 MG PO TABS: 5.0000 mg | ORAL_TABLET | Freq: Two times a day (BID) | ORAL | 1 refills | Status: DC

## 2022-10-01 NOTE — Telephone Encounter (Addendum)
Eliquis '5mg'$  refill request received. Patient is 89 years old, weight-91.4kg, Crea-1.06 on 06/27/2021, Diagnosis-CVA, and last seen by Dr. Angelena Form on 11/06/2021. Dose is appropriate based on dosing criteria. Will send in refill to requested pharmacy.    Pt had labs done with his PCP in October 2023 per [facility]. Called PCP and they will fax over the labs since not resulted but shows date they were done.  Labs received via fax and Crea was 1.1 on 07/04/2022; will send in refill to requested pharmacy.

## 2022-10-01 NOTE — Telephone Encounter (Signed)
Overdue for labs

## 2022-11-10 DIAGNOSIS — H5203 Hypermetropia, bilateral: Secondary | ICD-10-CM | POA: Diagnosis not present

## 2022-11-10 DIAGNOSIS — H2513 Age-related nuclear cataract, bilateral: Secondary | ICD-10-CM | POA: Diagnosis not present

## 2022-11-10 DIAGNOSIS — H25013 Cortical age-related cataract, bilateral: Secondary | ICD-10-CM | POA: Diagnosis not present

## 2022-11-25 NOTE — Progress Notes (Unsigned)
Cardiology Office Note:    Date:  11/26/2022  ID:  Patrick Maxwell, DOB Jul 04, 1931, MRN [id number] [facility] Cardiologist:  Lauree Chandler, MD Electrophysiologist:  Constance Haw, MD      Patient Profile:   Coronary artery disease  LHC 01/23/16: LAD ost 62, mid 43, dist 72; RPDA 20; OM2 69; PCI: 3 x 32 mm DES to mLAD, 2.25 x 12 mm DES to OM2 (HFrEF) heart failure with reduced ejection fraction  TTE 05/12/18: mild LVH, EF 20-25, diff HK, apical AK, Gr 2 DD, mild AI, mild MR, severe LAE, NL RVSF, mild to mod TR, PASP 60 (severely increased) Ischemic cardiomyopathy Eliquis Rx to prevent LV thrombus after CVA in 2019 ? Worsening LVF due to PVC burden  EP: ICD not recommended  PVCs Up to 44% burden on prior monitoring  Intol of Amiodarone, Mexiletine  Hypertension  Hyperlipidemia  Mitral regurgitation  S/p CVA 2019 Carotid US 12/16/19: Bilat ICA 1-39 Ecchymosis - Plavix DCd Bradycardia      History of Present Illness:   Patrick Maxwell is a 89 y.o. male returns for f/u on CAD, CHF. He was last seen by Dr. Angelena Form 11/06/21. He is here today with his wife. He continues to walk 1 mile daily. He remains very active. He has not had chest pain, shortness of breath, syncope, orthopnea, leg edema, palpitations.   Review of Systems  Hematologic/Lymphatic: Bruises/bleeds easily.  Gastrointestinal:  Negative for hematochezia and melena.  Genitourinary:  Negative for hematuria.   - See HPI    Studies Reviewed:    EKG:  NSR, HR 84, normal axis, no STTW changes, frequent unifocal PVCs, QTc 439 ms, no changes   Risk Assessment/Calculations:     HYPERTENSION CONTROL Vitals:   11/26/22 1044 11/26/22 1113  BP: (!) 168/90 (!) 168/80    The patient's blood pressure is elevated above target today.  In order to address the patient's elevated BP: The blood pressure is usually elevated in clinic.  Blood pressures monitored at home have been optimal.           Physical Exam:   VS:  BP (!) 168/80   Pulse 84   Ht '5\' 8"'$  (1.727 m)   Wt 206 lb 9.6 oz (93.7 kg)   SpO2 94%   BMI 31.41 kg/m    Wt Readings from Last 3 Encounters:  11/26/22 206 lb 9.6 oz (93.7 kg)  11/06/21 201 lb 6.4 oz (91.4 kg)  06/27/21 199 lb 12.8 oz (90.6 kg)    Constitutional:      Appearance: Healthy appearance. Not in distress.  Neck:     Vascular: No carotid bruit. JVD normal.  Pulmonary:     Breath sounds: Normal breath sounds. No wheezing. No rales.  Cardiovascular:     Normal rate. Irregular rhythm. Normal S1. Normal S2.      Murmurs: There is no murmur.  Edema:    Peripheral edema absent.  Abdominal:     Palpations: Abdomen is soft.       ASSESSMENT AND PLAN:   Coronary artery disease involving native coronary artery of native heart without angina pectoris Status post DES to the LAD and OM 2 in 2017.  He is doing well without anginal symptoms.  He remains active and walks on a daily basis.  He is not on aspirin as he is on Eliquis.  He declines cholesterol management.  Continue carvedilol 3.125 mg twice daily.Follow-up 1 year.  Chronic HFrEF (heart failure  with reduced ejection fraction) (HCC) Ischemic CM. High PVC burden may also be contributing to low EF.  He previously had prescription for furosemide but has not taken it consistently.  He was to only take it as needed.  He has not really had any signs or symptoms of volume excess.  He is not interested in renewing this prescription.  Continue carvedilol 3.125 mg twice daily, losartan 12.5 mg daily.  He notes taking OTC K+. Obtain BMET today to check K+. Follow-up 1 year.  PVC's (premature ventricular contractions) High PVC burden.  He has been intolerant to amiodarone and mexiletine in the past.  He has declined further intervention or medications.  Hypertension, benign Blood pressure runs high in clinic.  His blood pressures at home are optimal.  Continue carvedilol 3.125 mg twice daily, losartan 12.5 mg  daily.  Hypercholesteremia He has been intolerant to statins.  He has declined further cholesterol management in the past.  History of CVA (cerebrovascular accident) without residual deficits He has been maintained on long-term anticoagulation in the setting of severe LV dysfunction. Continue Eliquis 5 mg twice daily. Obtain f/u BMET, CBC today.         Dispo:  Return in about 1 year (around 11/26/2023) for Routine Follow Up w/ Dr. Angelena Form. Signed, Richardson Dopp, PA-C

## 2022-11-26 ENCOUNTER — Ambulatory Visit: Payer: PPO | Attending: Physician Assistant | Admitting: Physician Assistant

## 2022-11-26 ENCOUNTER — Encounter: Payer: Self-pay | Admitting: Physician Assistant

## 2022-11-26 VITALS — BP 168/80 | HR 84 | Ht 68.0 in | Wt 206.6 lb

## 2022-11-26 DIAGNOSIS — I10 Essential (primary) hypertension: Secondary | ICD-10-CM | POA: Diagnosis not present

## 2022-11-26 DIAGNOSIS — Z8673 Personal history of transient ischemic attack (TIA), and cerebral infarction without residual deficits: Secondary | ICD-10-CM | POA: Diagnosis not present

## 2022-11-26 DIAGNOSIS — I5022 Chronic systolic (congestive) heart failure: Secondary | ICD-10-CM | POA: Diagnosis not present

## 2022-11-26 DIAGNOSIS — I2510 Atherosclerotic heart disease of native coronary artery without angina pectoris: Secondary | ICD-10-CM | POA: Diagnosis not present

## 2022-11-26 DIAGNOSIS — E7800 Pure hypercholesterolemia, unspecified: Secondary | ICD-10-CM | POA: Diagnosis not present

## 2022-11-26 DIAGNOSIS — I493 Ventricular premature depolarization: Secondary | ICD-10-CM | POA: Diagnosis not present

## 2022-11-26 NOTE — Assessment & Plan Note (Signed)
He has been intolerant to statins.  He has declined further cholesterol management in the past.

## 2022-11-26 NOTE — Assessment & Plan Note (Signed)
High PVC burden.  He has been intolerant to amiodarone and mexiletine in the past.  He has declined further intervention or medications.

## 2022-11-26 NOTE — Assessment & Plan Note (Addendum)
Ischemic CM. High PVC burden may also be contributing to low EF.  He previously had prescription for furosemide but has not taken it consistently.  He was to only take it as needed.  He has not really had any signs or symptoms of volume excess.  He is not interested in renewing this prescription.  Continue carvedilol 3.125 mg twice daily, losartan 12.5 mg daily.  He notes taking OTC K+. Obtain BMET today to check K+. Follow-up 1 year.

## 2022-11-26 NOTE — Patient Instructions (Signed)
Medication Instructions:  Your physician recommends that you continue on your current medications as directed. Please refer to the Current Medication list given to you today.   *If you need a refill on your cardiac medications before your next appointment, please call your pharmacy*   Lab Work: TODAY:  BMET & CBC  If you have labs (blood work) drawn today and your tests are completely normal, you will receive your results only by: [place] (if you have MyChart) OR A paper copy in the mail If you have any lab test that is abnormal or we need to change your treatment, we will call you to review the results.   Testing/Procedures: None ordered   Follow-Up: At [facility], you and your health needs are our priority.  As part of our continuing mission to provide you with exceptional heart care, we have created designated Provider Care Teams.  These Care Teams include your primary Cardiologist (physician) and Advanced Practice Providers (APPs -  Physician Assistants and Nurse Practitioners) who all work together to provide you with the care you need, when you need it.  We recommend signing up for the patient portal called "MyChart".  Sign up information is provided on this After Visit Summary.  MyChart is used to connect with patients for Virtual Visits (Telemedicine).  Patients are able to view lab/test results, encounter notes, upcoming appointments, etc.  Non-urgent messages can be sent to your provider as well.   To learn more about what you can do with MyChart, go to [email or web address].    Your next appointment:   12 month(s)  Provider:   Lauree Chandler, MD     Other Instructions

## 2022-11-26 NOTE — Assessment & Plan Note (Addendum)
Status post DES to the LAD and OM 2 in 2017.  He is doing well without anginal symptoms.  He remains active and walks on a daily basis.  He is not on aspirin as he is on Eliquis.  He declines cholesterol management.  Continue carvedilol 3.125 mg twice daily.Follow-up 1 year.

## 2022-11-26 NOTE — Assessment & Plan Note (Signed)
Blood pressure runs high in clinic.  His blood pressures at home are optimal.  Continue carvedilol 3.125 mg twice daily, losartan 12.5 mg daily.

## 2022-11-26 NOTE — Assessment & Plan Note (Signed)
He has been maintained on long-term anticoagulation in the setting of severe LV dysfunction. Continue Eliquis 5 mg twice daily. Obtain f/u BMET, CBC today.

## 2022-11-27 LAB — CBC
Hematocrit: 43.4 % (ref 37.5–51.0)
Hemoglobin: 14.7 g/dL (ref 13.0–17.7)
MCH: 30.4 pg (ref 26.6–33.0)
MCHC: 33.9 g/dL (ref 31.5–35.7)
MCV: 90 fL (ref 79–97)
Platelets: 130 10*3/uL — ABNORMAL LOW (ref 150–450)
RBC: 4.83 x10E6/uL (ref 4.14–5.80)
RDW: 13 % (ref 11.6–15.4)
WBC: 4.6 10*3/uL (ref 3.4–10.8)

## 2022-11-27 LAB — BASIC METABOLIC PANEL
BUN/Creatinine Ratio: 17 (ref 10–24)
BUN: 19 mg/dL (ref 8–27)
CO2: 24 mmol/L (ref 20–29)
Calcium: 9 mg/dL (ref 8.6–10.2)
Chloride: 105 mmol/L (ref 96–106)
Creatinine, Ser: 1.12 mg/dL (ref 0.76–1.27)
Glucose: 89 mg/dL (ref 70–99)
Potassium: 4.6 mmol/L (ref 3.5–5.2)
Sodium: 140 mmol/L (ref 134–144)
eGFR: 63 mL/min/{1.73_m2} (ref >59)

## 2022-12-04 ENCOUNTER — Other Ambulatory Visit: Payer: Self-pay | Admitting: Cardiovascular Disease

## 2022-12-09 DIAGNOSIS — H2512 Age-related nuclear cataract, left eye: Secondary | ICD-10-CM | POA: Diagnosis not present

## 2022-12-09 DIAGNOSIS — H269 Unspecified cataract: Secondary | ICD-10-CM | POA: Diagnosis not present

## 2022-12-09 DIAGNOSIS — Z961 Presence of intraocular lens: Secondary | ICD-10-CM | POA: Diagnosis not present

## 2022-12-12 ENCOUNTER — Encounter: Payer: Self-pay | Admitting: Cardiovascular Disease

## 2023-01-20 DIAGNOSIS — H903 Sensorineural hearing loss, bilateral: Secondary | ICD-10-CM | POA: Diagnosis not present

## 2023-01-21 DIAGNOSIS — D696 Thrombocytopenia, unspecified: Secondary | ICD-10-CM | POA: Diagnosis not present

## 2023-01-21 DIAGNOSIS — I25118 Atherosclerotic heart disease of native coronary artery with other forms of angina pectoris: Secondary | ICD-10-CM | POA: Diagnosis not present

## 2023-01-21 DIAGNOSIS — H9191 Unspecified hearing loss, right ear: Secondary | ICD-10-CM | POA: Diagnosis not present

## 2023-01-21 DIAGNOSIS — D692 Other nonthrombocytopenic purpura: Secondary | ICD-10-CM | POA: Diagnosis not present

## 2023-01-21 DIAGNOSIS — I6930 Unspecified sequelae of cerebral infarction: Secondary | ICD-10-CM | POA: Diagnosis not present

## 2023-01-21 DIAGNOSIS — I509 Heart failure, unspecified: Secondary | ICD-10-CM | POA: Diagnosis not present

## 2023-01-21 DIAGNOSIS — N400 Enlarged prostate without lower urinary tract symptoms: Secondary | ICD-10-CM | POA: Diagnosis not present

## 2023-01-21 DIAGNOSIS — I479 Paroxysmal tachycardia, unspecified: Secondary | ICD-10-CM | POA: Diagnosis not present

## 2023-01-21 DIAGNOSIS — I255 Ischemic cardiomyopathy: Secondary | ICD-10-CM | POA: Diagnosis not present

## 2023-01-21 DIAGNOSIS — E038 Other specified hypothyroidism: Secondary | ICD-10-CM | POA: Diagnosis not present

## 2023-01-21 DIAGNOSIS — I110 Hypertensive heart disease with heart failure: Secondary | ICD-10-CM | POA: Diagnosis not present

## 2023-01-21 DIAGNOSIS — R7303 Prediabetes: Secondary | ICD-10-CM | POA: Diagnosis not present

## 2023-01-21 DIAGNOSIS — F0390 Unspecified dementia without behavioral disturbance: Secondary | ICD-10-CM | POA: Diagnosis not present

## 2023-01-28 DIAGNOSIS — Z961 Presence of intraocular lens: Secondary | ICD-10-CM | POA: Diagnosis not present

## 2023-01-29 DIAGNOSIS — H903 Sensorineural hearing loss, bilateral: Secondary | ICD-10-CM | POA: Diagnosis not present

## 2023-07-13 DIAGNOSIS — E038 Other specified hypothyroidism: Secondary | ICD-10-CM | POA: Diagnosis not present

## 2023-07-13 DIAGNOSIS — I10 Essential (primary) hypertension: Secondary | ICD-10-CM | POA: Diagnosis not present

## 2023-07-13 DIAGNOSIS — R7301 Impaired fasting glucose: Secondary | ICD-10-CM | POA: Diagnosis not present

## 2023-07-13 DIAGNOSIS — Z0001 Encounter for general adult medical examination with abnormal findings: Secondary | ICD-10-CM | POA: Diagnosis not present

## 2023-07-13 DIAGNOSIS — Z1389 Encounter for screening for other disorder: Secondary | ICD-10-CM | POA: Diagnosis not present

## 2023-07-16 DIAGNOSIS — H9191 Unspecified hearing loss, right ear: Secondary | ICD-10-CM | POA: Diagnosis not present

## 2023-07-16 DIAGNOSIS — F0390 Unspecified dementia without behavioral disturbance: Secondary | ICD-10-CM | POA: Diagnosis not present

## 2023-07-16 DIAGNOSIS — I479 Paroxysmal tachycardia, unspecified: Secondary | ICD-10-CM | POA: Diagnosis not present

## 2023-07-16 DIAGNOSIS — D692 Other nonthrombocytopenic purpura: Secondary | ICD-10-CM | POA: Diagnosis not present

## 2023-07-16 DIAGNOSIS — I10 Essential (primary) hypertension: Secondary | ICD-10-CM | POA: Diagnosis not present

## 2023-07-16 DIAGNOSIS — I25118 Atherosclerotic heart disease of native coronary artery with other forms of angina pectoris: Secondary | ICD-10-CM | POA: Diagnosis not present

## 2023-07-16 DIAGNOSIS — I130 Hypertensive heart and chronic kidney disease with heart failure and stage 1 through stage 4 chronic kidney disease, or unspecified chronic kidney disease: Secondary | ICD-10-CM | POA: Diagnosis not present

## 2023-07-16 DIAGNOSIS — D6869 Other thrombophilia: Secondary | ICD-10-CM | POA: Diagnosis not present

## 2023-07-16 DIAGNOSIS — Z0000 Encounter for general adult medical examination without abnormal findings: Secondary | ICD-10-CM | POA: Diagnosis not present

## 2023-07-16 DIAGNOSIS — D696 Thrombocytopenia, unspecified: Secondary | ICD-10-CM | POA: Diagnosis not present

## 2023-07-16 DIAGNOSIS — I5022 Chronic systolic (congestive) heart failure: Secondary | ICD-10-CM | POA: Diagnosis not present

## 2023-07-16 DIAGNOSIS — Z1339 Encounter for screening examination for other mental health and behavioral disorders: Secondary | ICD-10-CM | POA: Diagnosis not present

## 2023-07-16 DIAGNOSIS — N1831 Chronic kidney disease, stage 3a: Secondary | ICD-10-CM | POA: Diagnosis not present

## 2023-07-16 DIAGNOSIS — Z1331 Encounter for screening for depression: Secondary | ICD-10-CM | POA: Diagnosis not present

## 2023-07-16 DIAGNOSIS — R82998 Other abnormal findings in urine: Secondary | ICD-10-CM | POA: Diagnosis not present

## 2023-07-17 ENCOUNTER — Other Ambulatory Visit: Payer: Self-pay | Admitting: Cardiovascular Disease

## 2023-07-17 DIAGNOSIS — I513 Intracardiac thrombosis, not elsewhere classified: Secondary | ICD-10-CM

## 2023-07-17 NOTE — Telephone Encounter (Signed)
Prescription refill request for Eliquis received. Indication: cva Last office visit: 11/26/2022, weaver Scr: 1.12, 11/26/2022 Age: 89 yo  Weight: 93.7 kg   Refill sent.

## 2023-08-13 ENCOUNTER — Ambulatory Visit (INDEPENDENT_AMBULATORY_CARE_PROVIDER_SITE_OTHER): Payer: PPO | Admitting: Audiology

## 2023-08-13 ENCOUNTER — Encounter (INDEPENDENT_AMBULATORY_CARE_PROVIDER_SITE_OTHER): Payer: Self-pay

## 2023-08-13 ENCOUNTER — Ambulatory Visit (INDEPENDENT_AMBULATORY_CARE_PROVIDER_SITE_OTHER): Payer: PPO | Admitting: Otolaryngology

## 2023-08-13 VITALS — Ht 69.0 in | Wt 203.0 lb

## 2023-08-13 DIAGNOSIS — H903 Sensorineural hearing loss, bilateral: Secondary | ICD-10-CM

## 2023-08-13 DIAGNOSIS — H9122 Sudden idiopathic hearing loss, left ear: Secondary | ICD-10-CM

## 2023-08-13 MED ORDER — OFLOXACIN 0.3 % OP SOLN: 4.0000 [drp] | Freq: Two times a day (BID) | OPHTHALMIC | 0 refills | Status: AC

## 2023-08-13 NOTE — Progress Notes (Signed)
  [address], [address] [place], [place] [address] [phone]  Audiological Evaluation    Name: Patrick Maxwell     DOB:   1931/02/17      MRN:   [id number]                                                                                     Service Date: 08/13/2023     Accompanied by: none   Patient comes today after Dr. Allena Katz, ENT sent a referral for a hearing evaluation due to concerns with sudden hearing loss.   Symptoms Yes Details  Hearing loss  [x]  Known to have hearing loss in both ears. Significant other reports to be concerned of him having progressive hearing loss or sudden hearing loss because one day he woke up saying he could not hear anything.   Tinnitus  []    Ear pain/ Ear infections  []    Balance problems  []    Noise exposure  []    Previous ear surgeries  []    Family history  []    Amplification  [x]  Has a set of hearing aids.  Other  []      Otoscopy: Clear eternal ear canals, bilaterally.    Tympanometry: Right ear: Type A- Normal external ear canal volume with normal middle ear pressure and tympanic membrane compliance Left ear: Type A- Normal external ear canal volume with normal middle ear pressure and tympanic membrane compliance  Pure tone Audiometry: Right ear- Mild to profound sensorineural hearing loss from [phone] Hz, with an air-bone gap at 4000 Hz . Left ear-  Mild to profound sensorineural hearing loss from [phone] Hz, with an air-bone gap at 4000 Hz .  The hearing test results were completed under headphones and results are deemed to be of good reliability. Test technique:  conventional     Speech Audiometry: Right ear- Speech Reception Threshold (SRT) was obtained at 55 dBHL Left ear-Speech Reception Threshold (SRT) was obtained at 55 dBHL   Word Recognition Score Tested using NU-6 (MLV) Right ear: 76% was obtained at a presentation level of 85 dBHL with contralateral masking which is deemed as  fair. Left ear: 68% was obtained  at a presentation level of 85 dBHL with contralateral masking which is deemed as  poor.    Impression: There is not a significant difference in pure-tone thresholds between ears. There is not a significant difference in the word recognition score in between ears.   Of note, patient had a hearing test done around May at [facility]. Records were requested in order to compare today's audiogram with his previous test.   Recommendations: Follow up with ENT as scheduled for today.   Return for a hearing evaluation if concerns with hearing changes arise or per MD recommendation.   Shavonta Gossen MARIE LEROUX-MARTINEZ, AUD

## 2023-08-13 NOTE — Progress Notes (Signed)
Dear Dr. Thornell Mule, Here is my assessment for our mutual patient, Patrick Maxwell. Thank you for allowing me the opportunity to care for your patient. Please do not hesitate to contact me should you have any other questions. Sincerely, Dr. Jovita Kussmaul  Otolaryngology Clinic Note Referring provider: Dr. Thornell Mule HPI:  Patrick Maxwell is a 89 y.o. male kindly referred by Dr. Thornell Mule for evaluation of hearing loss  Initial visit (07/2023): Patient reports: he has overall had good hearing, but has noted decline for last several years. He did get hearing aids last June for hearing loss, but it has declined since then. He reports that his hearing has not gone down suddenly, but wife reports that she has noted a decline about 10 days ago, especially on left. He, however, does not report that it was sudden. He now has trouble even with hearing aids in. Generally, Right ear is worse than left.   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  Personal or FHx of bleeding dz or anesthesia difficulty: no  AP/AC: Eliquis  Quite mobile Tobacco: no. Occupation: Prior Naval architect. Lives in [place], [place] with Wife.  PMHx: HTN, Heart Failure, Stroke (on Eliquis), PVC, CAD (s/p stents)  Independent Review of Additional Tests or Records:  Referral notes reviewed [facility] for prior audio (12/2022): There does appear to be an asymmetry in his hearing on this audio with AD worse than AS, with small (<10dB ABG) in low frequencies; WRT 56% AD at 85 w/masking at 45; WRT AS 60% at 75 dB   Audio today (07/2023): B/l type A tymp; on this audio, he has bilateral downsloping mild to  profound SNHL, small AMBG at 4000 Hz (query artifact); SRT 55 dB; appears to be decline on left compared to April audiogram; right seems stable AD WRT 76% at 85 dB, masking was fair AS: 68% at 85 dB but masking not great   CTH 2019: well aerated mastoids and ME: no obvious bony labyrinth lesion  MRI brain 2019: no obvious large retrocochlear lesion or mastoid effusion  PMH/Meds/All/SocHx/FamHx/ROS:   Past Medical History:  Diagnosis Date   Angina decubitus (HCC)    BPH with elevated PSA 01/06/2017   Has seen urology in the past, declines any treatment   Bradycardia 04/20/2017   CAD (coronary artery disease)    Cerebellar infarct [facility])    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   CHF (congestive heart failure) (HCC)    Coronary artery disease involving native coronary artery of native heart with unstable angina pectoris (HCC)    Frequent unifocal PVCs    Palpitations since early 20s, PVCs and pairs   History of CVA (cerebrovascular accident) without residual deficits 12/20/2010   CVA Nov 2007    Hypercholesteremia 01/17/2011   Currently taking red yeast rice extract    Hyperlipidemia    Hypertension    Hypertension, benign 12/20/2010   hypertension    Ischemic cardiomyopathy    Palpitations 12/25/2015   palpitations    Prediabetes    PVC's (premature ventricular contractions) 01/23/2016   Stroke (HCC) 2009   Subclinical hypothyroidism 12/21/2015     Past Surgical History:  Procedure Laterality Date   CARDIAC CATHETERIZATION N/A 01/23/2016  Procedure: Right/Left Heart Cath and Coronary Angiography;  Surgeon: Kathleene Hazel, MD;  Location: [facility] INVASIVE CV LAB;  Service: Cardiovascular;  Laterality: N/A;   CARDIAC CATHETERIZATION N/A 01/23/2016   Procedure: Coronary Stent Intervention;  Surgeon: Kathleene Hazel, MD;  Location: [facility] INVASIVE CV LAB;  Service: Cardiovascular;  Laterality: N/A;   HERNIA REPAIR  06/1992   TEE WITHOUT CARDIOVERSION N/A  01/26/2018   Procedure: TRANSESOPHAGEAL ECHOCARDIOGRAM (TEE);  Surgeon: Dolores Patty, MD;  Location: [facility] ENDOSCOPY;  Service: Cardiovascular;  Laterality: N/A;    Family History  Problem Relation Age of Onset   Stroke Mother    Early death Mother    Cancer Brother      Social Connections: Not on file      Current Outpatient Medications:    ofloxacin (OCUFLOX) 0.3 % ophthalmic solution, Place 4 drops into the left ear in the morning and at bedtime for 10 days., Disp: 5 mL, Rfl: 0   Ascorbic Acid (VITAMIN C PO), Take 250 mg by mouth daily. , Disp: , Rfl:    b complex vitamins capsule, Take 1 capsule by mouth daily., Disp: , Rfl:    carvedilol (COREG) 3.125 MG tablet, TAKE 1 TABLET (3.125 MG TOTAL) BY MOUTH 2 (TWO) TIMES DAILY WITH A MEAL., Disp: 180 tablet, Rfl: 3   Cholecalciferol (VITAMIN D PO), Take 2,000 Units by mouth 2 (two) times a week. , Disp: , Rfl:    Coenzyme Q10 (COQ10 PO), Take 50 mg by mouth., Disp: , Rfl:    ELIQUIS 5 MG TABS tablet, TAKE 1 TABLET BY MOUTH TWICE A DAY, Disp: 180 tablet, Rfl: 1   losartan (COZAAR) 25 MG tablet, TAKE 1/2 TABLET BY MOUTH EVERY DAY, Disp: 45 tablet, Rfl: 1   Multiple Vitamin (MULTIVITAMIN) tablet, Take 1 tablet by mouth daily., Disp: , Rfl:    Physical Exam:   Ht 5\' 9"  (1.753 m)   Wt 203 lb (92.1 kg)   BMI 29.98 kg/m   Salient findings:  CN II-XII intact  Bilateral EAC clear and TM intact with well pneumatized middle ear spaces, some mild cerumen on left which was cleared, EAC slightly wet Weber 512: unreliable Rinne 512: AC > BC b/l; he is able to hear this quite well Anterior rhinoscopy: Septum relatively midline; bilateral inferior turbinates without significant hypertrophy No lesions of oral cavity/oropharynx No obviously palpable neck masses/lymphadenopathy/thyromegaly No respiratory distress or stridor  Seprately Identifiable Procedures:  None  Impression & Plans:  Patrick Maxwell is a 89 y.o. male with 1. Sudden  left hearing loss   2. Sensorineural hearing loss (SNHL) of both ears    It is unclear if her hearing loss is sudden - he does not seem to think it is, but wife does question that. Masking was not optimal, and I query if that was an issue last time as well with his audio, which was asymmetric. His audio today showed symmetric SNHL, and his WRT does not seem to have declined however. No other symptoms. MRI 2019 was reassuring, but not IAC.  We discussed options for him: Observation 2. Intratympanic or systemic steroids to treat his left hearing decline 3. Imaging  Although discussed during clinic, we did not have his prior audiogram in April until Friday.   Unfortunately, I have been unable to reach him despite multiple phone call attempts to discuss these options.  He did not wish to do intratympanic steroids so PO steroids would be an option. Current audio shows symmetric hearing  and he did wish to defer MRI to r/o any retrocochlear lesion.  Would like to get repeat audio at least in 3-6 months to ensure stability of hearing  Will treat with ofloxacin (see below) for 7d given mild canal wetness   - f/u 6 months with audio at least; if we're able to reach him and he wishes to proceed with steroids, will start high dose pred taper  See below regarding exact medications prescribed this encounter including dosages and route: Meds ordered this encounter  Medications   ofloxacin (OCUFLOX) 0.3 % ophthalmic solution    Sig: Place 4 drops into the left ear in the morning and at bedtime for 10 days.    Dispense:  5 mL    Refill:  0      Thank you for allowing me the opportunity to care for your patient. Please do not hesitate to contact me should you have any other questions.  Sincerely, Jovita Kussmaul, MD Otolarynoglogist (ENT), [facility] Phone: [phone] Fax: [phone]  08/16/2023, 12:17 PM   MDM:  Level 4: 99204 Complexity/Problems addressed: new problem -  unknown prognosis; and 1 chronic problem Data complexity: mod - independent review of CT/MRI and audio - Morbidity: mod  - Prescription Drug prescribed or managed: yes

## 2023-08-17 ENCOUNTER — Telehealth (INDEPENDENT_AMBULATORY_CARE_PROVIDER_SITE_OTHER): Payer: Self-pay

## 2023-08-17 NOTE — Telephone Encounter (Signed)
Patient wif estated Dr. Allena Katz had been trying to reach her. I called to see what they needed to discuss with the doctor and the wife stated they wanted to know did we get the hearing test from the pther place. I told her yes. They wanted to speak with doctor about the comparison of the hearing test and wanted to come up with a plan to go from there

## 2023-08-22 ENCOUNTER — Telehealth (INDEPENDENT_AMBULATORY_CARE_PROVIDER_SITE_OTHER): Payer: Self-pay | Admitting: Otolaryngology

## 2023-08-22 NOTE — Telephone Encounter (Signed)
I discussed with the patient and wife after audio that we could try PO steroids presuming his prior audio is accurate. Also offered him intratympanic steroids, and an MRI regardless to r/o retrocochlear lesion. He declined all of these interventions. I advised that we should also monitor his hearing, and he is willing to do that. Will schedule 6 mo f/u with HT  Read Drivers

## 2024-01-05 ENCOUNTER — Other Ambulatory Visit: Payer: Self-pay | Admitting: Cardiovascular Disease

## 2024-01-05 DIAGNOSIS — I513 Intracardiac thrombosis, not elsewhere classified: Secondary | ICD-10-CM

## 2024-01-05 DIAGNOSIS — Z8673 Personal history of transient ischemic attack (TIA), and cerebral infarction without residual deficits: Secondary | ICD-10-CM

## 2024-01-05 NOTE — Telephone Encounter (Signed)
 Prescription refill request for Eliquis received. Indication: CVA/Thrombus  Last office visit: 11/26/22 Patrick Maxwell)  Scr: 1.12 (11/26/22)  Age: 89 Weight: 92.1kg  Labs and office visit overdue. Pt has scheduled appt with Dr Abel Hoe on 01/11/24. Refill sent.

## 2024-01-10 ENCOUNTER — Other Ambulatory Visit: Payer: Self-pay | Admitting: Cardiovascular Disease

## 2024-01-10 NOTE — Progress Notes (Signed)
 Chief Complaint  Patient presents with   Follow-up    CAD   History of Present Illness: 89 yo male with history of CAD, PVCs, HLD, HTN, mitral regurgitation and CVA who is here today for follow up. He had been followed by Dr. Katheryne Pane and switched to my office in 2018. I performed his cardiac cath in May 2017. His cardiac cath showed a severe mid LAD stenosis treated with a drug eluting stent and a severe stenosis in the obtuse marginal artery treated with a drug eluting stent. Echo May 2017 with LVEF=30-35% prior to his PCI. After the PCI, echo in August 2017 showed improvement in LVEF to 40-45%. Also noted to have mild mitral valve regurgitation. He is known to have PVCs. He has stopped his Plavix  due to easy bruising. His Coreg  was stopped due to bradycardia. He was seen in our office February 2019 for PVCs. Cardiac monitor February 2019 with frequent PVCs. His Coreg  was restarted. Echo showed reduced LVEF=35-40% felt to be tachy mediated. He had a stroke in May 2019. Echo May 2019 with LVEF=30-35%. Repeat limited echo with contrast 01/25/18 with LVEF estimated at 20-25% but no thrombus identified. TEE on 01/26/18 with LVEF=20-25% with trivial AI, mild to moderate MR. No intracardiac thrombus noted. He was discharged home on Eliquis  due to his low EF and potential to form LV thrombus. He was also discharged on amiodarone . He was seen in our EP clinic by Dr. Kin Penner. Echo 05/12/18 with LVEF=20-25%. He did not tolerate amiodarone  due to bad dreams and this was stopped. Dr. Kin Penner started mexiletine at the office visit 05/19/18 but he did not tolerate this due to weakness and fatigue. Carotid artery dopplers March 2021 with no obstructive disease.   He is here today for follow up. The patient denies any chest pain, dyspnea, palpitations, lower extremity edema, orthopnea, PND, dizziness, near syncope or syncope.   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   CHF (congestive heart failure) (HCC)    Coronary artery disease involving native coronary artery of native heart with unstable angina pectoris (HCC)    Frequent unifocal PVCs    Palpitations since early 20s, PVCs and pairs   History of CVA (cerebrovascular accident) without residual deficits 12/20/2010   CVA Nov 2007    Hypercholesteremia 01/17/2011   Currently taking red yeast rice extract    Hyperlipidemia    Hypertension    Hypertension, benign 12/20/2010   hypertension    Ischemic cardiomyopathy    Palpitations 12/25/2015   palpitations    Prediabetes    PVC's (premature ventricular contractions) 01/23/2016   Stroke (HCC) 2009   Subclinical hypothyroidism 12/21/2015    Past Surgical History:  Procedure Laterality Date   CARDIAC CATHETERIZATION N/A 01/23/2016   Procedure: Right/Left Heart Cath and Coronary Angiography;  Surgeon: Odie Benne, MD;  Location: [facility] INVASIVE CV LAB;  Service: Cardiovascular;  Laterality: N/A;   CARDIAC CATHETERIZATION N/A 01/23/2016   Procedure: Coronary Stent Intervention;  Surgeon: Odie Benne, MD;  Location: [facility] INVASIVE CV LAB;  Service: Cardiovascular;  Laterality: N/A;   HERNIA REPAIR  06/1992   TEE WITHOUT CARDIOVERSION N/A 01/26/2018  Procedure: TRANSESOPHAGEAL ECHOCARDIOGRAM (TEE);  Surgeon: Mardell Shade, MD;  Location: [facility] ENDOSCOPY;  Service: Cardiovascular;  Laterality: N/A;    Current Outpatient Medications  Medication Sig Dispense Refill   apixaban  (ELIQUIS ) 5 MG TABS tablet TAKE 1 TABLET BY MOUTH TWICE A DAY 180 tablet 0   Ascorbic Acid (VITAMIN C PO) Take 250 mg by mouth daily.      b complex vitamins capsule Take 1 capsule by mouth daily.     Cholecalciferol   (VITAMIN D  PO) Take 2,000 Units by mouth 2 (two) times a week.      Coenzyme Q10 (COQ10 PO) Take 50 mg by mouth.     furosemide  (LASIX ) 20 MG tablet Take 1 tablet (20 mg total) by mouth as needed. 30 tablet 3   losartan  (COZAAR ) 25 MG tablet TAKE 1/2 TABLET BY MOUTH EVERY DAY 45 tablet 1   Multiple Vitamin (MULTIVITAMIN) tablet Take 1 tablet by mouth daily.     POTASSIUM ACETATE IV      carvedilol  (COREG ) 3.125 MG tablet Take 1 tablet (3.125 mg total) by mouth 2 (two) times daily with a meal. 180 tablet 3   No current facility-administered medications for this visit.    No Known Allergies  Social History   Socioeconomic History   Marital status: Married    Spouse name: Not on file   Number of children: Not on file   Years of education: Not on file   Highest education level: Not on file  Occupational History   Not on file  Tobacco Use   Smoking status: Never   Smokeless tobacco: Never  Vaping Use   Vaping status: Never Used  Substance and Sexual Activity   Alcohol  use: No   Drug use: No   Sexual activity: Not Currently  Other Topics Concern   Not on file  Social History Narrative   Not on file   Social Drivers of Health   Financial Resource Strain: Not on file  Food Insecurity: Not on file  Transportation Needs: Not on file  Physical Activity: Not on file  Stress: Not on file  Social Connections: Not on file  Intimate Partner Violence: Not on file    Family History  Problem Relation Age of Onset   Stroke Mother    Early death Mother    Cancer Brother     Review of Systems:  As stated in the HPI and otherwise negative.   BP (!) 150/110   Pulse 84   Ht 5\' 9"  (1.753 m)   Wt 92.4 kg   SpO2 97%   BMI 30.10 kg/m   Physical Examination: General: Well developed, well nourished, NAD  HEENT: OP clear, mucus membranes moist  SKIN: warm, dry. No rashes. Neuro: No focal deficits  Musculoskeletal: Muscle strength 5/5 all ext  Psychiatric: Mood and affect normal   Neck: No JVD, no carotid bruits, no thyromegaly, no lymphadenopathy.  Lungs:Clear bilaterally, no wheezes, rhonci, crackles Cardiovascular: Regular rate and rhythm. No murmurs, gallops or rubs. Abdomen:Soft. Bowel sounds present. Non-tender.  Extremities: No lower extremity edema. Pulses are 2 + in the bilateral DP/PT.   EKG:  EKG is ordered today. The ekg ordered today demonstrates EKG Interpretation Date/Time:  Monday January 11 2024 10:30:55 EDT Ventricular Rate:  96 PR Interval:  162 QRS Duration:  108 QT Interval:  356 QTC Calculation: 449 R Axis:   18  Text Interpretation: Sinus rhythm with frequent Premature ventricular complexes Confirmed by Antoinette Batman [phone]) on 01/11/2024 10:39:01  AM    Recent Labs: No results found for requested labs within last 365 days.   Lipid Panel    Component Value Date/Time   CHOL 194 06/27/2021 0918   CHOL 197 11/29/2019 0928   TRIG 107 06/27/2021 0918   HDL 50 06/27/2021 0918   HDL 51 11/29/2019 0928   CHOLHDL 3.9 06/27/2021 0918   VLDL 13 01/22/2018 0720   LDLCALC 123 (H) 06/27/2021 0918     Wt Readings from Last 3 Encounters:  01/11/24 92.4 kg  08/13/23 92.1 kg  11/26/22 93.7 kg    Assessment and Plan:   1. CAD without angina: No chest pain. Continue beta blocker. He does not tolerate statins. He is off of ASA since he is on Eliquis .   2. HTN: He reports white coat HTN. BP is controlled at home. Continue Coreg  and Cozaar .   3. Hyperlipidemia: He does not tolerate statins. He does not wish to consider alternatives to statins such as Repatha or Praluent.   4. Ischemic cardiomyopathy/Chronic systolic CHF: Wt is stable. No volume overload on exam. He has not  tolerated high doses of beta blockers due to bradycardia. Continue Coreg  and Losartan . He does not Lasix . Will refill Lasix  20 mg prn today.  He was seen in the EP clinic by Dr. Kin Penner and ICD was not recommended. Discussion around the possibility of the PVCs  causing contributing to his LV systolic dysfunction. He did not tolerate amiodarone  or mexilitine. He tells me that he does not wish to try any other medications.   5. PVCs: He has been seen in the EP clinic. He did not tolerate amiodarone  or mexilitine. He is tolerating Coreg . He does not wish to pursue more testing at this time. He does not wish to go back to the EP clinic. Continue Coreg .   6. Prior stroke: Continue Eliquis  per neurology  Labs/ tests ordered today include:   Orders Placed This Encounter  Procedures   EKG 12-Lead   Disposition:   F/U with me in 12  months  Signed, Antoinette Batman, MD 01/11/2024 11:03 AM    [facility] [address] [place], [place], [place]  [address] Phone: [phone]; Fax: [phone]

## 2024-01-11 ENCOUNTER — Ambulatory Visit: Payer: PPO | Attending: Cardiovascular Disease | Admitting: Cardiovascular Disease

## 2024-01-11 ENCOUNTER — Encounter: Payer: Self-pay | Admitting: Cardiovascular Disease

## 2024-01-11 VITALS — BP 150/110 | HR 84 | Ht 69.0 in | Wt 203.8 lb

## 2024-01-11 DIAGNOSIS — I2510 Atherosclerotic heart disease of native coronary artery without angina pectoris: Secondary | ICD-10-CM | POA: Diagnosis not present

## 2024-01-11 DIAGNOSIS — I5022 Chronic systolic (congestive) heart failure: Secondary | ICD-10-CM

## 2024-01-11 DIAGNOSIS — I10 Essential (primary) hypertension: Secondary | ICD-10-CM | POA: Diagnosis not present

## 2024-01-11 DIAGNOSIS — E7800 Pure hypercholesterolemia, unspecified: Secondary | ICD-10-CM

## 2024-01-11 DIAGNOSIS — I493 Ventricular premature depolarization: Secondary | ICD-10-CM | POA: Diagnosis not present

## 2024-01-11 MED ORDER — FUROSEMIDE 20 MG PO TABS: 20.0000 mg | ORAL_TABLET | ORAL | 3 refills | Status: DC | PRN

## 2024-01-11 MED ORDER — CARVEDILOL 3.125 MG PO TABS: 3.1250 mg | ORAL_TABLET | Freq: Two times a day (BID) | ORAL | 3 refills | Status: AC

## 2024-01-11 NOTE — Patient Instructions (Addendum)
 Medication Instructions:  Your physician has recommended you make the following change in your medication:  1-Take Lasix  (furosemide ) 20 mg by mouth daily as needed.  *If you need a refill on your cardiac medications before your next appointment, please call your pharmacy*  Lab Work: If you have labs (blood work) drawn today and your tests are completely normal, you will receive your results only by: MyChart Message (if you have MyChart) OR A paper copy in the mail If you have any lab test that is abnormal or we need to change your treatment, we will call you to review the results.  Testing/Procedures: None ordered today.  Follow-Up: At [facility], you and your health needs are our priority.  As part of our continuing mission to provide you with exceptional heart care, our providers are all part of one team.  This team includes your primary Cardiologist (physician) and Advanced Practice Providers or APPs (Physician Assistants and Nurse Practitioners) who all work together to provide you with the care you need, when you need it.  Your next appointment:   1 year(s)  Provider:   Antoinette Batman, MD     We recommend signing up for the patient portal called "MyChart".  Sign up information is provided on this After Visit Summary.  MyChart is used to connect with patients for Virtual Visits (Telemedicine).  Patients are able to view lab/test results, encounter notes, upcoming appointments, etc.  Non-urgent messages can be sent to your provider as well.   To learn more about what you can do with MyChart, go to [email or web address].   Other Instructions       1st Floor: - Lobby - Registration  - Pharmacy  - Lab - Cafe  2nd Floor: - PV Lab - Diagnostic Testing (echo, CT, nuclear med)  3rd Floor: - Vacant  4th Floor: - TCTS (cardiothoracic surgery) - AFib Clinic - Structural Heart Clinic - Vascular Surgery  - Vascular Ultrasound  5th Floor: - HeartCare  Cardiology (general and EP) - Clinical Pharmacy for coumadin, hypertension, lipid, weight-loss medications, and med management appointments    Valet parking services will be available as well.

## 2024-01-20 DIAGNOSIS — R001 Bradycardia, unspecified: Secondary | ICD-10-CM | POA: Diagnosis not present

## 2024-01-20 DIAGNOSIS — N401 Enlarged prostate with lower urinary tract symptoms: Secondary | ICD-10-CM | POA: Diagnosis not present

## 2024-01-20 DIAGNOSIS — I479 Paroxysmal tachycardia, unspecified: Secondary | ICD-10-CM | POA: Diagnosis not present

## 2024-01-20 DIAGNOSIS — I130 Hypertensive heart and chronic kidney disease with heart failure and stage 1 through stage 4 chronic kidney disease, or unspecified chronic kidney disease: Secondary | ICD-10-CM | POA: Diagnosis not present

## 2024-01-20 DIAGNOSIS — R972 Elevated prostate specific antigen [PSA]: Secondary | ICD-10-CM | POA: Diagnosis not present

## 2024-01-20 DIAGNOSIS — I5022 Chronic systolic (congestive) heart failure: Secondary | ICD-10-CM | POA: Diagnosis not present

## 2024-01-20 DIAGNOSIS — I25118 Atherosclerotic heart disease of native coronary artery with other forms of angina pectoris: Secondary | ICD-10-CM | POA: Diagnosis not present

## 2024-01-20 DIAGNOSIS — E038 Other specified hypothyroidism: Secondary | ICD-10-CM | POA: Diagnosis not present

## 2024-01-20 DIAGNOSIS — I10 Essential (primary) hypertension: Secondary | ICD-10-CM | POA: Diagnosis not present

## 2024-01-20 DIAGNOSIS — I509 Heart failure, unspecified: Secondary | ICD-10-CM | POA: Diagnosis not present

## 2024-01-20 DIAGNOSIS — I6930 Unspecified sequelae of cerebral infarction: Secondary | ICD-10-CM | POA: Diagnosis not present

## 2024-01-20 DIAGNOSIS — F0390 Unspecified dementia without behavioral disturbance: Secondary | ICD-10-CM | POA: Diagnosis not present

## 2024-01-27 ENCOUNTER — Telehealth (INDEPENDENT_AMBULATORY_CARE_PROVIDER_SITE_OTHER): Payer: Self-pay | Admitting: Otolaryngology

## 2024-01-27 NOTE — Telephone Encounter (Signed)
 LVM to call to r/s appt - Patel not in office 01/27/2024

## 2024-02-03 ENCOUNTER — Ambulatory Visit (INDEPENDENT_AMBULATORY_CARE_PROVIDER_SITE_OTHER): Admitting: Audiology

## 2024-02-03 ENCOUNTER — Ambulatory Visit (INDEPENDENT_AMBULATORY_CARE_PROVIDER_SITE_OTHER): Admitting: Otolaryngology

## 2024-02-08 ENCOUNTER — Ambulatory Visit (INDEPENDENT_AMBULATORY_CARE_PROVIDER_SITE_OTHER): Payer: PPO | Admitting: Audiology

## 2024-02-08 ENCOUNTER — Ambulatory Visit (INDEPENDENT_AMBULATORY_CARE_PROVIDER_SITE_OTHER): Payer: PPO | Admitting: Otolaryngology

## 2024-06-08 ENCOUNTER — Other Ambulatory Visit: Payer: Self-pay | Admitting: Cardiovascular Disease

## 2024-07-15 DIAGNOSIS — E038 Other specified hypothyroidism: Secondary | ICD-10-CM | POA: Diagnosis not present

## 2024-07-15 DIAGNOSIS — I5022 Chronic systolic (congestive) heart failure: Secondary | ICD-10-CM | POA: Diagnosis not present

## 2024-07-15 DIAGNOSIS — Z125 Encounter for screening for malignant neoplasm of prostate: Secondary | ICD-10-CM | POA: Diagnosis not present

## 2024-07-15 DIAGNOSIS — R7303 Prediabetes: Secondary | ICD-10-CM | POA: Diagnosis not present

## 2024-07-15 DIAGNOSIS — N1831 Chronic kidney disease, stage 3a: Secondary | ICD-10-CM | POA: Diagnosis not present

## 2024-07-15 DIAGNOSIS — I130 Hypertensive heart and chronic kidney disease with heart failure and stage 1 through stage 4 chronic kidney disease, or unspecified chronic kidney disease: Secondary | ICD-10-CM | POA: Diagnosis not present

## 2024-07-22 DIAGNOSIS — R82998 Other abnormal findings in urine: Secondary | ICD-10-CM | POA: Diagnosis not present

## 2024-07-22 DIAGNOSIS — F0390 Unspecified dementia without behavioral disturbance: Secondary | ICD-10-CM | POA: Diagnosis not present

## 2024-07-22 DIAGNOSIS — E038 Other specified hypothyroidism: Secondary | ICD-10-CM | POA: Diagnosis not present

## 2024-07-22 DIAGNOSIS — I130 Hypertensive heart and chronic kidney disease with heart failure and stage 1 through stage 4 chronic kidney disease, or unspecified chronic kidney disease: Secondary | ICD-10-CM | POA: Diagnosis not present

## 2024-07-22 DIAGNOSIS — I6930 Unspecified sequelae of cerebral infarction: Secondary | ICD-10-CM | POA: Diagnosis not present

## 2024-07-22 DIAGNOSIS — N1831 Chronic kidney disease, stage 3a: Secondary | ICD-10-CM | POA: Diagnosis not present

## 2024-07-22 DIAGNOSIS — R001 Bradycardia, unspecified: Secondary | ICD-10-CM | POA: Diagnosis not present

## 2024-07-22 DIAGNOSIS — C9590 Leukemia, unspecified not having achieved remission: Secondary | ICD-10-CM | POA: Diagnosis not present

## 2024-07-22 DIAGNOSIS — I5022 Chronic systolic (congestive) heart failure: Secondary | ICD-10-CM | POA: Diagnosis not present

## 2024-07-22 DIAGNOSIS — Z0000 Encounter for general adult medical examination without abnormal findings: Secondary | ICD-10-CM | POA: Diagnosis not present

## 2024-07-22 DIAGNOSIS — I25118 Atherosclerotic heart disease of native coronary artery with other forms of angina pectoris: Secondary | ICD-10-CM | POA: Diagnosis not present

## 2024-07-22 DIAGNOSIS — I10 Essential (primary) hypertension: Secondary | ICD-10-CM | POA: Diagnosis not present

## 2024-07-22 DIAGNOSIS — R972 Elevated prostate specific antigen [PSA]: Secondary | ICD-10-CM | POA: Diagnosis not present

## 2024-08-05 ENCOUNTER — Other Ambulatory Visit: Payer: Self-pay | Admitting: Cardiovascular Disease

## 2024-08-05 DIAGNOSIS — Z8673 Personal history of transient ischemic attack (TIA), and cerebral infarction without residual deficits: Secondary | ICD-10-CM

## 2024-08-05 DIAGNOSIS — I10 Essential (primary) hypertension: Secondary | ICD-10-CM

## 2024-08-05 DIAGNOSIS — I513 Intracardiac thrombosis, not elsewhere classified: Secondary | ICD-10-CM

## 2024-08-05 MED ORDER — APIXABAN 5 MG PO TABS: 5.0000 mg | ORAL_TABLET | Freq: Two times a day (BID) | ORAL | 1 refills | Status: AC

## 2024-08-05 NOTE — Telephone Encounter (Signed)
*  STAT* If patient is at the pharmacy, call can be transferred to refill team.   1. Which medications need to be refilled? (please list name of each medication and dose if known) apixaban  (ELIQUIS ) 5 MG TABS tablet [[id number]]    2. Would you like to learn more about the convenience, safety, & potential cost savings by using the [facility]? Na     3. Are you open to using the [facility] (Type [facility]. Na   4. Which pharmacy/location (including street and city if local pharmacy) is medication to be sent to? [facility] [address]    5. Do they need a 30 day or 90 day supply? 90

## 2024-08-05 NOTE — Telephone Encounter (Signed)
 Pt and wife both, have been made aware that pt needs updated lab work before can refill Eliquis . Indication is:  CVA, so will fwd to the Anticoag pool to take care of.  Lab is ordered and released.

## 2024-08-17 ENCOUNTER — Ambulatory Visit (INDEPENDENT_AMBULATORY_CARE_PROVIDER_SITE_OTHER): Admitting: Otolaryngology

## 2024-08-17 ENCOUNTER — Encounter (INDEPENDENT_AMBULATORY_CARE_PROVIDER_SITE_OTHER): Payer: Self-pay | Admitting: Otolaryngology

## 2024-08-17 ENCOUNTER — Ambulatory Visit (INDEPENDENT_AMBULATORY_CARE_PROVIDER_SITE_OTHER): Admitting: Audiology

## 2024-08-17 VITALS — BP 181/102 | HR 79 | Ht 69.0 in | Wt 205.0 lb

## 2024-08-17 DIAGNOSIS — H903 Sensorineural hearing loss, bilateral: Secondary | ICD-10-CM

## 2024-08-17 DIAGNOSIS — H9122 Sudden idiopathic hearing loss, left ear: Secondary | ICD-10-CM

## 2024-08-17 NOTE — Progress Notes (Signed)
 Dear Dr. Valentin, Here is my assessment for our mutual patient, Patrick Maxwell. Thank you for allowing me the opportunity to care for your patient. Please do not hesitate to contact me should you have any other questions. Sincerely, Dr. Eldora Blanch  Otolaryngology Clinic Note Referring provider: Dr. Valentin HPI:  Patrick Maxwell is a 89 y.o. male kindly referred by Dr. Valentin for evaluation of hearing loss  Initial visit (07/2023): Patient reports: he has overall had good hearing, but has noted decline for last several years. He did get hearing aids last June for hearing loss, but it has declined since then. He reports that his hearing has not gone down suddenly, but wife reports that she has noted a decline about 10 days ago, especially on left. He, however, does not report that it was sudden. He now has trouble even with hearing aids in. Generally, Right ear is worse than left.   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  --------------------------------------------------------- 08/17/2024 No hearing change, no ear infection, pain, fullness, vertigo. Stable tinnitus. He did get an audiogram   Personal or FHx of bleeding dz or anesthesia difficulty: no  AP/AC: Eliquis   Quite mobile Tobacco: no. Occupation: Prior naval architect. Lives in [place], [place] with Wife.  PMHx: HTN, Heart Failure, Stroke (on Eliquis ), PVC, CAD (s/p stents)  Independent Review of Additional Tests or Records:  Referral notes reviewed [facility] for prior audio (12/2022): There does appear to be an asymmetry in his hearing on this audio with AD worse than AS, with small  (<10dB ABG) in low frequencies; WRT 56% AD at 85 w/masking at 45; WRT AS 60% at 75 dB   Audio today (07/2023): B/l type A tymp; on this audio, he has bilateral downsloping mild to profound SNHL, small AMBG at 4000 Hz (query artifact); SRT 55 dB; appears to be decline on left compared to April audiogram; right seems stable AD WRT 76% at 85 dB, masking was fair AS: 68% at 85 dB but masking not great   07/2024 Audiogram was independently reviewed and interpreted by me and it reveals - essentially stable hearing thresholds; A/A tymps; WRT % stable   SNHL= Sensorineural hearing loss   CTH 2019: well aerated mastoids and ME: no obvious bony labyrinth lesion  MRI brain 2019: no obvious large retrocochlear lesion or mastoid effusion  PMH/Meds/All/SocHx/FamHx/ROS:   Past Medical History:  Diagnosis Date   Angina decubitus    BPH with elevated PSA 01/06/2017   Has seen urology in the past, declines any treatment   Bradycardia 04/20/2017   CAD (coronary artery disease)    Cerebellar infarct [facility])    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   CHF (congestive heart failure) (HCC)    Coronary artery disease involving native coronary artery of native heart with unstable angina pectoris (HCC)    Frequent unifocal PVCs    Palpitations since early 20s, PVCs and pairs   History of CVA (cerebrovascular accident) without residual deficits 12/20/2010   CVA Nov 2007    Hypercholesteremia 01/17/2011   Currently taking red yeast rice extract    Hyperlipidemia    Hypertension    Hypertension, benign 12/20/2010   hypertension    Ischemic  cardiomyopathy    Palpitations 12/25/2015   palpitations    Prediabetes    PVC's (premature ventricular contractions) 01/23/2016   Stroke (HCC) 2009   Subclinical hypothyroidism 12/21/2015     Past Surgical History:  Procedure Laterality Date   CARDIAC CATHETERIZATION N/A 01/23/2016   Procedure: Right/Left Heart Cath and Coronary Angiography;   Surgeon: Lonni JONETTA Cash, MD;  Location: [facility] INVASIVE CV LAB;  Service: Cardiovascular;  Laterality: N/A;   CARDIAC CATHETERIZATION N/A 01/23/2016   Procedure: Coronary Stent Intervention;  Surgeon: Lonni JONETTA Cash, MD;  Location: [facility] INVASIVE CV LAB;  Service: Cardiovascular;  Laterality: N/A;   HERNIA REPAIR  06/1992   TEE WITHOUT CARDIOVERSION N/A 01/26/2018   Procedure: TRANSESOPHAGEAL ECHOCARDIOGRAM (TEE);  Surgeon: Cherrie Toribio SAUNDERS, MD;  Location: [facility] ENDOSCOPY;  Service: Cardiovascular;  Laterality: N/A;    Family History  Problem Relation Age of Onset   Stroke Mother    Early death Mother    Cancer Brother      Social Connections: Not on file      Current Outpatient Medications:    apixaban  (ELIQUIS ) 5 MG TABS tablet, Take 1 tablet (5 mg total) by mouth 2 (two) times daily., Disp: 180 tablet, Rfl: 1   Ascorbic Acid (VITAMIN C PO), Take 250 mg by mouth daily. , Disp: , Rfl:    b complex vitamins capsule, Take 1 capsule by mouth daily., Disp: , Rfl:    carvedilol  (COREG ) 3.125 MG tablet, Take 1 tablet (3.125 mg total) by mouth 2 (two) times daily with a meal., Disp: 180 tablet, Rfl: 3   Cholecalciferol  (VITAMIN D  PO), Take 2,000 Units by mouth 2 (two) times a week. , Disp: , Rfl:    Coenzyme Q10 (COQ10 PO), Take 50 mg by mouth., Disp: , Rfl:    finasteride (PROSCAR) 5 MG tablet, 1 tablet Orally Once a day; Duration: 90 days, Disp: , Rfl:    Fluorouracil 5 % SOLN, 1 Application., Disp: , Rfl:    furosemide  (LASIX ) 20 MG tablet, TAKE 1 TABLET BY MOUTH AS NEEDED, Disp: 90 tablet, Rfl: 1   losartan  (COZAAR ) 25 MG tablet, TAKE 1/2 TABLET BY MOUTH EVERY DAY, Disp: 45 tablet, Rfl: 1   Multiple Vitamin (MULTIVITAMIN) tablet, Take 1 tablet by mouth daily., Disp: , Rfl:    POTASSIUM ACETATE IV, , Disp: , Rfl:    rosuvastatin (CRESTOR) 5 MG tablet, 1 tablet Orally Nightly. If myalgias occur, hold for 1-2 weeks, then restart; Duration: 90 days, Disp: , Rfl:    Physical Exam:    BP (!) 181/102 (BP Location: Right Arm, Patient Position: Sitting, Cuff Size: Large)   Pulse 79   Ht 5' 9 (1.753 m)   Wt 205 lb (93 kg)   SpO2 95%   BMI 30.27 kg/m   Salient findings:  CN II-XII intact  Bilateral EAC clear and TM intact with well pneumatized middle ear spaces Weber 512: mid Rinne 512: AC > BC b/l No lesions of oral cavity/oropharynx No obviously palpable neck masses/lymphadenopathy/thyromegaly No respiratory distress or stridor  Seprately Identifiable Procedures:  None  Impression & Plans:  Rockey Guarino is a 89 y.o. male with  1. Sensorineural hearing loss, bilateral   2. Sudden left hearing loss     It is unclear if her hearing loss is sudden - he does not seem to think it is, but wife does question that. Hearing thresholds stable. MRI 2019 was reassuring, but not IAC.  We discussed options for him: Observation 2.  Imaging  He continues to wish to observe. Hearing aids helping him a lot.  F/u PRN  See below regarding exact medications prescribed this encounter including dosages and route: No orders of the defined types were placed in this encounter.     Thank you for allowing me the opportunity to care for your patient. Please do not hesitate to contact me should you have any other questions.  Sincerely, Eldora Blanch, MD Otolarynoglogist (ENT), [facility] Phone: [phone] Fax: [phone]  08/17/2024, 10:16 AM   MDM:  Level 99213 Complexity/Problems addressed: low Data complexity: low - Morbidity: low  - Prescription Drug prescribed or managed: n

## 2024-08-17 NOTE — Progress Notes (Signed)
  [address] [place], [place] [address] [phone]  Audiological Evaluation    Name: Patrick Maxwell     DOB:   Dec 30, 1930      MRN:   [id number]                                                                                     Service Date: 08/17/2024     Accompanied by: wife   Patient comes today after Dr. Tobie, ENT sent a referral for a hearing evaluation due to concerns with hearing loss.   Symptoms Yes Details  Hearing loss  [x]  08-13-2023: Right ear- Mild to profound sensorineural hearing loss from [phone] Hz, with an air-bone gap at 4000 Hz . Left ear-  Mild to profound sensorineural hearing loss from [phone] Hz, with an air-bone gap at 4000 Hz .  Tinnitus  []    Ear pain/ infections/pressure  []    Balance problems  []    Noise exposure history  []    Previous ear surgeries  []    Family history of hearing loss  []    Amplification  [x]  Has a set of hearing aids  Other  []  dementia    Otoscopy: Right ear: Clear external ear canal and notable landmarks visualized on the tympanic membrane. Left ear:  Clear external ear canal and notable landmarks visualized on the tympanic membrane.  Tympanometry: Right ear: Normal external ear canal volume with normal middle ear pressure and tympanic membrane compliance (Type A). Findings are suggestive of normal middle ear function. Left ear: Normal external ear canal volume with normal middle ear pressure and tympanic membrane compliance (Type A). Findings are suggestive of normal middle ear function.  Hearing Evaluation The hearing test results were completed under headphones and re-checked with inserts and results are deemed to be of good reliability. Test technique:  conventional    Pure tone Audiometry: Right ear- Mild to severe sensorineural hearing loss from 125 Hz - 8000 Hz. Left ear-  Mild to severe sensorineural hearing loss from 125 Hz - 8000 Hz.  Speech Audiometry: Right ear- Speech Reception  Threshold (SRT) was obtained at 55 dBHL. Left ear-Speech Reception Threshold (SRT) was obtained at 55 dBHL.   Word Recognition Score Tested using NU-6 (recorded) Right ear: 68% was obtained at a presentation level of 85 dBHL with contralateral masking which is deemed as  excellent. Left ear: 76% was obtained at a presentation level of 85 dBHL with contralateral masking which is deemed as  excellent.   Impression: There is a significant difference in pure-tone thresholds between ears, slightly worse in the right ear.   Recommendations: Follow up with ENT as scheduled for today. Return for a hearing evaluation if concerns with hearing changes arise or per MD recommendation. Recommend a hearing aid check with their audiologist or hearing aid dispenser.   Fleurette Woolbright MARIE LEROUX-MARTINEZ, AUD

## 2024-08-23 DIAGNOSIS — H40013 Open angle with borderline findings, low risk, bilateral: Secondary | ICD-10-CM | POA: Diagnosis not present

## 2024-08-23 DIAGNOSIS — H353113 Nonexudative age-related macular degeneration, right eye, advanced atrophic without subfoveal involvement: Secondary | ICD-10-CM | POA: Diagnosis not present

## 2024-08-23 DIAGNOSIS — H26492 Other secondary cataract, left eye: Secondary | ICD-10-CM | POA: Diagnosis not present

## 2024-08-23 DIAGNOSIS — H2511 Age-related nuclear cataract, right eye: Secondary | ICD-10-CM | POA: Diagnosis not present

## 2024-08-23 DIAGNOSIS — H353124 Nonexudative age-related macular degeneration, left eye, advanced atrophic with subfoveal involvement: Secondary | ICD-10-CM | POA: Diagnosis not present

## 2024-08-31 ENCOUNTER — Encounter: Payer: Self-pay | Admitting: Audiology
